# Patient Record
Sex: Female | Born: 1955 | Race: Black or African American | Hispanic: No | Marital: Married | Smoking: Never smoker
Health system: Southern US, Community
[De-identification: ages and names within clinical notes are randomized; demographics above are authoritative.]

## PROBLEM LIST (undated history)

## (undated) DIAGNOSIS — R350 Frequency of micturition: Secondary | ICD-10-CM

## (undated) DIAGNOSIS — N898 Other specified noninflammatory disorders of vagina: Secondary | ICD-10-CM

## (undated) DIAGNOSIS — D219 Benign neoplasm of connective and other soft tissue, unspecified: Secondary | ICD-10-CM

## (undated) DIAGNOSIS — R21 Rash and other nonspecific skin eruption: Secondary | ICD-10-CM

## (undated) DIAGNOSIS — I10 Essential (primary) hypertension: Secondary | ICD-10-CM

## (undated) HISTORY — DX: Other specified noninflammatory disorders of vagina: N89.8

## (undated) HISTORY — PX: HYSTEROSCOPY: SHX211

## (undated) HISTORY — DX: Benign neoplasm of connective and other soft tissue, unspecified: D21.9

---

## 1997-08-19 ENCOUNTER — Ambulatory Visit (HOSPITAL_COMMUNITY): Admission: RE | Admit: 1997-08-19 | Discharge: 1997-08-19 | Payer: Self-pay | Admitting: Obstetrics & Gynecology

## 1997-08-26 ENCOUNTER — Other Ambulatory Visit: Admission: RE | Admit: 1997-08-26 | Discharge: 1997-08-26 | Payer: Self-pay | Admitting: Obstetrics & Gynecology

## 1998-08-15 ENCOUNTER — Encounter: Payer: Self-pay | Admitting: Obstetrics & Gynecology

## 1998-08-15 ENCOUNTER — Ambulatory Visit (HOSPITAL_COMMUNITY): Admission: RE | Admit: 1998-08-15 | Discharge: 1998-08-15 | Payer: Self-pay | Admitting: Obstetrics & Gynecology

## 1998-10-16 ENCOUNTER — Other Ambulatory Visit: Admission: RE | Admit: 1998-10-16 | Discharge: 1998-10-16 | Payer: Self-pay | Admitting: Obstetrics & Gynecology

## 1998-11-24 ENCOUNTER — Encounter (HOSPITAL_BASED_OUTPATIENT_CLINIC_OR_DEPARTMENT_OTHER): Payer: Self-pay | Admitting: General Surgery

## 1998-11-26 ENCOUNTER — Ambulatory Visit (HOSPITAL_COMMUNITY): Admission: RE | Admit: 1998-11-26 | Discharge: 1998-11-27 | Payer: Self-pay | Admitting: General Surgery

## 1998-11-26 ENCOUNTER — Encounter (INDEPENDENT_AMBULATORY_CARE_PROVIDER_SITE_OTHER): Payer: Self-pay | Admitting: *Deleted

## 1998-11-26 ENCOUNTER — Encounter (HOSPITAL_BASED_OUTPATIENT_CLINIC_OR_DEPARTMENT_OTHER): Payer: Self-pay | Admitting: General Surgery

## 1999-01-26 HISTORY — PX: CHOLECYSTECTOMY: SHX55

## 1999-08-14 ENCOUNTER — Ambulatory Visit (HOSPITAL_COMMUNITY): Admission: RE | Admit: 1999-08-14 | Discharge: 1999-08-14 | Payer: Self-pay | Admitting: Obstetrics & Gynecology

## 1999-08-14 ENCOUNTER — Encounter: Payer: Self-pay | Admitting: Obstetrics & Gynecology

## 1999-10-30 ENCOUNTER — Other Ambulatory Visit: Admission: RE | Admit: 1999-10-30 | Discharge: 1999-10-30 | Payer: Self-pay | Admitting: Obstetrics & Gynecology

## 2000-08-15 ENCOUNTER — Ambulatory Visit (HOSPITAL_COMMUNITY): Admission: RE | Admit: 2000-08-15 | Discharge: 2000-08-15 | Payer: Self-pay | Admitting: Obstetrics and Gynecology

## 2000-08-15 ENCOUNTER — Encounter: Payer: Self-pay | Admitting: Obstetrics and Gynecology

## 2000-10-04 ENCOUNTER — Other Ambulatory Visit: Admission: RE | Admit: 2000-10-04 | Discharge: 2000-10-04 | Payer: Self-pay | Admitting: Obstetrics and Gynecology

## 2001-08-16 ENCOUNTER — Ambulatory Visit (HOSPITAL_COMMUNITY): Admission: RE | Admit: 2001-08-16 | Discharge: 2001-08-16 | Payer: Self-pay | Admitting: Obstetrics and Gynecology

## 2001-08-16 ENCOUNTER — Encounter: Payer: Self-pay | Admitting: Obstetrics and Gynecology

## 2001-11-20 ENCOUNTER — Other Ambulatory Visit: Admission: RE | Admit: 2001-11-20 | Discharge: 2001-11-20 | Payer: Self-pay | Admitting: Obstetrics and Gynecology

## 2002-08-21 ENCOUNTER — Ambulatory Visit (HOSPITAL_COMMUNITY): Admission: RE | Admit: 2002-08-21 | Discharge: 2002-08-21 | Payer: Self-pay | Admitting: Obstetrics and Gynecology

## 2002-08-21 ENCOUNTER — Encounter: Payer: Self-pay | Admitting: Obstetrics and Gynecology

## 2002-12-11 ENCOUNTER — Other Ambulatory Visit: Admission: RE | Admit: 2002-12-11 | Discharge: 2002-12-11 | Payer: Self-pay | Admitting: Obstetrics and Gynecology

## 2003-08-23 ENCOUNTER — Ambulatory Visit (HOSPITAL_COMMUNITY): Admission: RE | Admit: 2003-08-23 | Discharge: 2003-08-23 | Payer: Self-pay | Admitting: Obstetrics and Gynecology

## 2004-01-03 ENCOUNTER — Other Ambulatory Visit: Admission: RE | Admit: 2004-01-03 | Discharge: 2004-01-03 | Payer: Self-pay | Admitting: Obstetrics and Gynecology

## 2004-01-26 HISTORY — PX: COLONOSCOPY: SHX174

## 2004-08-26 ENCOUNTER — Ambulatory Visit (HOSPITAL_COMMUNITY): Admission: RE | Admit: 2004-08-26 | Discharge: 2004-08-26 | Payer: Self-pay | Admitting: Obstetrics and Gynecology

## 2005-01-05 ENCOUNTER — Other Ambulatory Visit: Admission: RE | Admit: 2005-01-05 | Discharge: 2005-01-05 | Payer: Self-pay | Admitting: Obstetrics and Gynecology

## 2005-08-27 ENCOUNTER — Ambulatory Visit (HOSPITAL_COMMUNITY): Admission: RE | Admit: 2005-08-27 | Discharge: 2005-08-27 | Payer: Self-pay | Admitting: Obstetrics and Gynecology

## 2006-08-31 ENCOUNTER — Ambulatory Visit (HOSPITAL_COMMUNITY): Admission: RE | Admit: 2006-08-31 | Discharge: 2006-08-31 | Payer: Self-pay | Admitting: Obstetrics and Gynecology

## 2007-01-28 ENCOUNTER — Emergency Department (HOSPITAL_COMMUNITY): Admission: EM | Admit: 2007-01-28 | Discharge: 2007-01-28 | Payer: Self-pay | Admitting: Family Medicine

## 2007-09-05 ENCOUNTER — Ambulatory Visit (HOSPITAL_COMMUNITY): Admission: RE | Admit: 2007-09-05 | Discharge: 2007-09-05 | Payer: Self-pay | Admitting: Obstetrics and Gynecology

## 2008-09-10 ENCOUNTER — Ambulatory Visit (HOSPITAL_COMMUNITY): Admission: RE | Admit: 2008-09-10 | Discharge: 2008-09-10 | Payer: Self-pay | Admitting: Family Medicine

## 2008-09-13 ENCOUNTER — Encounter: Admission: RE | Admit: 2008-09-13 | Discharge: 2008-09-13 | Payer: Self-pay | Admitting: Family Medicine

## 2009-01-21 ENCOUNTER — Ambulatory Visit: Payer: Self-pay | Admitting: Vascular Surgery

## 2009-01-21 ENCOUNTER — Ambulatory Visit: Admission: RE | Admit: 2009-01-21 | Discharge: 2009-01-21 | Payer: Self-pay | Admitting: Obstetrics and Gynecology

## 2009-01-21 ENCOUNTER — Encounter (INDEPENDENT_AMBULATORY_CARE_PROVIDER_SITE_OTHER): Payer: Self-pay | Admitting: Obstetrics and Gynecology

## 2009-01-25 HISTORY — PX: TUMOR REMOVAL: SHX12

## 2009-03-19 ENCOUNTER — Ambulatory Visit (HOSPITAL_COMMUNITY): Admission: RE | Admit: 2009-03-19 | Discharge: 2009-03-19 | Payer: Self-pay | Admitting: Obstetrics and Gynecology

## 2009-09-15 ENCOUNTER — Ambulatory Visit (HOSPITAL_COMMUNITY): Admission: RE | Admit: 2009-09-15 | Discharge: 2009-09-15 | Payer: Self-pay | Admitting: Family Medicine

## 2010-02-16 ENCOUNTER — Encounter: Payer: Self-pay | Admitting: Family Medicine

## 2010-04-15 LAB — BASIC METABOLIC PANEL
CO2: 28 mEq/L (ref 19–32)
Calcium: 9.5 mg/dL (ref 8.4–10.5)
Chloride: 102 mEq/L (ref 96–112)

## 2010-04-15 LAB — CBC: RDW: 13.5 % (ref 11.5–15.5)

## 2010-08-27 ENCOUNTER — Other Ambulatory Visit (HOSPITAL_COMMUNITY): Payer: Self-pay | Admitting: Obstetrics and Gynecology

## 2010-08-27 DIAGNOSIS — Z1231 Encounter for screening mammogram for malignant neoplasm of breast: Secondary | ICD-10-CM

## 2010-09-17 ENCOUNTER — Ambulatory Visit (HOSPITAL_COMMUNITY): Payer: Self-pay

## 2010-09-22 ENCOUNTER — Ambulatory Visit (HOSPITAL_COMMUNITY)
Admission: RE | Admit: 2010-09-22 | Discharge: 2010-09-22 | Disposition: A | Discharge status: Home / Self Care | Payer: Managed Care, Other (non HMO) | Source: Ambulatory Visit | Attending: Obstetrics and Gynecology | Admitting: Obstetrics and Gynecology

## 2010-09-22 DIAGNOSIS — Z1231 Encounter for screening mammogram for malignant neoplasm of breast: Secondary | ICD-10-CM | POA: Insufficient documentation

## 2010-10-15 LAB — CULTURE, ROUTINE-ABSCESS

## 2010-12-25 ENCOUNTER — Encounter (INDEPENDENT_AMBULATORY_CARE_PROVIDER_SITE_OTHER): Payer: Self-pay | Admitting: Surgery

## 2010-12-28 ENCOUNTER — Ambulatory Visit (INDEPENDENT_AMBULATORY_CARE_PROVIDER_SITE_OTHER): Payer: Self-pay | Admitting: General Surgery

## 2010-12-28 ENCOUNTER — Encounter (INDEPENDENT_AMBULATORY_CARE_PROVIDER_SITE_OTHER): Payer: Self-pay | Admitting: Surgery

## 2010-12-28 ENCOUNTER — Ambulatory Visit (INDEPENDENT_AMBULATORY_CARE_PROVIDER_SITE_OTHER): Payer: Managed Care, Other (non HMO) | Admitting: Surgery

## 2010-12-28 VITALS — BP 120/78 | HR 68 | Temp 98.2°F | Resp 18 | Ht 62.0 in | Wt 200.8 lb

## 2010-12-28 DIAGNOSIS — D1779 Benign lipomatous neoplasm of other sites: Secondary | ICD-10-CM

## 2010-12-28 DIAGNOSIS — D1720 Benign lipomatous neoplasm of skin and subcutaneous tissue of unspecified limb: Secondary | ICD-10-CM | POA: Insufficient documentation

## 2010-12-28 DIAGNOSIS — D171 Benign lipomatous neoplasm of skin and subcutaneous tissue of trunk: Secondary | ICD-10-CM | POA: Insufficient documentation

## 2010-12-28 NOTE — Progress Notes (Signed)
Patient ID: Chelsea Landry, female   DOB: 03-31-1955, 55 y.o.   MRN: [id number]  Chief Complaint  Patient presents with  . Lipoma    back and both wrist    HPI Chelsea Landry is a 55 y.o. female.  Referred by Dr. Catha Gosselin for evaluation of lipomas on both forearms and back HPI 55 yo female in good health who presents with a slowly enlarging masses on both forearms on the dorsal surfaces just above the wrist. This has been present for at least 3 years an enlarging very slowly. No pain is associated with these at this time. She also has noticed a small mass on her mid back for the last year. This is also enlarged slightly. She is referred for surgical evaluation. PMH: Hypertension Past Surgical History  Procedure Date  . Cholecystectomy 2001  . 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 No pertinent family history.  Social History History  Substance Use Topics  . Smoking status: Never Smoker   . Smokeless tobacco: Not on file  . Alcohol Use: No    No Known Allergies  Current Outpatient Prescriptions  Medication Sig Dispense Refill  . losartan-hydrochlorothiazide (HYZAAR) 50-12.5 MG per tablet         Review of Systems Review of Systems ROS otherwise negative Blood pressure 120/78, pulse 68, temperature 98.2 F (36.8 C), resp. rate 18, height 5\' 2"  (1.575 m), weight 200 lb 12.8 oz (91.082 kg).  Physical Exam Physical Exam WDWN in NAD Both forearms - 4x3 cm subcutaneous masses on dorsal surfaces, just proximal to wrists.  Symmetric.  Well-demarcated. No tenderness. 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  The patient prefers to leave these alone for now, which is very reasonable.  She will call us as needed if these enlarge or become uncomfortable.         Kynslei Art K. 12/28/2010, 2:12 PM

## 2010-12-28 NOTE — Patient Instructions (Signed)
If these enlarge or become uncomfortable, please call us for another appointment and we will discuss removal.

## 2010-12-30 ENCOUNTER — Other Ambulatory Visit: Payer: Self-pay

## 2010-12-30 ENCOUNTER — Encounter (HOSPITAL_BASED_OUTPATIENT_CLINIC_OR_DEPARTMENT_OTHER): Payer: Self-pay | Admitting: *Deleted

## 2010-12-30 ENCOUNTER — Emergency Department (HOSPITAL_BASED_OUTPATIENT_CLINIC_OR_DEPARTMENT_OTHER)
Admission: EM | Admit: 2010-12-30 | Discharge: 2010-12-30 | Disposition: A | Discharge status: Home / Self Care | Payer: Managed Care, Other (non HMO) | Attending: Emergency Medicine | Admitting: Emergency Medicine

## 2010-12-30 DIAGNOSIS — I10 Essential (primary) hypertension: Secondary | ICD-10-CM | POA: Insufficient documentation

## 2010-12-30 DIAGNOSIS — R42 Dizziness and giddiness: Secondary | ICD-10-CM

## 2010-12-30 DIAGNOSIS — E876 Hypokalemia: Secondary | ICD-10-CM

## 2010-12-30 DIAGNOSIS — R55 Syncope and collapse: Secondary | ICD-10-CM

## 2010-12-30 HISTORY — DX: Essential (primary) hypertension: I10

## 2010-12-30 LAB — CBC
Hemoglobin: 12.7 g/dL (ref 12.0–15.0)
MCV: 87.9 fL (ref 78.0–100.0)
Platelets: 249 10*3/uL (ref 150–400)
RBC: 4.28 MIL/uL (ref 3.87–5.11)
WBC: 15.7 10*3/uL — ABNORMAL HIGH (ref 4.0–10.5)

## 2010-12-30 LAB — BASIC METABOLIC PANEL
Calcium: 9 mg/dL (ref 8.4–10.5)
Creatinine, Ser: 0.9 mg/dL (ref 0.50–1.10)
GFR calc non Af Amer: 70 mL/min — ABNORMAL LOW (ref >90)
Glucose, Bld: 141 mg/dL — ABNORMAL HIGH (ref 70–99)
Potassium: 3.1 mEq/L — ABNORMAL LOW (ref 3.5–5.1)

## 2010-12-30 MED ORDER — POTASSIUM CHLORIDE CRYS ER 20 MEQ PO TBCR
40.0000 meq | EXTENDED_RELEASE_TABLET | Freq: Once | ORAL | Status: AC
  Administered 2010-12-30: 40 meq via ORAL
  Filled 2010-12-30: qty 2

## 2010-12-30 MED ORDER — POTASSIUM CHLORIDE ER 10 MEQ PO TBCR: 10.0000 meq | EXTENDED_RELEASE_TABLET | Freq: Two times a day (BID) | ORAL | Status: DC

## 2010-12-30 NOTE — ED Notes (Signed)
Pt to room 5 by ems via stretcher. Pt reports giving blood today at noon, drank a small cup of juice afterwards, but started to feel dizzy when she got back to work. ems reports pt was diaphoretic and pale on their arrival. Iv access obtained by ems, 500cc bolus fluids given by ems, pt states she is no longer dizzy, but feels weak and "my ears are ringing...".

## 2010-12-30 NOTE — ED Notes (Signed)
Patient states she gave blood this morning at 11:30 A.M. Patient is sluggish and weak during time I took vitals.

## 2010-12-30 NOTE — ED Provider Notes (Signed)
History     CSN: [id number] Arrival date & time: 12/30/2010  1:54 PM   First MD Initiated Contact with Patient 12/30/10 1455      Chief Complaint  Patient presents with  . Dizziness   patient brought in by EMS. Gave blood today at noon and had some dizziness afterwards. IV access obtained by EMS. 500 cc bolus given. Apparently, was feeling better after arrival.  Patient reported a heavy feeling in her legs. She had no chest pain or palpitations. No abdominal pain or back pain. She denied any vomiting or any focal weakness. She states she has given blood before. Family's describes a near syncopal episode. She's had no new medications. No recent illnesses. She also had some ringing in her ear, but feels much better at this time. Vital signs were stable upon arrival  (Consider location/radiation/quality/duration/timing/severity/associated sxs/prior treatment) HPI  Past Medical History  Diagnosis Date  . Hypertension     Past Surgical History  Procedure Date  . Cholecystectomy 2001  . 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 No pertinent family history.  History  Substance Use Topics  . Smoking status: Never Smoker   . Smokeless tobacco: Not on file  . Alcohol Use: No    OB History    Grav Para Term Preterm Abortions TAB SAB Ect Mult Living                  Review of Systems  All other systems reviewed and are negative.    Allergies  Review of patient's allergies indicates no known allergies.  Home Medications   Current Outpatient Rx  Name Route Sig Dispense Refill  . CALCIUM CITRATE-VITAMIN D 315-200 MG-UNIT PO TABS Oral Take 1 tablet by mouth daily.      Marland Kitchen LOSARTAN POTASSIUM-HCTZ 50-12.5 MG PO TABS Oral Take 1 tablet by mouth daily.      Marland Kitchen ONE-DAILY MULTI VITAMINS PO TABS Oral Take 1 tablet by mouth daily.      Marland Kitchen NAPROXEN SODIUM 220 MG PO TABS Oral Take 440 mg by mouth 2 (two) times daily as needed. For pain     . LOSARTAN POTASSIUM-HCTZ  50-12.5 MG PO TABS        BP 111/65  Pulse 71  Temp(Src) 97.7 F (36.5 C) (Oral)  Resp 20  Ht 5\' 2"  (1.575 m)  Wt 200 lb (90.719 kg)  BMI 36.58 kg/m2  SpO2 100%  Physical Exam  Nursing note and vitals reviewed. Constitutional: She is oriented to person, place, and time. She appears well-developed and well-nourished.  HENT:  Head: Normocephalic and atraumatic.  Eyes: Conjunctivae and EOM are normal. Pupils are equal, round, and reactive to light.  Neck: Neck supple.  Cardiovascular: Normal rate and regular rhythm.  Exam reveals no gallop and no friction rub.   No murmur heard. Pulmonary/Chest: Breath sounds normal. She has no wheezes. She has no rales. She exhibits no tenderness.  Abdominal: Soft. Bowel sounds are normal. She exhibits no distension. There is no tenderness. There is no rebound and no guarding.  Musculoskeletal: Normal range of motion.  Neurological: She is alert and oriented to person, place, and time. She has normal reflexes. No cranial nerve deficit. Coordination normal.       Awake alert, and oriented. Grip strength equal and symmetric. No pronator drift. No facial droop.  Skin: Skin is warm and dry. No rash noted.  Psychiatric: She has a normal mood and affect.  ED Course  Procedures (including critical care time)  Labs Reviewed  CBC - Abnormal; Notable for the following:    WBC 15.7 (*)    All other components within normal limits  BASIC METABOLIC PANEL - Abnormal; Notable for the following:    Potassium 3.1 (*)    Glucose, Bld 141 (*)    GFR calc non Af Amer 70 (*)    GFR calc Af Amer 81 (*)    All other components within normal limits   No results found.   No diagnosis found.    MDM  Pt is seen and examined;  Initial history and physical completed.  Will follow.    4:10 PM Dizziness and near syncopal episode after giving blood. Today. No focal deficits, neuro exam is normal. Likely vasovagal reaction. Will check EKG, CBC, basic  metabolic panel, and observe the patient on the cardiac monitor.    4:10 PM Results for orders placed during the hospital encounter of 12/30/10  CBC      Component Value Range   WBC 15.7 (*) 4.0 - 10.5 (K/uL)   RBC 4.28  3.87 - 5.11 (MIL/uL)   Hemoglobin 12.7  12.0 - 15.0 (g/dL)   HCT 78.2  95.6 - 21.3 (%)   MCV 87.9  78.0 - 100.0 (fL)   MCH 29.7  26.0 - 34.0 (pg)   MCHC 33.8  30.0 - 36.0 (g/dL)   RDW 08.6  57.8 - 46.9 (%)   Platelets 249  150 - 400 (K/uL)  BASIC METABOLIC PANEL      Component Value Range   Sodium 139  135 - 145 (mEq/L)   Potassium 3.1 (*) 3.5 - 5.1 (mEq/L)   Chloride 101  96 - 112 (mEq/L)   CO2 26  19 - 32 (mEq/L)   Glucose, Bld 141 (*) 70 - 99 (mg/dL)   BUN 15  6 - 23 (mg/dL)   Creatinine, Ser 6.29  0.50 - 1.10 (mg/dL)   Calcium 9.0  8.4 - 52.8 (mg/dL)   GFR calc non Af Amer 70 (*) >90 (mL/min)   GFR calc Af Amer 81 (*) >90 (mL/min)   No results found.   Date: 12/30/2010  Rate: 80  Rhythm: normal sinus rhythm  QRS Axis: normal  Intervals: normal  ST/T Wave abnormalities: nonspecific T wave changes  Conduction Disutrbances:none  Narrative Interpretation:   Old EKG Reviewed: unchanged Comp. To Nov 24, 1998 No significant changes  Results for orders placed during the hospital encounter of 12/30/10  CBC      Component Value Range   WBC 15.7 (*) 4.0 - 10.5 (K/uL)   RBC 4.28  3.87 - 5.11 (MIL/uL)   Hemoglobin 12.7  12.0 - 15.0 (g/dL)   HCT 41.3  24.4 - 01.0 (%)   MCV 87.9  78.0 - 100.0 (fL)   MCH 29.7  26.0 - 34.0 (pg)   MCHC 33.8  30.0 - 36.0 (g/dL)   RDW 27.2  53.6 - 64.4 (%)   Platelets 249  150 - 400 (K/uL)  BASIC METABOLIC PANEL      Component Value Range   Sodium 139  135 - 145 (mEq/L)   Potassium 3.1 (*) 3.5 - 5.1 (mEq/L)   Chloride 101  96 - 112 (mEq/L)   CO2 26  19 - 32 (mEq/L)   Glucose, Bld 141 (*) 70 - 99 (mg/dL)   BUN 15  6 - 23 (mg/dL)   Creatinine, Ser 0.34  0.50 - 1.10 (mg/dL)  Calcium 9.0  8.4 - 10.5 (mg/dL)   GFR calc  non Af Amer 70 (*) >90 (mL/min)   GFR calc Af Amer 81 (*) >90 (mL/min)   No results found.    Merle Whitehorn A. Patrica Duel, MD 12/30/10 [phone]

## 2010-12-30 NOTE — ED Notes (Signed)
I took second set of vitals, then took ecg, gave new and old copy to Dr. Patrica Duel. I then placed patient on monitor. Family, friends, coworkers are all crowded into room making tests and interventions difficult.

## 2011-03-10 ENCOUNTER — Ambulatory Visit (INDEPENDENT_AMBULATORY_CARE_PROVIDER_SITE_OTHER): Payer: Managed Care, Other (non HMO) | Admitting: General Surgery

## 2011-03-10 ENCOUNTER — Encounter (INDEPENDENT_AMBULATORY_CARE_PROVIDER_SITE_OTHER): Payer: Self-pay | Admitting: General Surgery

## 2011-03-10 VITALS — BP 134/92 | HR 72 | Temp 97.9°F | Resp 18 | Ht 62.0 in | Wt 200.4 lb

## 2011-03-10 DIAGNOSIS — E669 Obesity, unspecified: Secondary | ICD-10-CM

## 2011-03-10 NOTE — Progress Notes (Addendum)
Patient ID: Chelsea Landry, female   DOB: 30-Mar-1955, 56 y.o.   MRN: [id number]  Chief Complaint  Patient presents with  . New Evaluation    Lap band initial    HPI Chelsea Landry is a 56 y.o. female.  This patient presents for an initial weight loss surgery evaluation. She has a  BMI of 36 with hypertension and prediabetes. She states that she has struggled with her weight since the birth of her child and she states that she never lost her baby weight. She has tried several diets and was enrolled in a weight loss program at [facility] program but she states that this "didn't feel right" she is not really exercising. She denies any reflux. HPI  Past Medical History  Diagnosis Date  . Hypertension   . Wears glasses   . Menopause     Past Surgical History  Procedure Date  . 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 Cholecystectomy 2001    Family History  Problem Relation Age of Onset  . Leukemia Mother   . Aneurysm Father   . Cancer Brother     brain    Social History History  Substance Use Topics  . Smoking status: Never Smoker   . Smokeless tobacco: Never Used  . Alcohol Use: No    No Known Allergies  Current Outpatient Prescriptions  Medication Sig Dispense Refill  . calcium citrate-vitamin D (CITRACAL+D) 315-200 MG-UNIT per tablet Take 1 tablet by mouth daily.        Marland Kitchen losartan-hydrochlorothiazide (HYZAAR) 50-12.5 MG per tablet Take 1 tablet by mouth daily.        . Multiple Vitamin (MULTIVITAMIN) tablet Take 1 tablet by mouth daily.          Review of Systems Review of Systems All other review of systems negative or noncontributory except as stated in the HPI  Blood pressure 134/92, pulse 72, temperature 97.9 F (36.6 C), temperature source Temporal, resp. rate 18, height 5\' 2"  (1.575 m), weight 200 lb 6.4 oz (90.901 kg).  Physical Exam Physical Exam Physical Exam  Nursing note and vitals reviewed. Constitutional: She is oriented to person, place, and  time. She appears well-developed and well-nourished. No distress.  HENT:  Head: Normocephalic and atraumatic.  Mouth/Throat: No oropharyngeal exudate.  Eyes: Conjunctivae and EOM are normal. Pupils are equal, round, and reactive to light. Right eye exhibits no discharge. Left eye exhibits no discharge. No scleral icterus.  Neck: Normal range of motion. Neck supple. No tracheal deviation present.  Cardiovascular: Normal rate, regular rhythm, normal heart sounds and intact distal pulses.   Pulmonary/Chest: Effort normal and breath sounds normal. No stridor. No respiratory distress. She has no wheezes.  Abdominal: Soft. Bowel sounds are normal. She exhibits no distension and no mass. There is no tenderness. There is no rebound and no guarding.  Musculoskeletal: Normal range of motion. She exhibits no edema and no tenderness.  Neurological: She is alert and oriented to person, place, and time.  Skin: Skin is warm and dry. No rash noted. She is not diaphoretic. No erythema. No pallor.  Psychiatric: She has a normal mood and affect. Her behavior is normal. Judgment and thought content normal.    Data Reviewed   Assessment    Morbid obesity with a BMI of 36 and hypertension and prediabetes We had a long discussion regarding all of the weight loss surgery option  including continued medical weight loss and surgical options of a  lap band, the sleeve gastrectomy, and Roux-en-Y gastric bypass. We discussed the pros and cons of each procedure as well as there perioperative management. We discussed the risks. She is unsure of which procedures that she is interested in having and I recommended that she go home and do some additional research on each of the procedures and she can only back when she decides what she would like to proceed with. I think that she be a fine candidate for any of the procedures.  At least 45 minutes was spent counseling this patient.    Plan    She is going to researched the  surgical procedures and she will call back when she decides which way she would like to go.       Lodema Pilot DAVID 03/10/2011, 4:45 PM   She called back and is now interested in the sleeve gastrectomy.  Will proceed with this workup.

## 2011-03-26 ENCOUNTER — Other Ambulatory Visit (INDEPENDENT_AMBULATORY_CARE_PROVIDER_SITE_OTHER): Payer: Self-pay | Admitting: General Surgery

## 2011-03-31 ENCOUNTER — Ambulatory Visit (HOSPITAL_COMMUNITY): Payer: Managed Care, Other (non HMO)

## 2011-03-31 ENCOUNTER — Ambulatory Visit (HOSPITAL_COMMUNITY)
Admission: RE | Admit: 2011-03-31 | Discharge: 2011-03-31 | Disposition: A | Discharge status: Home / Self Care | Payer: Managed Care, Other (non HMO) | Source: Ambulatory Visit | Attending: General Surgery | Admitting: General Surgery

## 2011-03-31 DIAGNOSIS — K449 Diaphragmatic hernia without obstruction or gangrene: Secondary | ICD-10-CM | POA: Insufficient documentation

## 2011-03-31 DIAGNOSIS — I10 Essential (primary) hypertension: Secondary | ICD-10-CM | POA: Insufficient documentation

## 2011-04-07 ENCOUNTER — Encounter: Payer: Managed Care, Other (non HMO) | Attending: General Surgery | Admitting: *Deleted

## 2011-04-07 ENCOUNTER — Encounter: Payer: Self-pay | Admitting: *Deleted

## 2011-04-07 DIAGNOSIS — Z01818 Encounter for other preprocedural examination: Secondary | ICD-10-CM | POA: Insufficient documentation

## 2011-04-07 DIAGNOSIS — Z713 Dietary counseling and surveillance: Secondary | ICD-10-CM | POA: Insufficient documentation

## 2011-04-07 NOTE — Progress Notes (Signed)
  Pre-Op Assessment Visit:  Pre-Operative LAGB or Gastric Sleeve Surgery  Medical Nutrition Therapy:  Appt start time: 0830 end time:  0930.  Patient was seen on 04/07/2011 for Pre-Operative LAGB or Gastric Sleeve Nutrition Assessment. Assessment and letter of approval faxed to [facility] Bariatric Surgery Program coordinator on 04/07/2011.  Approval letter sent to [facility] and will be available in the chart under the media tab.  TANITA  BODY COMP RESULTS  04/07/11     %Fat 49.6%     FM (lbs) 98.5     FFM (lbs) 100.5     TBW (lbs) 73.5      Handouts given during visit include:  Pre-Op Goals handout  Bariatric Surgery Protein Shakes handout  Bariatric Support Group Calendar  Samples Dispersed:   Celebrate MVI [facility]): 1 ea Lot #: [id number]; Exp: 03/2012   Celebrate Calcium Plus (Cherry Tart): 1 ea Lot #: [id number]; Exp: 12/2011  Patient to call for Pre-Op and Post-Op Nutrition Education at [facility] when surgery is scheduled.

## 2011-04-07 NOTE — Patient Instructions (Addendum)
   Follow Pre-Op Nutrition Goals to prepare for Lapband/Gastric Sleeve Surgery.   Call the [facility] at [phone] once you have been given your surgery date to enrolled in the Pre-Op Nutrition Class. You will need to attend this nutrition class 3-4 weeks prior to your surgery.

## 2011-04-15 ENCOUNTER — Ambulatory Visit (HOSPITAL_COMMUNITY): Payer: Managed Care, Other (non HMO)

## 2011-04-21 ENCOUNTER — Ambulatory Visit: Payer: Managed Care, Other (non HMO) | Admitting: *Deleted

## 2011-05-05 ENCOUNTER — Other Ambulatory Visit (INDEPENDENT_AMBULATORY_CARE_PROVIDER_SITE_OTHER): Payer: Self-pay

## 2011-05-06 ENCOUNTER — Telehealth (INDEPENDENT_AMBULATORY_CARE_PROVIDER_SITE_OTHER): Payer: Self-pay

## 2011-05-06 ENCOUNTER — Other Ambulatory Visit (INDEPENDENT_AMBULATORY_CARE_PROVIDER_SITE_OTHER): Payer: Self-pay

## 2011-05-06 DIAGNOSIS — I10 Essential (primary) hypertension: Secondary | ICD-10-CM

## 2011-05-06 NOTE — Telephone Encounter (Signed)
Mailed lab orders to patient for Bariatric Initial ([address] [place], [place] [address])

## 2011-05-14 ENCOUNTER — Other Ambulatory Visit (INDEPENDENT_AMBULATORY_CARE_PROVIDER_SITE_OTHER): Payer: Self-pay | Admitting: General Surgery

## 2011-05-15 LAB — TSH: TSH: 1.814 u[IU]/mL (ref 0.350–4.500)

## 2011-05-15 LAB — CBC WITH DIFFERENTIAL/PLATELET
Basophils Absolute: 0 10*3/uL (ref 0.0–0.1)
Basophils Relative: 0 % (ref 0–1)
Eosinophils Absolute: 0.2 10*3/uL (ref 0.0–0.7)
Eosinophils Relative: 2 % (ref 0–5)
Lymphocytes Relative: 26 % (ref 12–46)
MCH: 28.2 pg (ref 26.0–34.0)
MCV: 87.7 fL (ref 78.0–100.0)
Platelets: 296 10*3/uL (ref 150–400)
RDW: 14 % (ref 11.5–15.5)
WBC: 7.4 10*3/uL (ref 4.0–10.5)

## 2011-05-15 LAB — COMPREHENSIVE METABOLIC PANEL
ALT: 31 U/L (ref 0–35)
AST: 30 U/L (ref 0–37)
Creat: 0.79 mg/dL (ref 0.50–1.10)
Total Bilirubin: 0.3 mg/dL (ref 0.3–1.2)

## 2011-05-15 LAB — LIPID PANEL
Total CHOL/HDL Ratio: 3.9 Ratio
VLDL: 30 mg/dL (ref 0–40)

## 2011-05-15 LAB — T4: T4, Total: 4.2 ug/dL — ABNORMAL LOW (ref 5.0–12.5)

## 2011-09-13 ENCOUNTER — Other Ambulatory Visit: Payer: Self-pay | Admitting: Obstetrics and Gynecology

## 2011-09-13 DIAGNOSIS — Z1231 Encounter for screening mammogram for malignant neoplasm of breast: Secondary | ICD-10-CM

## 2011-09-28 ENCOUNTER — Ambulatory Visit (HOSPITAL_COMMUNITY)
Admission: RE | Admit: 2011-09-28 | Discharge: 2011-09-28 | Disposition: A | Discharge status: Home / Self Care | Payer: Managed Care, Other (non HMO) | Source: Ambulatory Visit | Attending: Obstetrics and Gynecology | Admitting: Obstetrics and Gynecology

## 2011-09-28 DIAGNOSIS — Z1231 Encounter for screening mammogram for malignant neoplasm of breast: Secondary | ICD-10-CM

## 2011-10-07 ENCOUNTER — Encounter: Payer: Self-pay | Admitting: Obstetrics and Gynecology

## 2011-10-13 ENCOUNTER — Telehealth: Payer: Self-pay | Admitting: Obstetrics and Gynecology

## 2011-10-13 NOTE — Telephone Encounter (Signed)
Triage/referral req

## 2011-10-15 ENCOUNTER — Telehealth: Payer: Self-pay | Admitting: Obstetrics and Gynecology

## 2011-10-15 NOTE — Telephone Encounter (Signed)
TC to pt. LM to return call.  

## 2011-10-15 NOTE — Telephone Encounter (Signed)
Message copied by Mason Jim on Fri Oct 15, 2011  3:11 PM ------      Message from: Jaymes Graff      Created: Fri Oct 15, 2011 12:12 PM      Regarding: RE: Dense Breast Letter       She does not need an MRI.  Yes she can be referred to the informational session      ----- Message -----         From: Constance Haw, RN         Sent: 10/13/2011   4:44 PM           To: Michael Litter, MD      Subject: Dense Breast Letter                                      Pt received a dense breast letter and is questioning whether needs MRI.  Dr AVS has informational session scheduled in Oct.  Would you like her to be referred to that? If not, what information would you like me to tell pt.   Thanks, Harriett Sine

## 2011-10-18 NOTE — Telephone Encounter (Signed)
Returned pt's call.  Per DR ND, informed does not need MRI. Offered to schedule with DR AVS for informational class to discuss dense breasts.  Pt will call to schedule if availalble.

## 2011-10-25 ENCOUNTER — Telehealth: Payer: Self-pay | Admitting: Obstetrics and Gynecology

## 2011-10-25 NOTE — Telephone Encounter (Signed)
TC from pt. States unable to attend information session 10/28/11 about dense breasts but would like to attend next session. Name and number given to [facility] to call when available.  Pt agreeable.

## 2011-12-20 ENCOUNTER — Encounter: Payer: Self-pay | Admitting: Obstetrics and Gynecology

## 2011-12-20 ENCOUNTER — Ambulatory Visit (INDEPENDENT_AMBULATORY_CARE_PROVIDER_SITE_OTHER): Payer: Managed Care, Other (non HMO) | Admitting: Obstetrics and Gynecology

## 2011-12-20 VITALS — BP 124/80 | Ht 62.0 in | Wt 197.0 lb

## 2011-12-20 DIAGNOSIS — Z0000 Encounter for general adult medical examination without abnormal findings: Secondary | ICD-10-CM

## 2011-12-20 DIAGNOSIS — M79609 Pain in unspecified limb: Secondary | ICD-10-CM

## 2011-12-20 DIAGNOSIS — Z01419 Encounter for gynecological examination (general) (routine) without abnormal findings: Secondary | ICD-10-CM

## 2011-12-20 DIAGNOSIS — M79606 Pain in leg, unspecified: Secondary | ICD-10-CM

## 2011-12-20 NOTE — Progress Notes (Signed)
Last Pap: 2011 pap due 2014 WNL: Yes Regular Periods:no Contraception: b/a  Monthly Breast exam:yes Tetanus<37yrs:yes Nl.Bladder Function:yes Daily BMs:yes Healthy Diet:yes Calcium:yes Mammogram:yes Date of Mammogram: 2013 wnl per pt Exercise:yes Have often Exercise: occ Seatbelt: yes Abuse at home: no Stressful work:no Sigmoid-colonoscopy: 17yrs ago wnl per  pt Bone Density: Yes wnl per pt PCP: Dr.Little Change in PMH: no change  Change in [facility]: no change BP 124/80  Ht 5\' 2"  (1.575 m)  Wt 197 lb (89.359 kg)  BMI 36.03 kg/m2 Pt c/o right leg pain for about 4-6 months.  She describes it as a dull ache.  No swelling or calf pain.  Aleve does help.  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 Physical Examination: Neck - supple, no significant adenopathy Chest - clear to auscultation, no wheezes, rales or rhonchi, symmetric air entry Heart - normal rate, regular rhythm, normal S1, S2, no murmurs, rubs, clicks or gallops Abdomen - soft, nontender, nondistended, no masses or organomegaly Breasts - breasts appear normal, no suspicious masses, no skin or nipple changes or axillary nodes Pelvic - normal external genitalia, vulva, vagina, cervix, uterus and adnexa, atrophic Musculoskeletal - no joint tenderness, deformity or swelling Extremities - peripheral pulses normal, no pedal edema, no clubbing or cyanosis Skin - normal coloration and turgor, no rashes, no suspicious skin lesions noted Normal AEX Pt due for mammogram no colonoscopy due no Pap done no due 2014 RT one year Diet and exercise discussed Pt to DR [facility] for evaluation of leg pain Pt seen by general surgery for lipomas on back and arms.  She desires to monitor them for now Discussed dense breast letter and BSE

## 2011-12-20 NOTE — Patient Instructions (Signed)
Breast Self-Awareness  Practicing breast self-awareness may pick up problems early, prevent significant medical complications, and possibly save your life. By practicing breast self-awareness, you can become familiar with how your breasts look and feel and if your breasts are changing. This allows you to notice changes early. It can also offer you some reassurance that your breast health is good. One way to learn what is normal for your breasts and whether your breasts are changing is to do a breast self-exam.  If you find a lump or something that was not present in the past, it is best to contact your caregiver right away. Other findings that should be evaluated by your caregiver include nipple discharge, especially if it is bloody; skin changes or reddening; areas where the skin seems to be pulled in (retracted); or new lumps and bumps. Breast pain is seldom associated with cancer (malignancy), but should also be evaluated by a caregiver.  BREAST SELF-EXAM  The best time to examine your breasts is 5 7 days after your menstrual period is over. During menstruation, the breasts are lumpier, and it may be more difficult to pick up changes. If you do not menstruate, have reached menopause, or had your uterus removed (hysterectomy), you should examine your breasts at regular intervals, such as monthly. If you are breastfeeding, examine your breasts after a feeding or after using a breast pump. Breast implants do not decrease the risk for lumps or tumors, so continue to perform breast self-exams as recommended. Talk to your caregiver about how to determine the difference between the implant and breast tissue. Also, talk about the amount of pressure you should use during the exam. Over time, you will become more familiar with the variations of your breasts and more comfortable with the exam. A breast self-exam requires you to remove all your clothes above the waist.    Look at your breasts and nipples. Stand in front of  a mirror in a room with good lighting. With your hands on your hips, push your hands firmly downward. Look for a difference in shape, contour, and size from one breast to the other (asymmetry). Asymmetry includes puckers, dips, or bumps. Also, look for skin changes, such as reddened or scaly areas on the breasts. Look for nipple changes, such as discharge, dimpling, repositioning, or redness.   Carefully feel your breasts. This is best done either in the shower or tub while using soapy water or when flat on your back. Place the arm (on the side of the breast you are examining) above your head. Use the pads (not the fingertips) of your three middle fingers on your opposite hand to feel your breasts. Start in the underarm area and use  inch (2 cm) overlapping circles to feel your breast. Use 3 different levels of pressure (light, medium, and firm pressure) at each circle before moving to the next circle. The light pressure is needed to feel the tissue closest to the skin. The medium pressure will help to feel breast tissue a little deeper, while the firm pressure is needed to feel the tissue close to the ribs. Continue the overlapping circles, moving downward over the breast until you feel your ribs below your breast. Then, move one finger-width towards the center of the body. Continue to use the  inch (2 cm) overlapping circles to feel your breast as you move slowly up toward the collar bone (clavicle) near the base of the neck. Continue the up and down exam using all 3 pressures   until you reach the middle of the chest. Do this with each breast, carefully feeling for lumps or changes.   Keep a written record with breast changes or normal findings for each breast. By writing this information down, you do not need to depend only on memory for size, tenderness, or location. Write down where you are in your menstrual cycle, if you are still menstruating.   Breast tissue can have some lumps or thick tissue. However,  see your caregiver if you find anything that concerns you.   SEEK MEDICAL CARE IF:   You see a change in shape, contour, or size of your breasts or nipples.    You see skin changes, such as reddened or scaly areas on the breasts or nipples.    You have an unusual discharge from your nipples.    You feel a new lump or unusually thick areas.   Document Released: 01/11/2005 Document Revised: 07/13/2011 Document Reviewed: 04/28/2011  [employer] Patient Information 2013 [employer].

## 2012-01-28 ENCOUNTER — Emergency Department (HOSPITAL_COMMUNITY): Payer: Managed Care, Other (non HMO)

## 2012-01-28 ENCOUNTER — Emergency Department (HOSPITAL_COMMUNITY)
Admission: EM | Admit: 2012-01-28 | Discharge: 2012-01-28 | Disposition: A | Discharge status: Home / Self Care | Payer: Managed Care, Other (non HMO) | Attending: Emergency Medicine | Admitting: Emergency Medicine

## 2012-01-28 DIAGNOSIS — Z8742 Personal history of other diseases of the female genital tract: Secondary | ICD-10-CM | POA: Insufficient documentation

## 2012-01-28 DIAGNOSIS — R059 Cough, unspecified: Secondary | ICD-10-CM | POA: Insufficient documentation

## 2012-01-28 DIAGNOSIS — N951 Menopausal and female climacteric states: Secondary | ICD-10-CM | POA: Insufficient documentation

## 2012-01-28 DIAGNOSIS — Z79899 Other long term (current) drug therapy: Secondary | ICD-10-CM | POA: Insufficient documentation

## 2012-01-28 DIAGNOSIS — J111 Influenza due to unidentified influenza virus with other respiratory manifestations: Secondary | ICD-10-CM

## 2012-01-28 DIAGNOSIS — R05 Cough: Secondary | ICD-10-CM | POA: Insufficient documentation

## 2012-01-28 DIAGNOSIS — J3489 Other specified disorders of nose and nasal sinuses: Secondary | ICD-10-CM | POA: Insufficient documentation

## 2012-01-28 DIAGNOSIS — J189 Pneumonia, unspecified organism: Secondary | ICD-10-CM | POA: Insufficient documentation

## 2012-01-28 DIAGNOSIS — I10 Essential (primary) hypertension: Secondary | ICD-10-CM | POA: Insufficient documentation

## 2012-01-28 DIAGNOSIS — R52 Pain, unspecified: Secondary | ICD-10-CM | POA: Insufficient documentation

## 2012-01-28 DIAGNOSIS — Z791 Long term (current) use of non-steroidal anti-inflammatories (NSAID): Secondary | ICD-10-CM | POA: Insufficient documentation

## 2012-01-28 LAB — BASIC METABOLIC PANEL
Chloride: 100 mEq/L (ref 96–112)
GFR calc Af Amer: 68 mL/min — ABNORMAL LOW (ref >90)
GFR calc non Af Amer: 58 mL/min — ABNORMAL LOW (ref >90)
Glucose, Bld: 124 mg/dL — ABNORMAL HIGH (ref 70–99)
Potassium: 3.5 mEq/L (ref 3.5–5.1)
Sodium: 139 mEq/L (ref 135–145)

## 2012-01-28 LAB — CBC
Hemoglobin: 13.9 g/dL (ref 12.0–15.0)
MCHC: 33.5 g/dL (ref 30.0–36.0)
WBC: 10.3 10*3/uL (ref 4.0–10.5)

## 2012-01-28 MED ORDER — SODIUM CHLORIDE 0.9 % IV BOLUS (SEPSIS)
1000.0000 mL | Freq: Once | INTRAVENOUS | Status: AC
  Administered 2012-01-28: 1000 mL via INTRAVENOUS

## 2012-01-28 MED ORDER — OSELTAMIVIR PHOSPHATE 75 MG PO CAPS: 75.0000 mg | ORAL_CAPSULE | Freq: Two times a day (BID) | ORAL | Status: DC

## 2012-01-28 MED ORDER — KETOROLAC TROMETHAMINE 30 MG/ML IJ SOLN
30.0000 mg | Freq: Once | INTRAMUSCULAR | Status: AC
  Administered 2012-01-28: 30 mg via INTRAVENOUS
  Filled 2012-01-28: qty 1

## 2012-01-28 MED ORDER — AZITHROMYCIN 250 MG PO TABS: 250.0000 mg | ORAL_TABLET | Freq: Every day | ORAL | Status: DC

## 2012-01-28 MED ORDER — DEXTROSE 5 % IV SOLN
1.0000 g | Freq: Once | INTRAVENOUS | Status: AC
  Administered 2012-01-28: 1 g via INTRAVENOUS
  Filled 2012-01-28: qty 10

## 2012-01-28 MED ORDER — AZITHROMYCIN 250 MG PO TABS
500.0000 mg | ORAL_TABLET | Freq: Once | ORAL | Status: AC
  Administered 2012-01-28: 500 mg via ORAL
  Filled 2012-01-28: qty 2

## 2012-01-28 MED ORDER — ALBUTEROL SULFATE HFA 108 (90 BASE) MCG/ACT IN AERS
2.0000 | INHALATION_SPRAY | RESPIRATORY_TRACT | Status: DC | PRN
  Administered 2012-01-28: 2 via RESPIRATORY_TRACT
  Filled 2012-01-28: qty 6.7

## 2012-01-28 NOTE — ED Notes (Signed)
Per report from [facility] pt has been having runny nose and chills.  She reportedly sought care at the urgent care and was noted to be weak and hypotensive.  Her initial Bp by staff at the urgent care was 70/50 down from (134/56).  The pt on ems arrival was noted to have a systolic BP in the 130's and then had another hypotensive drop into the 90's with no change in HR during the episodes.

## 2012-01-28 NOTE — ED Notes (Signed)
Patient transported to X-ray 

## 2012-01-28 NOTE — ED Provider Notes (Signed)
History     CSN: [id number]  Arrival date & time 01/28/12  1048   First MD Initiated Contact with Patient 01/28/12 1113      Chief Complaint  Patient presents with  . Hypotension    (Consider location/radiation/quality/duration/timing/severity/associated sxs/prior treatment) HPI Pt presents with c/o cough, congestion, body aches and subjective fever for several days.  She was going to [facility] today when she felt very weak and UC checked her blood pressure which was low.  No syncope.  She states she has been drinking less liquids than her usual.  No vomiting or diarrhea.  No chest pain.  Cough is nonproductive.  No leg swelling.  No specific sick contacts.  There are no other associated systemic symptoms, there are no other alleviating or modifying factors.   Past Medical History  Diagnosis Date  . Hypertension   . Wears glasses   . Menopause   . Fibroids   . Vaginal lesion     Past Surgical History  Procedure Date  . 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 Cholecystectomy 2001  . Hysteroscopy     Family History  Problem Relation Age of Onset  . Leukemia Mother   . Aneurysm Father   . Cancer Brother     brain    History  Substance Use Topics  . Smoking status: Never Smoker   . Smokeless tobacco: Never Used  . Alcohol Use: No    OB History    Grav Para Term Preterm Abortions TAB SAB Ect Mult Living   2 2        2       Review of Systems ROS reviewed and all otherwise negative except for mentioned in HPI  Allergies  Review of patient's allergies indicates no known allergies.  Home Medications   Current Outpatient Rx  Name  Route  Sig  Dispense  Refill  . CALCIUM CITRATE-VITAMIN D 315-200 MG-UNIT PO TABS   Oral   Take 1 tablet by mouth daily.           Marland Kitchen LOSARTAN POTASSIUM-HCTZ 50-12.5 MG PO TABS   Oral   Take 1 tablet by mouth daily.           . MELOXICAM 15 MG PO TABS   Oral   Take 15 mg by mouth daily.         Marland Kitchen  ONE-DAILY MULTI VITAMINS PO TABS   Oral   Take 1 tablet by mouth daily.           . AZITHROMYCIN 250 MG PO TABS   Oral   Take 1 tablet (250 mg total) by mouth daily.   4 tablet   0   . OSELTAMIVIR PHOSPHATE 75 MG PO CAPS   Oral   Take 1 capsule (75 mg total) by mouth every 12 (twelve) hours.   10 capsule   0     BP 125/77  Pulse 87  Temp 100 F (37.8 C) (Oral)  Resp 14  Ht 5\' 2"  (1.575 m)  Wt 200 lb (90.719 kg)  BMI 36.58 kg/m2  SpO2 98% Vitals reviewed Physical Exam Physical Examination: General appearance - alert, well appearing, and in no distress Mental status - alert, oriented to person, place, and time Eyes - no scleral icterus, no conjunctival injection Mouth - mucous membranes moist, pharynx normal without lesions Chest - clear to auscultation, no wheezes, rales or rhonchi, symmetric air entry Heart - normal rate, regular rhythm,  normal S1, S2, no murmurs, rubs, clicks or gallops Abdomen - soft, nontender, nondistended, no masses or organomegaly Extremities - peripheral pulses normal, no pedal edema, no clubbing or cyanosis Skin - normal coloration and turgor, no rashes, brisk cap refill  ED Course  Procedures (including critical care time)   Date: 01/28/2012  Rate: 101  Rhythm: sinus tachycardia  QRS Axis: normal  Intervals: normal  ST/T Wave abnormalities: nonspecific T wave changes  Conduction Disutrbances:none  Narrative Interpretation:   Old EKG Reviewed: no significant changes- compared to prior ekg of 01/28/12   Labs Reviewed  BASIC METABOLIC PANEL - Abnormal; Notable for the following:    Glucose, Bld 124 (*)     GFR calc non Af Amer 58 (*)     GFR calc Af Amer 68 (*)     All other components within normal limits  CBC  TROPONIN I   Dg Chest 2 View  01/28/2012  *RADIOLOGY REPORT*  Clinical Data: Fever.  Cough.  Chills.  Body aches.  CHEST - 2 VIEW  Comparison: None.  Findings: Suboptimal inspiration due to body habitus which accounts for  crowded bronchovascular markings at the bases and accentuates the cardiac silhouette.  Streaky opacities in the lower lobes bilaterally and in the lingula.  Lungs otherwise clear. Cardiomediastinal silhouette unremarkable.  Minimal degenerative changes involving the mid thoracic spine.  IMPRESSION: Streaky atelectasis and/or bronchopneumonia in the lower lobes and lingula.   Original Report Authenticated By: Hulan Saas, M.D.      1. Community acquired pneumonia   2. Influenza-like illness       MDM  Pt presenting with c/o cough, congestion, myalgias, subjective fever with transiently low BP at urgent care- here in the ED her blood pressure has been normal.  She has streaky areas of atelectasis on her CXR c/w pneumonia- she has been started on rocephin and zithromax.  Also given IV hydration.  Will give rx for tamiflu as well.  Discharged with strict return precautions.  Pt agreeable with plan.        Ethelda Chick, MD 01/28/12 [phone]

## 2012-01-28 NOTE — ED Notes (Signed)
MD at bedside. 

## 2012-03-11 ENCOUNTER — Other Ambulatory Visit: Payer: Self-pay

## 2012-09-13 ENCOUNTER — Other Ambulatory Visit (HOSPITAL_COMMUNITY): Payer: Self-pay | Admitting: Family Medicine

## 2012-09-13 DIAGNOSIS — Z1231 Encounter for screening mammogram for malignant neoplasm of breast: Secondary | ICD-10-CM

## 2012-09-28 ENCOUNTER — Ambulatory Visit (HOSPITAL_COMMUNITY)
Admission: RE | Admit: 2012-09-28 | Discharge: 2012-09-28 | Disposition: A | Discharge status: Home / Self Care | Payer: 59 | Source: Ambulatory Visit | Attending: Family Medicine | Admitting: Family Medicine

## 2012-09-28 ENCOUNTER — Other Ambulatory Visit (HOSPITAL_COMMUNITY): Payer: Self-pay | Admitting: Family Medicine

## 2012-09-28 DIAGNOSIS — Z1231 Encounter for screening mammogram for malignant neoplasm of breast: Secondary | ICD-10-CM

## 2012-11-03 ENCOUNTER — Other Ambulatory Visit: Payer: Self-pay | Admitting: Family Medicine

## 2012-11-03 ENCOUNTER — Other Ambulatory Visit (HOSPITAL_COMMUNITY)
Admission: RE | Admit: 2012-11-03 | Discharge: 2012-11-03 | Disposition: A | Discharge status: Home / Self Care | Payer: 59 | Source: Ambulatory Visit | Attending: Family Medicine | Admitting: Family Medicine

## 2012-11-03 DIAGNOSIS — Z1151 Encounter for screening for human papillomavirus (HPV): Secondary | ICD-10-CM | POA: Insufficient documentation

## 2012-11-03 DIAGNOSIS — Z124 Encounter for screening for malignant neoplasm of cervix: Secondary | ICD-10-CM | POA: Insufficient documentation

## 2012-11-17 ENCOUNTER — Encounter: Payer: Self-pay | Admitting: Podiatrist

## 2012-11-17 ENCOUNTER — Ambulatory Visit (INDEPENDENT_AMBULATORY_CARE_PROVIDER_SITE_OTHER): Payer: 59 | Admitting: Podiatrist

## 2012-11-17 VITALS — BP 126/89 | HR 102 | Resp 14 | Ht 61.0 in | Wt 203.0 lb

## 2012-11-17 DIAGNOSIS — L600 Ingrowing nail: Secondary | ICD-10-CM

## 2012-11-17 DIAGNOSIS — B351 Tinea unguium: Secondary | ICD-10-CM

## 2012-11-17 NOTE — Patient Instructions (Signed)
We will call you with the results of the culture of your toenail sample-  See below for treatment options-- I would recommend Lamisil for your particular concern.  ANTIBACTERIAL SOAP INSTRUCTIONS  THE DAY AFTER PROCEDURE     Shower as usual. Before getting out, place a drop of antibacterial liquid soap (Dial) on a wet, clean washcloth.  Gently wipe washcloth over affected area.  Afterward, rinse the area with warm water.  Blot the area dry with a soft cloth and cover with antibiotic ointment (neosporin, polysporin, bacitracin) and band aid or gauze and tape  OR    Place 3-4 drops of antibacterial liquid soap in a quart of warm tap water.  Submerge foot into water for 20 minutes.  If bandage was applied after your procedure, leave on to allow for easy lift off, then remove and continue with soak for the remaining time.  Next, blot area dry with a soft cloth and cover with a bandage.  Apply other medications as directed by your doctor, such as cortisporin otic solution (eardrops) or neosporin antibiotic ointment    Onychomycosis/Fungal Toenails  WHAT IS IT? An infection that lies within the keratin of your nail plate that is caused by a fungus.  WHY ME? Fungal infections affect all ages, sexes, races, and creeds.  There may be many factors that predispose you to a fungal infection such as age, coexisting medical conditions such as diabetes, or an autoimmune disease; stress, medications, fatigue, genetics, etc.  Bottom line: fungus thrives in a warm, moist environment and your shoes offer such a location.  IS IT CONTAGIOUS? Theoretically, yes.  You do not want to share shoes, nail clippers or files with someone who has fungal toenails.  Walking around barefoot in the same room or sleeping in the same bed is unlikely to transfer the organism.  It is important to realize, however, that fungus can spread easily from one nail to the next on the same foot.  HOW DO WE TREAT THIS?  There are several  ways to treat this condition.  Treatment may depend on many factors such as age, medications, pregnancy, liver and kidney conditions, etc.  It is best to ask your doctor which options are available to you.  1. No treatment.   Unlike many other medical concerns, you can live with this condition.  However for many people this can be a painful condition and may lead to ingrown toenails or a bacterial infection.  It is recommended that you keep the nails cut short to help reduce the amount of fungal nail. 2. Topical treatment.  These range from herbal remedies to prescription strength nail lacquers.  About 10-20% effective, topicals require twice daily application for approximately 9 to 12 months or until an entirely new nail has grown out.  The most effective topicals are medical grade medications available through physicians offices. 3. Oral antifungal medications.  With an 80-90% cure rate, the most common oral medication requires 3 to 4 months of therapy and stays in your system for a year as the new nail grows out.  Oral antifungal medications do require blood work to make sure it is a safe drug for you.  A liver function panel will be performed prior to starting the medication and after the first month of treatment.  It is important to have the blood work performed to avoid any harmful side effects.  In general, this medication safe but blood work is required. 4. Laser Therapy.  This treatment is  performed by applying a specialized laser to the affected nail plate.  This therapy is noninvasive, fast, and non-painful.  It is not covered by insurance and is therefore, out of pocket. 5. Permanent Nail Avulsion.  Removing the entire nail so that a new nail will not grow back.

## 2012-11-17 NOTE — Progress Notes (Signed)
Subjective: Patient presents today for sore great toenails bilateral. Patient states they've been hurting for for 6 months or more. In the past patient had a toenail procedure performed by Dr.Petrinitz and she was happy with the result. Patient relates that now the nails are thick and uncomfortable especially with pressure and in shoes.  GENERAL APPEARANCE: Alert, conversant. Appropriately groomed. No acute distress.  VASCULAR: Pedal pulses palpable and strong bilateral.  Capillary refill time is immediate to all digits,  Proximal to distal cooling it warm to warm.  Digital hair growth is present bilateral  NEUROLOGIC: sensation is intact epicritically and protectively to 5.07 monofilament at 5/5 sites bilateral.  Light touch is intact bilateral, vibratory sensation intact bilateral, achilles tendon reflex is intact bilateral.  MUSCULOSKELETAL: acceptable muscle strength, tone and stability bilateral.  Intrinsic muscluature intact bilateral.  Rectus appearance of foot and digits noted bilateral.   DERMATOLOGIC: skin color, texture, and turger are within normal limits.  No preulcerative lesions are seen, no interdigital maceration noted.  No open lesions present.  Digital nails are symptomatic bilateral first. Lateral borders are painful and uncomfortable with pressure. Probable fungal infection is also present consistent with yellow brown discoloration and mycotic appearance.  Assessment: Ingrown toenails lateral nail borders bilateral first.  Mycotic toenails  Plan:Treatment options and alternatives discussed.  Recommended permanent phenol matrixectomy and patient agreed.  Bilateral hallux lateral border- was prepped with alcohol and a 1 to 1 mix of 0.5% marcaine plain and 2% lidocaine plain was administered in a digital block fashion.  The toe was then prepped with betadine solution and exsanguinated.  The offending nail border was then excised and matrix tissue exposed.  Phenol was then applied to the  matrix tissue followed by an alcohol wash.  Antibiotic ointment and a dry sterile dressing was applied.  The patient was dispensed instructions for aftercare.   And a portion of the nail for evaluation at [facility] pathology labs. We'll notify the patient the results of the culture

## 2012-11-30 ENCOUNTER — Other Ambulatory Visit: Payer: Self-pay

## 2012-12-25 ENCOUNTER — Telehealth: Payer: Self-pay | Admitting: *Deleted

## 2012-12-25 NOTE — Telephone Encounter (Addendum)
Left message to call for result of testing.  I will inform of positive fungal results and offer Lamisil therapy and instruct pt on bloodwork, when she calls. 12/26/2012 - pt returned my call.  I informed pt of DR Egerton's orders and explained the treatment and lab.  Pt states will research Lamisil and call again.

## 2013-02-07 ENCOUNTER — Ambulatory Visit (INDEPENDENT_AMBULATORY_CARE_PROVIDER_SITE_OTHER): Payer: Managed Care, Other (non HMO) | Admitting: Surgery

## 2013-02-28 ENCOUNTER — Ambulatory Visit (INDEPENDENT_AMBULATORY_CARE_PROVIDER_SITE_OTHER): Payer: 59 | Admitting: Surgery

## 2013-02-28 ENCOUNTER — Encounter (INDEPENDENT_AMBULATORY_CARE_PROVIDER_SITE_OTHER): Payer: Self-pay | Admitting: Surgery

## 2013-02-28 DIAGNOSIS — I10 Essential (primary) hypertension: Secondary | ICD-10-CM

## 2013-02-28 DIAGNOSIS — Z6838 Body mass index (BMI) 38.0-38.9, adult: Secondary | ICD-10-CM

## 2013-02-28 NOTE — Progress Notes (Signed)
Patient ID: Chelsea Landry, female   DOB: 06/11/1955, 58 y.o.   MRN: [id number]  No chief complaint on file.   HPI Chelsea Landry is a 58 y.o. female.  This patient presents for an initial weight loss surgery evaluation. She has a  BMI of 36 with hypertension and prediabetes. She states that she has struggled with her weight since the birth of her child and she states that she never lost her baby weight. She has tried several diets and was enrolled in a weight loss program at [facility] program but she states that this "didn't feel right" she is not really exercising. She denies any reflux. HPI  Past Medical History  Diagnosis Date  . Hypertension   . Wears glasses   . Menopause   . Fibroids   . Vaginal lesion     Past Surgical History  Procedure Laterality Date  . 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 Cholecystectomy  2001  . Hysteroscopy      Family History  Problem Relation Age of Onset  . Leukemia Mother   . Aneurysm Father   . Cancer Brother     brain    Social History History  Substance Use Topics  . Smoking status: Never Smoker   . Smokeless tobacco: Never Used  . Alcohol Use: No    No Known Allergies  Current Outpatient Prescriptions  Medication Sig Dispense Refill  . calcium citrate-vitamin D (CITRACAL+D) 315-200 MG-UNIT per tablet Take 1 tablet by mouth daily.        Marland Kitchen losartan-hydrochlorothiazide (HYZAAR) 50-12.5 MG per tablet Take 1 tablet by mouth daily.        . Multiple Vitamin (MULTIVITAMIN) tablet Take 1 tablet by mouth daily.         No current facility-administered medications for this visit.    Review of Systems Review of Systems All other review of systems negative or noncontributory except as stated in the HPI  Blood pressure 124/78, pulse 88, resp. rate 14, height 5\' 1"  (1.549 m), weight 204 lb 6.4 oz (92.715 kg).  Physical Exam Physical Exam Physical Exam  Nursing note and vitals reviewed. Constitutional: She is oriented to  person, place, and time. She appears well-developed and well-nourished. No distress.  HENT:  Head: Normocephalic and atraumatic.  Mouth/Throat: No oropharyngeal exudate.  Eyes: Conjunctivae and EOM are normal. Pupils are equal, round, and reactive to light. Right eye exhibits no discharge. Left eye exhibits no discharge. No scleral icterus.  Neck: Normal range of motion. Neck supple. No tracheal deviation present.  Cardiovascular: Normal rate, regular rhythm, normal heart sounds and intact distal pulses.   Pulmonary/Chest: Effort normal and breath sounds normal. No stridor. No respiratory distress. She has no wheezes.  Abdominal: Soft. Bowel sounds are normal. She exhibits no distension and no mass. There is no tenderness. There is no rebound and no guarding.  Musculoskeletal: Normal range of motion. She exhibits no edema and no tenderness.  Neurological: She is alert and oriented to person, place, and time.  Skin: Skin is warm and dry. No rash noted. She is not diaphoretic. No erythema. No pallor.  Psychiatric: She has a normal mood and affect. Her behavior is normal. Judgment and thought content normal.    Data Reviewed   Assessment    Morbid obesity with a BMI of 36 and hypertension and prediabetes We had a long discussion regarding all of the weight loss surgery option  including continued medical weight  loss and surgical options of a lap band, the sleeve gastrectomy, and Roux-en-Y gastric bypass. We discussed the pros and cons of each procedure as well as there perioperative management. We discussed the risks. She is unsure of which procedures that she is interested in having and I recommended that she go home and do some additional research on each of the procedures and she can only back when she decides what she would like to proceed with. I think that she be a fine candidate for any of the procedures.  At least 45 minutes was spent counseling this patient.    Plan    She was  approved for this back in 2013 and then changed insurance. Her workup showed a tiny sliding hiatal hernia. She's previously had her gallbladder removed. She snores but does not have obstructive sleep apnea. Today her BMI is 38.6. She weighed is 204. We discussed the sleeve gastrectomy which she is interested in;  I will turn this scheduling over to [employer].         Chelsea Landry 02/28/2013, 4:19 PM   She called back and is now interested in the sleeve gastrectomy.  Will proceed with this workup.

## 2013-02-28 NOTE — Patient Instructions (Signed)
Sleeve Gastrectomy A sleeve gastrectomy is a surgery in which a large portion of the stomach is removed. After the surgery, the stomach will be a narrow tube about the size of a banana. This surgery is performed to help a person lose weight. The person loses weight because the reduced size of the stomach restricts the amount of food that the person can eat. The stomach will hold much less food than before the surgery. Also, the part of the stomach that is removed produces a hormone that causes hunger.  This surgery is done for people who have morbid obesity, defined as a body mass index (BMI) greater than 40. BMI is an estimate of body fat and is calculated from the height and weight of a person. This surgery may also be done for people with a BMI between 35 and 40 if they have other diseases, such as type 2 diabetes mellitus, obstructive sleep apnea, or heart and lung disorders (cardiopulmonary diseases).  LET YOUR HEALTH CARE PROVIDER KNOW ABOUT:  Any allergies you have.   All medicines you are taking, including vitamins, herbs, eyedrops, creams, and over-the-counter medicines.   Use of steroids (by mouth or creams).   Previous problems you or members of your family have had with the use of anesthetics.   Any blood disorders you have.   Previous surgeries you have had.   Possibility of pregnancy, if this applies.   Other health problems you have. RISKS AND COMPLICATIONS Generally, sleeve gastrectomy is a safe procedure. However, as with any procedure, complications can occur. Possible complications include:  Infection.  Bleeding.  Blood clots.  Damage to other organs or tissue.  Leakage of fluid from the stomach into the abdominal cavity (rare). BEFORE THE PROCEDURE  You may need to have blood tests and imaging tests (such as X-rays or ultrasonography) done before the day of surgery. A test to evaluate your esophagus and how it moves (esophageal manometry) may also be  done.  You may be placed on a liquid diet 2 3 weeks before the surgery.  Ask your health care provider about changing or stopping your regular medicines.  Do not eat or drink anything for at least 8 hours before the procedure.   Make plans to have someone drive you home after your hospital stay. Also arrange for someone to help you with activities during recovery. PROCEDURE  A laparoscopic technique is usually used for this surgery:  You will be given medicine to make you sleep through the procedure (general anesthetic). This medicine will be given through an intravenous (IV) access tube that is put into one of your veins.  Once you are asleep, your abdomen will be cleaned and sterilized.  Several small incisions will be made in your abdomen.  Your abdomen will be filled with air so that it expands. This gives the surgeon more room to operate and makes your organs easier to see.  A thin, lighted tube with a tiny camera on the end (laparoscope) is put through a small incision in your abdomen. The camera on the laparoscope sends a picture to a TV screen in the operating room. This gives the surgeon a good view inside the abdomen.  Hollow tubes are put through the other small incisions in your abdomen. The tools needed for the procedure are put through these tubes.  The surgeon uses staples to divide part of the stomach and then removes it through one of the incisions.  The remaining stomach may be reinforced using   stitches or surgical glue or both to prevent leakage of the stomach contents. A small tube (drain) may be placed through one of the incisions to allow extra fluid to flow from the area.  The incisions are closed with stitches, staples, or glue. AFTER THE PROCEDURE  You will be monitored closely in a recovery area. Once the anesthetic has worn off, you will likely be moved to a regular hospital room.  You will be given medicine for pain and nausea.   You may have a drain  from one of the incisions in your abdomen. If a drain is used, it may stay in place after you go home from the hospital and be removed at a follow-up appointment.   You will be encouraged to walk around several times a day. This helps prevent blood clots.  You will be started on a liquid diet the first day after your surgery. Sometimes a test is done to check for leaking before you can eat.  You will be urged to cough and do deep breathing exercises. This helps prevent a lung infection after a surgery.  You will likely need to stay in the hospital for a few days.  Document Released: 11/08/2008 Document Revised: 09/13/2012 Document Reviewed: 05/26/2012 [employer] Patient Information 2014 [employer].  

## 2013-03-26 ENCOUNTER — Other Ambulatory Visit (INDEPENDENT_AMBULATORY_CARE_PROVIDER_SITE_OTHER): Payer: Self-pay

## 2013-03-26 DIAGNOSIS — R7303 Prediabetes: Secondary | ICD-10-CM

## 2013-03-26 DIAGNOSIS — I10 Essential (primary) hypertension: Secondary | ICD-10-CM

## 2013-04-06 ENCOUNTER — Ambulatory Visit (HOSPITAL_COMMUNITY)
Admission: RE | Admit: 2013-04-06 | Discharge: 2013-04-06 | Disposition: A | Discharge status: Home / Self Care | Payer: 59 | Source: Ambulatory Visit | Attending: Surgery | Admitting: Surgery

## 2013-04-06 ENCOUNTER — Other Ambulatory Visit: Payer: Self-pay

## 2013-04-06 DIAGNOSIS — Z6838 Body mass index (BMI) 38.0-38.9, adult: Secondary | ICD-10-CM | POA: Insufficient documentation

## 2013-04-06 DIAGNOSIS — R7303 Prediabetes: Secondary | ICD-10-CM

## 2013-04-06 DIAGNOSIS — I10 Essential (primary) hypertension: Secondary | ICD-10-CM

## 2013-04-06 DIAGNOSIS — K449 Diaphragmatic hernia without obstruction or gangrene: Secondary | ICD-10-CM | POA: Insufficient documentation

## 2013-04-06 DIAGNOSIS — N289 Disorder of kidney and ureter, unspecified: Secondary | ICD-10-CM | POA: Insufficient documentation

## 2013-04-06 DIAGNOSIS — Z01811 Encounter for preprocedural respiratory examination: Secondary | ICD-10-CM | POA: Insufficient documentation

## 2013-04-06 DIAGNOSIS — R7309 Other abnormal glucose: Secondary | ICD-10-CM | POA: Insufficient documentation

## 2013-04-06 DIAGNOSIS — K3189 Other diseases of stomach and duodenum: Secondary | ICD-10-CM | POA: Insufficient documentation

## 2013-04-06 DIAGNOSIS — R1013 Epigastric pain: Secondary | ICD-10-CM

## 2013-04-06 LAB — CBC WITH DIFFERENTIAL/PLATELET
BASOS ABS: 0 10*3/uL (ref 0.0–0.1)
BASOS PCT: 0 % (ref 0–1)
EOS ABS: 0.2 10*3/uL (ref 0.0–0.7)
EOS PCT: 3 % (ref 0–5)
HEMATOCRIT: 41.7 % (ref 36.0–46.0)
Hemoglobin: 14.1 g/dL (ref 12.0–15.0)
Lymphocytes Relative: 28 % (ref 12–46)
Lymphs Abs: 1.8 10*3/uL (ref 0.7–4.0)
MCH: 29 pg (ref 26.0–34.0)
MCHC: 33.8 g/dL (ref 30.0–36.0)
MCV: 85.6 fL (ref 78.0–100.0)
MONO ABS: 0.3 10*3/uL (ref 0.1–1.0)
MONOS PCT: 5 % (ref 3–12)
NEUTROS ABS: 4.2 10*3/uL (ref 1.7–7.7)
Neutrophils Relative %: 64 % (ref 43–77)
Platelets: 280 10*3/uL (ref 150–400)
RBC: 4.87 MIL/uL (ref 3.87–5.11)
RDW: 14.4 % (ref 11.5–15.5)
WBC: 6.6 10*3/uL (ref 4.0–10.5)

## 2013-04-07 LAB — COMPREHENSIVE METABOLIC PANEL
ALK PHOS: 57 U/L (ref 39–117)
ALT: 32 U/L (ref 0–35)
AST: 27 U/L (ref 0–37)
Albumin: 4 g/dL (ref 3.5–5.2)
BILIRUBIN TOTAL: 0.5 mg/dL (ref 0.2–1.2)
BUN: 14 mg/dL (ref 6–23)
CO2: 29 mEq/L (ref 19–32)
CREATININE: 0.84 mg/dL (ref 0.50–1.10)
Calcium: 9.2 mg/dL (ref 8.4–10.5)
Chloride: 101 mEq/L (ref 96–112)
GLUCOSE: 153 mg/dL — AB (ref 70–99)
POTASSIUM: 3.8 meq/L (ref 3.5–5.3)
Sodium: 138 mEq/L (ref 135–145)
Total Protein: 6.5 g/dL (ref 6.0–8.3)

## 2013-04-07 LAB — T4: T4 TOTAL: 5.7 ug/dL (ref 5.0–12.5)

## 2013-04-07 LAB — TSH: TSH: 2.585 u[IU]/mL (ref 0.350–4.500)

## 2013-04-09 ENCOUNTER — Telehealth (INDEPENDENT_AMBULATORY_CARE_PROVIDER_SITE_OTHER): Payer: Self-pay

## 2013-04-09 NOTE — Telephone Encounter (Signed)
Called and left message for patient to call our office.  Need to speak with patient regarding recent US results.

## 2013-04-11 ENCOUNTER — Other Ambulatory Visit (INDEPENDENT_AMBULATORY_CARE_PROVIDER_SITE_OTHER): Payer: Self-pay

## 2013-04-11 DIAGNOSIS — R399 Unspecified symptoms and signs involving the genitourinary system: Secondary | ICD-10-CM

## 2013-04-12 ENCOUNTER — Telehealth (INDEPENDENT_AMBULATORY_CARE_PROVIDER_SITE_OTHER): Payer: Self-pay | Admitting: *Deleted

## 2013-04-12 NOTE — Telephone Encounter (Signed)
LMOM for pt to return my call.  I was calling to notify her of the appt for her MRI at [facility] on 04/18/13 with an arrival time of 2:15pm.  She is to be NPO 4 hours prior to scan.

## 2013-04-13 ENCOUNTER — Telehealth (INDEPENDENT_AMBULATORY_CARE_PROVIDER_SITE_OTHER): Payer: Self-pay | Admitting: *Deleted

## 2013-04-13 NOTE — Telephone Encounter (Signed)
error 

## 2013-04-13 NOTE — Telephone Encounter (Signed)
I left a detailed message pertaining to the appt below:

## 2013-04-17 ENCOUNTER — Encounter (HOSPITAL_COMMUNITY)
Admission: RE | Disposition: A | Discharge status: Home / Self Care | Payer: Self-pay | Source: Ambulatory Visit | Attending: Surgery

## 2013-04-17 ENCOUNTER — Ambulatory Visit (HOSPITAL_COMMUNITY)
Admission: RE | Admit: 2013-04-17 | Discharge: 2013-04-17 | Disposition: A | Discharge status: Home / Self Care | Payer: 59 | Source: Ambulatory Visit | Attending: Surgery | Admitting: Surgery

## 2013-04-17 DIAGNOSIS — Z01818 Encounter for other preprocedural examination: Secondary | ICD-10-CM | POA: Insufficient documentation

## 2013-04-17 HISTORY — PX: BREATH TEK H PYLORI: SHX5422

## 2013-04-17 SURGERY — BREATH TEST, FOR HELICOBACTER PYLORI

## 2013-04-18 ENCOUNTER — Encounter (HOSPITAL_COMMUNITY): Payer: Self-pay | Admitting: Surgery

## 2013-04-18 ENCOUNTER — Ambulatory Visit
Admission: RE | Admit: 2013-04-18 | Discharge: 2013-04-18 | Disposition: A | Discharge status: Home / Self Care | Payer: 59 | Source: Ambulatory Visit | Attending: Surgery | Admitting: Surgery

## 2013-04-18 DIAGNOSIS — R399 Unspecified symptoms and signs involving the genitourinary system: Secondary | ICD-10-CM

## 2013-04-18 MED ORDER — GADOBENATE DIMEGLUMINE 529 MG/ML IV SOLN
19.0000 mL | Freq: Once | INTRAVENOUS | Status: AC | PRN
  Administered 2013-04-18: 19 mL via INTRAVENOUS

## 2013-04-23 ENCOUNTER — Telehealth (INDEPENDENT_AMBULATORY_CARE_PROVIDER_SITE_OTHER): Payer: Self-pay

## 2013-04-23 NOTE — Telephone Encounter (Signed)
Called and spoke to patient to give MRI results.  Patient made aware of the Radiologist notes and 6 month recommendations, patient has many questions.  Patient would like to know if this is something that can be surgically removed?  Patient concerned about possibility of malignancy.  Patient advised that I will send the information to Dr. Hassell Done and I will call patient back after Dr. Hassell Done has reviewed the MRI results and recommendations.  Patient verbalized understanding and agrees with plan as above.

## 2013-04-23 NOTE — Telephone Encounter (Signed)
Pt calling in b/c she would like her MRI results. Please call pt at [phone] till 1pm then the pt will be at work.

## 2013-04-30 ENCOUNTER — Telehealth (INDEPENDENT_AMBULATORY_CARE_PROVIDER_SITE_OTHER): Payer: Self-pay

## 2013-04-30 NOTE — Telephone Encounter (Signed)
Called and spoke to patient to make her aware that Dr. Hassell Done did review her MRI, patient may continue with weight loss surgery as planned and we will follow up MRI in 6 months per Radiologist recommendations.

## 2013-05-04 ENCOUNTER — Encounter: Payer: 59 | Attending: Surgery | Admitting: Dietician

## 2013-05-04 ENCOUNTER — Encounter: Payer: Self-pay | Admitting: Dietician

## 2013-05-04 DIAGNOSIS — Z01818 Encounter for other preprocedural examination: Secondary | ICD-10-CM | POA: Insufficient documentation

## 2013-05-04 DIAGNOSIS — Z6837 Body mass index (BMI) 37.0-37.9, adult: Secondary | ICD-10-CM | POA: Insufficient documentation

## 2013-05-04 DIAGNOSIS — Z713 Dietary counseling and surveillance: Secondary | ICD-10-CM | POA: Insufficient documentation

## 2013-05-04 NOTE — Progress Notes (Signed)
  Pre-Op Assessment Visit:  Pre-Operative Gastric Sleeve Surgery  Medical Nutrition Therapy:  Appt start time: 8177   End time:  1000.  Patient was seen on 05/04/2013 for Pre-Operative Gastric sleeve Nutrition Assessment. Assessment and letter of approval faxed to [facility] Bariatric Surgery Program coordinator on 05/04/2013.   Preferred Learning Style:   No preference indicated   Learning Readiness:  Ready   Handouts given during visit include:  Pre-Op Goals Bariatric Surgery Protein Shakes  Teaching Method Utilized:  Visual Auditory Hands on  Barriers to learning/adherence to lifestyle change: none  Demonstrated degree of understanding via:  Teach Back   [facility] to [facility] to enroll in Pre-Op and Post-Op Nutrition Education when surgery date is scheduled.

## 2013-05-04 NOTE — Patient Instructions (Signed)
Patient to call the [facility] to enroll in Pre-Op and Post-Op Nutrition Education when surgery date is scheduled. 

## 2013-05-31 ENCOUNTER — Encounter: Payer: 59 | Attending: Surgery | Admitting: Dietician

## 2013-05-31 DIAGNOSIS — Z01818 Encounter for other preprocedural examination: Secondary | ICD-10-CM | POA: Insufficient documentation

## 2013-05-31 DIAGNOSIS — Z713 Dietary counseling and surveillance: Secondary | ICD-10-CM | POA: Insufficient documentation

## 2013-05-31 DIAGNOSIS — Z6837 Body mass index (BMI) 37.0-37.9, adult: Secondary | ICD-10-CM | POA: Insufficient documentation

## 2013-05-31 NOTE — Patient Instructions (Addendum)
Try protein shakes. Work on not drinking while eating (waiting 30 minutes to drink). Chew each bite of foods 30 times. Drink mostly water or calorie free beverages.  Call [facility] when surgery is scheduled to enroll in Pre-Op Class.

## 2013-05-31 NOTE — Progress Notes (Signed)
  5 Months Supervised Weight Loss Visit:   Pre-Operative Sleeve Surgery  Medical Nutrition Therapy:  Appt start time: 0845 end time:  0900.  Primary concerns today: Supervised Weight Loss Visit. Chelsea Landry is here today for her last supervised weight loss appointment. Has been seeing her primary care doctor for supervised weight and weight has been fairly stable for the past 6 months. Has been working on [employer] such as chewing food not drinking while eating. Encouraged her to try protein shakes and eat 3 meals per day.   Wt Readings from Last 3 Encounters:  05/31/13 206 lb 1.6 oz (93.486 kg)  05/04/13 205 lb 12.8 oz (93.35 kg)  02/28/13 204 lb 6.4 oz (92.715 kg)   Ht Readings from Last 3 Encounters:  05/31/13 5\' 2"  (1.575 m)  05/04/13 5\' 2"  (1.575 m)  02/28/13 5\' 1"  (1.549 m)   Body mass index is 37.69 kg/(m^2). @BMIFA @ Normalized weight-for-age data available only for age 50 to 37 years. Normalized stature-for-age data available only for age 50 to 66 years.   Progress Towards Goal(s):  In progress.  Handouts given during visit include:  Protein shakes   Nutritional Diagnosis:  [place]-3.3 Overweight/obesity related to past poor dietary habits and physical inactivity as evidenced by patient with patient preparing for sleeve gastrectomy surgery following dietary guidelines for continued weight loss.    Intervention:  Nutrition education. Plan: Try protein shakes. Work on not drinking while eating (waiting 30 minutes to drink). Chew each bite of foods 30 times. Drink mostly water or calorie free beverages.  Call [facility] when surgery is scheduled to enroll in Pre-Op Class.   Monitoring/Evaluation:  Dietary intake, exercise, and body weight. Follow up for Pre-Op Class

## 2013-06-27 ENCOUNTER — Encounter: Payer: 59 | Attending: Surgery | Admitting: Dietician

## 2013-06-27 DIAGNOSIS — Z6837 Body mass index (BMI) 37.0-37.9, adult: Secondary | ICD-10-CM | POA: Insufficient documentation

## 2013-06-27 DIAGNOSIS — Z01818 Encounter for other preprocedural examination: Secondary | ICD-10-CM | POA: Insufficient documentation

## 2013-06-27 DIAGNOSIS — Z713 Dietary counseling and surveillance: Secondary | ICD-10-CM | POA: Insufficient documentation

## 2013-06-27 NOTE — Patient Instructions (Addendum)
Work on not drinking while eating at home (waiting 30 minutes to drink). Chew each bite of foods 30 times. Try [employer] worker).  Pair carbohydrates with protein for snacks.  Try portioning out snacks and putting the container/box away.   Call [facility] when surgery is scheduled to enroll in Pre-Op Class.

## 2013-06-27 NOTE — Progress Notes (Signed)
  5 Months Supervised Weight Loss Visit:   Pre-Operative Sleeve Surgery  Medical Nutrition Therapy:  Appt start time: 1000 end time:  2094.  Primary concerns today: Supervised Weight Loss Visit. Chelsea Landry is here today for her last supervised weight loss appointment. Has tried [employer] and having about 1 per day. Having trouble chewing 20-30 times per bit. Trying to eating smaller meals but feeling hungry. Eating more frequently. Having Crystal Light in water and not having sugar sweetened beverages.   Not doing physical activity. Works on a [employer] and unable to drink while working.    Wt Readings from Last 3 Encounters:  06/27/13 207 lb (93.895 kg)  05/31/13 206 lb 1.6 oz (93.486 kg)  05/04/13 205 lb 12.8 oz (93.35 kg)   Ht Readings from Last 3 Encounters:  06/27/13 5\' 2"  (1.575 m)  05/31/13 5\' 2"  (1.575 m)  05/04/13 5\' 2"  (1.575 m)   Body mass index is 37.85 kg/(m^2). @BMIFA @ Normalized weight-for-age data available only for age 29 to 87 years. Normalized stature-for-age data available only for age 29 to 21 years.   Progress Towards Goal(s):  In progress.  Handouts given during visit include:  15 g CHO Snacks   Nutritional Diagnosis:  [place]-3.3 Overweight/obesity related to past poor dietary habits and physical inactivity as evidenced by patient with patient preparing for sleeve gastrectomy surgery following dietary guidelines for continued weight loss.    Intervention:  Nutrition education. Plan: Work on not drinking while eating at home (waiting 30 minutes to drink). Chew each bite of foods 30 times. Try [employer] Social worker).  Pair carbohydrates with protein for snacks.  Try portioning out snacks and putting the container/box away.   Call [facility] when surgery is scheduled to enroll in Pre-Op Class.   Monitoring/Evaluation:  Dietary intake, exercise, and body weight. Follow up for Pre-Op Class

## 2013-07-25 ENCOUNTER — Encounter: Payer: 59 | Attending: Surgery | Admitting: Dietician

## 2013-07-25 DIAGNOSIS — Z01818 Encounter for other preprocedural examination: Secondary | ICD-10-CM | POA: Insufficient documentation

## 2013-07-25 DIAGNOSIS — Z6837 Body mass index (BMI) 37.0-37.9, adult: Secondary | ICD-10-CM | POA: Insufficient documentation

## 2013-07-25 DIAGNOSIS — Z713 Dietary counseling and surveillance: Secondary | ICD-10-CM | POA: Insufficient documentation

## 2013-07-25 NOTE — Progress Notes (Signed)
  5 Months Supervised Weight Loss Visit:   Pre-Operative Sleeve Surgery  Medical Nutrition Therapy:  Appt start time: 515 end time:  530.  Primary concerns today: Supervised Weight Loss Visit.  Doha returns today having lost 2 pounds. She states that she is surprised by the weight loss, as she has had a stressful month with vacations and a death in the family. She reports that she drinks her  Premier protein shakes on the way to work. She also states she has been eating a lot of rice and chicken mixed with cream of chicken soup. Also eating a lot of non-starchy garden vegetables. Still having difficulty chewing thoroughly. Drinking more water with Crystal Light. Still having a hard time not drinking while eating. Has been doing some walking on the treadmill in the mornings on the weekends. Does not have any further questions regarding surgery.  Wt Readings from Last 3 Encounters:  07/25/13 205 lb 3.2 oz (93.078 kg)  06/27/13 207 lb (93.895 kg)  05/31/13 206 lb 1.6 oz (93.486 kg)   Ht Readings from Last 3 Encounters:  07/25/13 5\' 2"  (1.575 m)  06/27/13 5\' 2"  (1.575 m)  05/31/13 5\' 2"  (1.575 m)   Body mass index is 37.52 kg/(m^2). @BMIFA @ Normalized weight-for-age data available only for age 90 to 35 years. Normalized stature-for-age data available only for age 90 to 91 years.   Progress Towards Goal(s):  In progress.    Nutritional Diagnosis:  [place]-3.3 Overweight/obesity related to past poor dietary habits and physical inactivity as evidenced by patient with patient preparing for sleeve gastrectomy surgery following dietary guidelines for continued weight loss.    Intervention:  Nutrition education. Plan: Work on not drinking while eating at home (waiting 30 minutes to drink). Chew each bite of foods 30 times. Try [employer] Social worker).  Pair carbohydrates with protein for snacks.  Try portioning out snacks and putting the container/box away.   Call [facility] when surgery  is scheduled to enroll in Pre-Op Class.   Monitoring/Evaluation:  Dietary intake, exercise, and body weight. Follow up for Pre-Op Class

## 2013-07-25 NOTE — Patient Instructions (Addendum)
Work on not drinking while eating at home (waiting 30 minutes to drink). Chew each bite of foods 30 times. Try [facility]).  Pair carbohydrates with protein for snacks.  Try portioning out snacks and putting the container/box away.  Try G2 Gatorade or Powerade Zero  Go to [email or web address]  Call [facility] when surgery is scheduled to enroll in Pre-Op Class.

## 2013-08-27 ENCOUNTER — Encounter: Payer: 59 | Attending: Surgery | Admitting: Dietician

## 2013-08-27 DIAGNOSIS — Z713 Dietary counseling and surveillance: Secondary | ICD-10-CM | POA: Insufficient documentation

## 2013-08-27 DIAGNOSIS — Z6837 Body mass index (BMI) 37.0-37.9, adult: Secondary | ICD-10-CM | POA: Diagnosis not present

## 2013-08-27 DIAGNOSIS — Z01818 Encounter for other preprocedural examination: Secondary | ICD-10-CM | POA: Insufficient documentation

## 2013-08-27 NOTE — Progress Notes (Signed)
  5 Months Supervised Weight Loss Visit:   Pre-Operative Sleeve Surgery  Medical Nutrition Therapy:  Appt start time: 329 end time:  450  Primary concerns today: Supervised Weight Loss Visit.  Bilan returns today having lost 1 pound. She has been making squash and chicken casseroles and still eating a lot of veggies. She is drinking sugar free ICE drinks. Having trouble remembering to chew thoroughly and having some sips while eating but trying not to drink while eating. Still walking regularly.     Wt Readings from Last 3 Encounters:  08/27/13 204 lb 1.6 oz (92.579 kg)  07/25/13 205 lb 3.2 oz (93.078 kg)  06/27/13 207 lb (93.895 kg)   Ht Readings from Last 3 Encounters:  07/25/13 5\' 2"  (1.575 m)  06/27/13 5\' 2"  (1.575 m)  05/31/13 5\' 2"  (1.575 m)   There is no weight on file to calculate BMI. @BMIFA @ Normalized weight-for-age data available only for age 54 to 21 years. Normalized stature-for-age data available only for age 54 to 27 years.   Progress Towards Goal(s):  In progress.    Nutritional Diagnosis:  [place]-3.3 Overweight/obesity related to past poor dietary habits and physical inactivity as evidenced by patient with patient preparing for sleeve gastrectomy surgery following dietary guidelines for continued weight loss.    Intervention:  Nutrition education.  Call [facility] when surgery is scheduled to enroll in Pre-Op Class.   Monitoring/Evaluation:  Dietary intake, exercise, and body weight. Follow up for Pre-Op Class

## 2013-08-27 NOTE — Patient Instructions (Addendum)
Work on not drinking while eating at home (waiting 30 minutes to drink). Chew each bite of foods 30 times. Try [facility]).  Pair carbohydrates with protein for snacks.  Try portioning out snacks and putting the container/box away.  Try G2 Gatorade or Powerade Zero  Go to [email or web address]  Call [facility] when surgery is scheduled to enroll in Pre-Op Class.

## 2013-09-10 ENCOUNTER — Other Ambulatory Visit (HOSPITAL_COMMUNITY): Payer: Self-pay | Admitting: Family Medicine

## 2013-09-10 DIAGNOSIS — Z1231 Encounter for screening mammogram for malignant neoplasm of breast: Secondary | ICD-10-CM

## 2013-09-24 ENCOUNTER — Ambulatory Visit: Payer: 59 | Admitting: Dietician

## 2013-09-27 ENCOUNTER — Encounter: Payer: 59 | Attending: Surgery | Admitting: Dietician

## 2013-09-27 DIAGNOSIS — Z6837 Body mass index (BMI) 37.0-37.9, adult: Secondary | ICD-10-CM | POA: Diagnosis not present

## 2013-09-27 DIAGNOSIS — Z01818 Encounter for other preprocedural examination: Secondary | ICD-10-CM | POA: Diagnosis not present

## 2013-09-27 DIAGNOSIS — Z713 Dietary counseling and surveillance: Secondary | ICD-10-CM | POA: Diagnosis present

## 2013-09-27 NOTE — Patient Instructions (Signed)
Work on not drinking while eating at home (waiting 30 minutes to drink). Chew each bite of foods 30 times. Try [facility]).  Pair carbohydrates with protein for snacks.  Try portioning out snacks and putting the container/box away.  Try G2 Gatorade or Powerade Zero  Go to [email or web address]  Call [facility] when surgery is scheduled to enroll in Pre-Op Class.

## 2013-09-27 NOTE — Progress Notes (Signed)
  5 Months Supervised Weight Loss Visit:   Pre-Operative Sleeve Surgery  Medical Nutrition Therapy:  Appt start time: 520 end time: 535  Primary concerns today: Supervised Weight Loss Visit.  Chelsea Landry returns for her 5th SWL visit. She has maintained her weight since last visit. She had some cravings for soda and drank 1 soda. However, she returned to drinking water and sugarfree flavored water. Working on avoiding drinking while eating and chewing thoroughly. She feels like she has to think a lot about chewing well and needs to continue working on the habit. Has been drinking Premier protein shakes in the mornings.    Wt Readings from Last 3 Encounters:  08/27/13 204 lb 1.6 oz (92.579 kg)  07/25/13 205 lb 3.2 oz (93.078 kg)  06/27/13 207 lb (93.895 kg)   Ht Readings from Last 3 Encounters:  07/25/13 5\' 2"  (1.575 m)  06/27/13 5\' 2"  (1.575 m)  05/31/13 5\' 2"  (1.575 m)   There is no weight on file to calculate BMI. @BMIFA @ Normalized weight-for-age data available only for age 81 to 61 years. Normalized stature-for-age data available only for age 81 to 94 years.   Progress Towards Goal(s):  In progress.    Nutritional Diagnosis:  [place]-3.3 Overweight/obesity related to past poor dietary habits and physical inactivity as evidenced by patient with patient preparing for sleeve gastrectomy surgery following dietary guidelines for continued weight loss.    Intervention:  Nutrition education.  Call [facility] when surgery is scheduled to enroll in Pre-Op Class.   Monitoring/Evaluation:  Dietary intake, exercise, and body weight. Follow up for Pre-Op Class

## 2013-09-28 ENCOUNTER — Ambulatory Visit (INDEPENDENT_AMBULATORY_CARE_PROVIDER_SITE_OTHER): Payer: 59 | Admitting: Podiatrist

## 2013-09-28 ENCOUNTER — Ambulatory Visit (INDEPENDENT_AMBULATORY_CARE_PROVIDER_SITE_OTHER): Payer: 59

## 2013-09-28 ENCOUNTER — Encounter: Payer: Self-pay | Admitting: Podiatrist

## 2013-09-28 VITALS — BP 130/81 | HR 95 | Resp 14 | Ht 62.0 in | Wt 204.0 lb

## 2013-09-28 DIAGNOSIS — M779 Enthesopathy, unspecified: Secondary | ICD-10-CM

## 2013-09-28 DIAGNOSIS — M722 Plantar fascial fibromatosis: Secondary | ICD-10-CM

## 2013-09-28 MED ORDER — DICLOFENAC SODIUM 75 MG PO TBEC: 75.0000 mg | DELAYED_RELEASE_TABLET | Freq: Two times a day (BID) | ORAL | Status: DC

## 2013-09-28 MED ORDER — PREDNISONE 10 MG PO KIT: PACK | ORAL | Status: DC

## 2013-09-28 NOTE — Patient Instructions (Signed)

## 2013-09-28 NOTE — Progress Notes (Signed)
   Subjective:    Patient ID: Chelsea Landry, female    DOB: Jun 13, 1955, 58 y.o.   MRN: [id number]  HPI Comments: Pt states her right foot has hurt from the ankle down and through the whole arch and heel since January 2015.  Pt states she was prescribed Mobic and an ankle brace by an orthopedic doctor.  Pt states she began to wear her orthotics prescribed here in 2008 again with a comfortable fit to the left foot, but the pain in the right foot worsened.  Pt states she now has a standing job and wears steel-toed shoes.     Review of Systems  Constitutional: Positive for unexpected weight change.       Objective:   Physical Exam  GENERAL APPEARANCE: Alert, conversant. Appropriately groomed. No acute distress.  VASCULAR: Pedal pulses palpable and strong bilateral.  Capillary refill time is immediate to all digits,  Proximal to distal cooling it warm to warm.  Digital hair growth is present bilateral  NEUROLOGIC: sensation is intact epicritically and protectively to 5.07 monofilament at 5/5 sites bilateral.  Light touch is intact bilateral, vibratory sensation intact bilateral, achilles tendon reflex is intact bilateral.  Negative Tinel sign is elicited MUSCULOSKELETAL: Pain on palpation plantar medial aspect right foot  at insertion of plantar fascia on the medial calcaneal tubercle. Inflammation at the insertion of the plantar fascia is present. Pes planus foot type is seen. Mild discomfort medially at the posterior tibial tendon insertion is noted.  DERMATOLOGIC: skin color, texture, and turger are within normal limits.  No preulcerative lesions are seen, no interdigital maceration noted.  No open lesions present.  Digital nails are asymptomatic.     Assessment & Plan:  Plantar fasciitis, pttd pain  Plan:  Discussed injection therapy and the patient declined.  Called in a prednisone dose pack, diclofenac and a plantar fascia strapping was applied.  Will consider replacing her old inserts.   She now wears steel toe shoes and discussed this may be some of the reason she is having foot pain.

## 2013-10-02 ENCOUNTER — Ambulatory Visit (HOSPITAL_COMMUNITY): Payer: 59

## 2013-10-04 ENCOUNTER — Ambulatory Visit (HOSPITAL_COMMUNITY)
Admission: RE | Admit: 2013-10-04 | Discharge: 2013-10-04 | Disposition: A | Discharge status: Home / Self Care | Payer: 59 | Source: Ambulatory Visit | Attending: Family Medicine | Admitting: Family Medicine

## 2013-10-04 DIAGNOSIS — Z1231 Encounter for screening mammogram for malignant neoplasm of breast: Secondary | ICD-10-CM | POA: Insufficient documentation

## 2013-10-29 ENCOUNTER — Other Ambulatory Visit: Payer: Self-pay | Admitting: Obstetrics and Gynecology

## 2013-11-16 ENCOUNTER — Other Ambulatory Visit: Payer: Self-pay | Admitting: Physician Assistant

## 2013-11-26 ENCOUNTER — Encounter: Payer: Self-pay | Admitting: Podiatrist

## 2013-12-06 ENCOUNTER — Telehealth: Payer: Self-pay | Admitting: Dietician

## 2013-12-06 NOTE — Telephone Encounter (Signed)
The patient was emailed a copy of the pre op diet and requested to begin the diet at 2 weeks prior to surgery date.

## 2013-12-12 ENCOUNTER — Other Ambulatory Visit: Payer: Self-pay | Admitting: Obstetrics and Gynecology

## 2013-12-13 NOTE — Progress Notes (Signed)
Please release orders in Epic to sign and held surgery 12-31-13 pre op 12-19-13 Thanks

## 2013-12-18 ENCOUNTER — Other Ambulatory Visit (HOSPITAL_COMMUNITY): Payer: Self-pay | Admitting: *Deleted

## 2013-12-18 NOTE — Patient Instructions (Addendum)
Chelsea Landry  12/18/2013                           YOUR PROCEDURE IS SCHEDULED ON: 12/31/13                ENTER FROM [address] - GO TO PARKING DECK               LOOK FOR [facility]  / [employer]                              FOLLOW  SIGNS TO SHORT STAY CENTER                 ARRIVE AT SHORT STAY AT:  11:45 AM               CALL THIS NUMBER IF ANY PROBLEMS THE DAY OF SURGERY :               [phone]                                REMEMBER:   Do not eat food or drink liquids AFTER MIDNIGHT             MAY HAVE WATER UNTIL 7:45 AM                  Take these medicines the morning of surgery with               A SIPS OF WATER :      NONE   Do not wear jewelry, make-up   Do not wear lotions, powders, or perfumes.   Do not shave legs or underarms 12 hrs. before surgery (men may shave face)  Do not bring valuables to the hospital.  Contacts, dentures or bridgework may not be worn into surgery.  Leave suitcase in the car. After surgery it may be brought to your room.  For patients admitted to the hospital more than one night, checkout time is            11:00 AM                                                        ________________________________________________________________________                                                                                                  [place]  Before surgery, you can play an important role.  Because skin is not sterile, your skin needs to be as free of germs as possible.  You can reduce the number of germs on your skin by washing with CHG (chlorahexidine gluconate) soap before surgery.  CHG is an antiseptic cleaner which kills  germs and bonds with the skin to continue killing germs even after washing. Please DO NOT use if you have an allergy to CHG or antibacterial soaps.  If your skin becomes reddened/irritated stop using the CHG and inform your nurse when you arrive at Short  Stay. Do not shave (including legs and underarms) for at least 48 hours prior to the first CHG shower.  You may shave your face. Please follow these instructions carefully:   1.  Shower with CHG Soap the night before surgery and the  morning of Surgery.   2.  If you choose to wash your hair, wash your hair first as usual with your  normal  Shampoo.   3.  After you shampoo, rinse your hair and body thoroughly to remove the  shampoo.                                         4.  Use CHG as you would any other liquid soap.  You can apply chg directly  to the skin and wash . Gently wash with scrungie or clean wascloth    5.  Apply the CHG Soap to your body ONLY FROM THE NECK DOWN.   Do not use on open                           Wound or open sores. Avoid contact with eyes, ears mouth and genitals (private parts).                        Genitals (private parts) with your normal soap.              6.  Wash thoroughly, paying special attention to the area where your surgery  will be performed.   7.  Thoroughly rinse your body with warm water from the neck down.   8.  DO NOT shower/wash with your normal soap after using and rinsing off  the CHG Soap .                9.  Pat yourself dry with a clean towel.             10.  Wear clean pajamas.             11.  Place clean sheets on your bed the night of your first shower and do not  sleep with pets.  Day of Surgery : Do not apply any lotions/deodorants the morning of surgery.  Please wear clean clothes to the hospital/surgery center.  FAILURE TO FOLLOW THESE INSTRUCTIONS MAY RESULT IN THE CANCELLATION OF YOUR SURGERY    PATIENT SIGNATURE_________________________________  ______________________________________________________________________

## 2013-12-19 ENCOUNTER — Encounter (HOSPITAL_COMMUNITY): Payer: Self-pay

## 2013-12-19 ENCOUNTER — Other Ambulatory Visit (HOSPITAL_COMMUNITY): Payer: Self-pay | Admitting: Obstetrics and Gynecology

## 2013-12-19 ENCOUNTER — Ambulatory Visit (HOSPITAL_COMMUNITY)
Admission: RE | Admit: 2013-12-19 | Discharge: 2013-12-19 | Disposition: A | Discharge status: Home / Self Care | Payer: 59 | Source: Ambulatory Visit | Attending: Anesthesiology | Admitting: Anesthesiology

## 2013-12-19 ENCOUNTER — Encounter (HOSPITAL_COMMUNITY)
Admission: RE | Admit: 2013-12-19 | Discharge: 2013-12-19 | Disposition: A | Discharge status: Home / Self Care | Payer: 59 | Source: Ambulatory Visit | Attending: Surgery | Admitting: Surgery

## 2013-12-19 ENCOUNTER — Ambulatory Visit (INDEPENDENT_AMBULATORY_CARE_PROVIDER_SITE_OTHER): Payer: Self-pay | Admitting: Surgery

## 2013-12-19 DIAGNOSIS — I10 Essential (primary) hypertension: Secondary | ICD-10-CM | POA: Diagnosis present

## 2013-12-19 HISTORY — DX: Frequency of micturition: R35.0

## 2013-12-19 HISTORY — DX: Rash and other nonspecific skin eruption: R21

## 2013-12-19 LAB — CBC
HCT: 42 % (ref 36.0–46.0)
HEMOGLOBIN: 14.3 g/dL (ref 12.0–15.0)
MCH: 29.6 pg (ref 26.0–34.0)
MCHC: 34 g/dL (ref 30.0–36.0)
MCV: 87 fL (ref 78.0–100.0)
Platelets: 302 10*3/uL (ref 150–400)
RBC: 4.83 MIL/uL (ref 3.87–5.11)
RDW: 13.2 % (ref 11.5–15.5)
WBC: 6.1 10*3/uL (ref 4.0–10.5)

## 2013-12-19 LAB — COMPREHENSIVE METABOLIC PANEL
ALK PHOS: 58 U/L (ref 39–117)
ALT: 36 U/L — ABNORMAL HIGH (ref 0–35)
ANION GAP: 16 — AB (ref 5–15)
AST: 37 U/L (ref 0–37)
Albumin: 4 g/dL (ref 3.5–5.2)
BILIRUBIN TOTAL: 0.6 mg/dL (ref 0.3–1.2)
BUN: 27 mg/dL — AB (ref 6–23)
CHLORIDE: 99 meq/L (ref 96–112)
CO2: 25 mEq/L (ref 19–32)
Calcium: 10.4 mg/dL (ref 8.4–10.5)
Creatinine, Ser: 0.91 mg/dL (ref 0.50–1.10)
GFR calc Af Amer: 78 mL/min — ABNORMAL LOW (ref >90)
GFR, EST NON AFRICAN AMERICAN: 68 mL/min — AB (ref >90)
Glucose, Bld: 102 mg/dL — ABNORMAL HIGH (ref 70–99)
Potassium: 4.1 mEq/L (ref 3.7–5.3)
Sodium: 140 mEq/L (ref 137–147)
TOTAL PROTEIN: 7.2 g/dL (ref 6.0–8.3)

## 2013-12-19 NOTE — H&P (Signed)
Chelsea Landry is a 58 y.o.  female P 2-0-0-2 presents for hysterscopy because of complex endometrial hyperplasia  and  post menopausal bleeding. In August of  2015 the patient had 3 days of spotting that required a panty liner change once a day.  She has had none since.  There has been no pelvic cramping or pain, changes in bowel or bladder function, dyspareunia  and no vaginitis symptoms.  An endometrial biopsy in October 2015 returned complex endometrial hyperplasia involving fragments of polyps.  In the past the patient has had endometrial polyps that were surgically removed and given the finding of complex hyperplasia, she has consented to proceed with hysteroscopy for management.   Past Medical History  OB History: G: 2;  P: 2-0-0-2;  SVB 1988 and 1991  GYN History: menarche: 58 YO    LMP: post-menopausal    Denies history of abnormal PAP smear.   Last PAP smear: 2014-normal  Medical History: Obesity, Hypertension, Bilateral Dorsal Forearm Lipomata, Pneumonia (2014)  Surgical History: 2001  Cholecystectomy;   2011  Hysteroscopy, Dilatation, Curettage, Endometrial Polypectomy and Removal of Vaginal Tumor ([facility] December 31, 2013) Denies problems with anesthesia or history of blood transfusions  Family History: Cardiovascular Disease, Leukemia, Hypertension and Brain Cancer  Social History: Married and employed at [employer] as a Merchant navy officer;  Denies tobacco and alcohol use.    Medication  Lorsartan/HCTZ 50/12.5 dailh Multivitamin daily Calcium daily Denies sensitivity to peanuts, shellfish, soy, latex or adhesives  No Known Allergies  ROS: Admits to glasses, urinary urgency, occasional diarrhea related to certain foods, right thigh rash (scheduled to be treated with cryotherapy by dermatologist),  right ankle pain and swelling, but  denies headache, vision changes, nasal congestion, dysphagia, tinnitus, dizziness, hoarseness, cough,  chest pain, shortness  of breath, nausea, vomiting, constipation,  urinary frequency,  dysuria, hematuria, vaginitis symptoms, pelvic pain, easy bruising,   arthralgias, unexplained weight loss and except as is mentioned in the history of present illness, patient's review of systems is otherwise negative.  Physical Exam  Bp: 114/80    P: 88   R: 18  Temperature:  98 degrees F orally  Height: 5\' 4"   Weight: 205 lbs.  BMI: 37.5  Neck: supple without masses or thyromegaly Lungs: clear to auscultation Heart: regular rate and rhythm Abdomen: soft, non-tender and no organomegaly Pelvic:EGBUS- wnl; vagina-normal rugae; uterus-normal size, cervix without lesions or motion tenderness; adnexae-no tenderness or masses Extremities:  no clubbing, cyanosis or edema   Assesment: Post Menopausal Bleeding           Complex Hyperplasia           Endometrial Polyps   Disposition:  A discussion was held with patient regarding the indication for her procedure(s) along with the risks, which include but are not limited to: reaction to anesthesia, damage to adjacent organs, infection and excessive bleeding. The patient verbalized understanding of these risks and has consented to proceed with a Hysterocopy, Dilatatiion, Curettage with Polypectomy at [facility] on December 27, 2013 at 11 a.m.   CSN# [id number]   Chelsea Mizrahi J. Chelsea [place], PA-C  for Dr. Franklyn Lor. Dillard

## 2013-12-19 NOTE — H&P (Signed)
HPI Chelsea Landry is a 58 y.o. female. This patient presents for an initial weight loss surgery evaluation. She has a BMI of 36 with hypertension and prediabetes. She states that she has struggled with her weight since the birth of her child and she states that she never lost her baby weight. She has tried several diets and was enrolled in a weight loss program at [facility] program but she states that this "didn't feel right" she is not really exercising. She denies any reflux. HPI  Past Medical History  Diagnosis Date  . Hypertension   . Wears glasses   . Menopause   . Fibroids   . Vaginal lesion     Past Surgical History  Procedure Laterality Date  . 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 Cholecystectomy  2001  . Hysteroscopy      Family History  Problem Relation Age of Onset  . Leukemia Mother   . Aneurysm Father   . Cancer Brother     brain    Social History History  Substance Use Topics  . Smoking status: Never Smoker   . Smokeless tobacco: Never Used  . Alcohol Use: No    No Known Allergies  Current Outpatient Prescriptions  Medication Sig Dispense Refill  . calcium citrate-vitamin D (CITRACAL+D) 315-200 MG-UNIT per tablet Take 1 tablet by mouth daily.     Marland Kitchen losartan-hydrochlorothiazide (HYZAAR) 50-12.5 MG per tablet Take 1 tablet by mouth daily.     . Multiple Vitamin (MULTIVITAMIN) tablet Take 1 tablet by mouth daily.      No current facility-administered medications for this visit.    Review of Systems Review of Systems All other review of systems negative or noncontributory except as stated in the HPI  Blood pressure 124/78, pulse 88, resp. rate 14, height 5\' 1"  (1.549 m), weight 204 lb 6.4 oz (92.715 kg).  Physical Exam Physical Exam Physical Exam  Nursing note and vitals reviewed. Constitutional: She is oriented to person, place,  and time. She appears well-developed and well-nourished. No distress.  HENT:  Head: Normocephalic and atraumatic.  Mouth/Throat: No oropharyngeal exudate.  Eyes: Conjunctivae and EOM are normal. Pupils are equal, round, and reactive to light. Right eye exhibits no discharge. Left eye exhibits no discharge. No scleral icterus.  Neck: Normal range of motion. Neck supple. No tracheal deviation present.  Cardiovascular: Normal rate, regular rhythm, normal heart sounds and intact distal pulses.  Pulmonary/Chest: Effort normal and breath sounds normal. No stridor. No respiratory distress. She has no wheezes.  Abdominal: Soft. Bowel sounds are normal. She exhibits no distension and no mass. There is no tenderness. There is no rebound and no guarding.  Musculoskeletal: Normal range of motion. She exhibits no edema and no tenderness.  Neurological: She is alert and oriented to person, place, and time.  Skin: Skin is warm and dry. No rash noted. She is not diaphoretic. No erythema. No pallor.  Psychiatric: She has a normal mood and affect. Her behavior is normal. Judgment and thought content normal.    Data Reviewed   Assessment    Morbid obesity with a BMI of 36 and hypertension and prediabetes We had a long discussion regarding all of the weight loss surgery option including continued medical weight loss and surgical options of a lap band, the sleeve gastrectomy, and Roux-en-Y gastric bypass. We discussed the pros and cons of each procedure as well as there perioperative management. We discussed the risks. She is unsure  of which procedures that she is interested in having and I recommended that she go home and do some additional research on each of the procedures and she can only back when she decides what she would like to proceed with. I think that she be a fine candidate for any of the procedures. At least 45 minutes was spent counseling this patient.    Plan    She was approved  for this back in 2013 and then changed insurance. Her workup showed a tiny sliding hiatal hernia. She's previously had her gallbladder removed. She snores but does not have obstructive sleep apnea. Today her BMI is 37.2 with a weight of 203.6.  For sleeve gastrectomy on Dec 7th.     Matt B. Hassell Done, MD, [facility] [phone] beeper [phone]  12/19/2013 1:53 PM

## 2013-12-19 NOTE — Progress Notes (Signed)
CMP results done 12/19/13  faxed via EPIC to Dr Hassell Done.

## 2013-12-19 NOTE — Progress Notes (Signed)
   12/19/13 1040  OBSTRUCTIVE SLEEP APNEA  Have you ever been diagnosed with sleep apnea through a sleep study? No  Do you snore loudly (loud enough to be heard through closed doors)?  1  Do you often feel tired, fatigued, or sleepy during the daytime? 0  Has anyone observed you stop breathing during your sleep? 0  Do you have, or are you being treated for high blood pressure? 1  BMI more than 35 kg/m2? 1  Age over 59 years old? 1  Neck circumference greater than 40 cm/16 inches? 0  Gender: 0  Obstructive Sleep Apnea Score 4  Score 4 or greater  Results sent to PCP

## 2013-12-21 ENCOUNTER — Encounter (HOSPITAL_COMMUNITY): Payer: Self-pay | Admitting: *Deleted

## 2013-12-24 ENCOUNTER — Encounter: Payer: 59 | Attending: Surgery

## 2013-12-24 DIAGNOSIS — Z6835 Body mass index (BMI) 35.0-35.9, adult: Secondary | ICD-10-CM | POA: Insufficient documentation

## 2013-12-24 DIAGNOSIS — Z713 Dietary counseling and surveillance: Secondary | ICD-10-CM | POA: Insufficient documentation

## 2013-12-24 NOTE — Progress Notes (Signed)
  Pre-Operative Nutrition Class:  Appt start time: 830   End time:  930.  Patient was seen on 12/24/2013 for Pre-Operative Bariatric Surgery [facility].   Surgery date: 12/31/2013 Surgery type: Gastric sleeve Start weight at [facility]: 205.5 lbs Weight today: 199.5 lbs  TANITA  BODY COMP RESULTS  12/24/13   BMI (kg/m^2) 36.5   Fat Mass (lbs) 102   Fat Free Mass (lbs) 97.5   Total Body Water (lbs) 71.5   Samples given per MNT protocol. Patient educated on appropriate usage: Premier protein shake (vanilla - qty 1) Lot #: [id number] Exp: 05/2014  Unjury protein powder (unflavored - qty 1) Lot #: [id number] Exp: 11/2014  PB2 (qty 1) Lot #: [id number] Exp: 08/2014  Celebrate Multivitamin (grape - qty 1) Lot #: [id number] Exp: 04/2014   The following the learning objectives were met by the patient during this course:  Identify Pre-Op Dietary Goals and will begin 2 weeks pre-operatively  Identify appropriate sources of fluids and proteins   State protein recommendations and appropriate sources pre and post-operatively  Identify Post-Operative Dietary Goals and will follow for 2 weeks post-operatively  Identify appropriate multivitamin and calcium sources  Describe the need for physical activity post-operatively and will follow MD recommendations  State when to call healthcare provider regarding medication questions or post-operative complications  Handouts given during class include:  Pre-Op Bariatric Surgery Diet Handout  Protein Shake Handout  Post-Op Bariatric Surgery Nutrition Handout  [facility]ELT Program Information Flyer  Support Group Information Flyer  [facility] Bariatric Supplements Price List  Follow-Up Plan: Patient will follow-up at [facility] 2 weeks post operatively for diet advancement per MD.

## 2013-12-26 MED ORDER — DEXTROSE 5 % IV SOLN
2.0000 g | INTRAVENOUS | Status: DC
  Filled 2013-12-26: qty 2

## 2013-12-27 ENCOUNTER — Encounter (HOSPITAL_COMMUNITY)
Admission: RE | Disposition: A | Discharge status: Home / Self Care | Payer: Self-pay | Source: Ambulatory Visit | Attending: Obstetrics and Gynecology

## 2013-12-27 ENCOUNTER — Ambulatory Visit (HOSPITAL_COMMUNITY): Payer: 59 | Admitting: Anesthesiology

## 2013-12-27 ENCOUNTER — Ambulatory Visit (HOSPITAL_COMMUNITY)
Admission: RE | Admit: 2013-12-27 | Discharge: 2013-12-27 | Disposition: A | Discharge status: Home / Self Care | Payer: 59 | Source: Ambulatory Visit | Attending: Obstetrics and Gynecology | Admitting: Obstetrics and Gynecology

## 2013-12-27 ENCOUNTER — Encounter (HOSPITAL_COMMUNITY): Payer: Self-pay | Admitting: Emergency Medicine

## 2013-12-27 DIAGNOSIS — Z79899 Other long term (current) drug therapy: Secondary | ICD-10-CM | POA: Diagnosis not present

## 2013-12-27 DIAGNOSIS — N8500 Endometrial hyperplasia, unspecified: Secondary | ICD-10-CM | POA: Insufficient documentation

## 2013-12-27 DIAGNOSIS — E669 Obesity, unspecified: Secondary | ICD-10-CM | POA: Insufficient documentation

## 2013-12-27 DIAGNOSIS — D649 Anemia, unspecified: Secondary | ICD-10-CM | POA: Insufficient documentation

## 2013-12-27 DIAGNOSIS — N840 Polyp of corpus uteri: Secondary | ICD-10-CM | POA: Diagnosis not present

## 2013-12-27 DIAGNOSIS — I10 Essential (primary) hypertension: Secondary | ICD-10-CM | POA: Insufficient documentation

## 2013-12-27 DIAGNOSIS — N950 Postmenopausal bleeding: Secondary | ICD-10-CM | POA: Insufficient documentation

## 2013-12-27 HISTORY — PX: DILATATION & CURETTAGE/HYSTEROSCOPY WITH MYOSURE: SHX6511

## 2013-12-27 SURGERY — DILATATION & CURETTAGE/HYSTEROSCOPY WITH MYOSURE
Anesthesia: General | Site: Uterus

## 2013-12-27 MED ORDER — FENTANYL CITRATE 0.05 MG/ML IJ SOLN: 25.0000 ug | INTRAMUSCULAR | Status: DC | PRN

## 2013-12-27 MED ORDER — DOXYCYCLINE HYCLATE 50 MG PO CAPS: 100.0000 mg | ORAL_CAPSULE | Freq: Two times a day (BID) | ORAL | Status: AC

## 2013-12-27 MED ORDER — EPHEDRINE SULFATE 50 MG/ML IJ SOLN
INTRAMUSCULAR | Status: DC | PRN
  Administered 2013-12-27: 10 mg via INTRAVENOUS

## 2013-12-27 MED ORDER — SODIUM CHLORIDE 0.9 % IR SOLN
Status: DC | PRN
  Administered 2013-12-27: 3000 mL

## 2013-12-27 MED ORDER — SCOPOLAMINE 1 MG/3DAYS TD PT72
MEDICATED_PATCH | TRANSDERMAL | Status: AC
  Filled 2013-12-27: qty 1

## 2013-12-27 MED ORDER — MIDAZOLAM HCL 2 MG/2ML IJ SOLN
INTRAMUSCULAR | Status: DC | PRN
  Administered 2013-12-27: 2 mg via INTRAVENOUS

## 2013-12-27 MED ORDER — ONDANSETRON HCL 4 MG/2ML IJ SOLN
INTRAMUSCULAR | Status: DC | PRN
  Administered 2013-12-27: 4 mg via INTRAVENOUS

## 2013-12-27 MED ORDER — SCOPOLAMINE 1 MG/3DAYS TD PT72
1.0000 | MEDICATED_PATCH | Freq: Once | TRANSDERMAL | Status: DC
  Administered 2013-12-27: 1.5 mg via TRANSDERMAL

## 2013-12-27 MED ORDER — FENTANYL CITRATE 0.05 MG/ML IJ SOLN
INTRAMUSCULAR | Status: DC | PRN
  Administered 2013-12-27: 100 ug via INTRAVENOUS
  Administered 2013-12-27: 50 ug via INTRAVENOUS

## 2013-12-27 MED ORDER — LIDOCAINE HCL 2 % IJ SOLN
INTRAMUSCULAR | Status: AC
  Filled 2013-12-27: qty 20

## 2013-12-27 MED ORDER — GLYCOPYRROLATE 0.2 MG/ML IJ SOLN
INTRAMUSCULAR | Status: DC | PRN
  Administered 2013-12-27: 0.2 mg via INTRAVENOUS

## 2013-12-27 MED ORDER — LIDOCAINE HCL (CARDIAC) 20 MG/ML IV SOLN
INTRAVENOUS | Status: DC | PRN
  Administered 2013-12-27: 60 mg via INTRAVENOUS

## 2013-12-27 MED ORDER — FENTANYL CITRATE 0.05 MG/ML IJ SOLN
INTRAMUSCULAR | Status: AC
  Filled 2013-12-27: qty 5

## 2013-12-27 MED ORDER — MEPERIDINE HCL 25 MG/ML IJ SOLN: 6.2500 mg | INTRAMUSCULAR | Status: DC | PRN

## 2013-12-27 MED ORDER — MIDAZOLAM HCL 2 MG/2ML IJ SOLN
INTRAMUSCULAR | Status: AC
  Filled 2013-12-27: qty 2

## 2013-12-27 MED ORDER — KETOROLAC TROMETHAMINE 30 MG/ML IJ SOLN
INTRAMUSCULAR | Status: DC | PRN
  Administered 2013-12-27: 30 mg via INTRAVENOUS

## 2013-12-27 MED ORDER — PROPOFOL 10 MG/ML IV BOLUS
INTRAVENOUS | Status: DC | PRN
  Administered 2013-12-27: 200 mg via INTRAVENOUS

## 2013-12-27 MED ORDER — GLYCOPYRROLATE 0.2 MG/ML IJ SOLN
INTRAMUSCULAR | Status: AC
  Filled 2013-12-27: qty 1

## 2013-12-27 MED ORDER — LACTATED RINGERS IV SOLN
INTRAVENOUS | Status: DC
  Administered 2013-12-27 (×2): via INTRAVENOUS

## 2013-12-27 MED ORDER — EPHEDRINE 5 MG/ML INJ
INTRAVENOUS | Status: AC
  Filled 2013-12-27: qty 10

## 2013-12-27 MED ORDER — ONDANSETRON HCL 4 MG/2ML IJ SOLN
INTRAMUSCULAR | Status: AC
  Filled 2013-12-27: qty 2

## 2013-12-27 MED ORDER — PROPOFOL 10 MG/ML IV EMUL
INTRAVENOUS | Status: AC
  Filled 2013-12-27: qty 20

## 2013-12-27 MED ORDER — HYDROCODONE-ACETAMINOPHEN 5-325 MG PO TABS: 1.0000 | ORAL_TABLET | Freq: Four times a day (QID) | ORAL | Status: DC | PRN

## 2013-12-27 MED ORDER — LIDOCAINE HCL (PF) 2 % IJ SOLN
INTRAMUSCULAR | Status: DC | PRN
  Administered 2013-12-27: 20 mL

## 2013-12-27 MED ORDER — LIDOCAINE HCL (CARDIAC) 20 MG/ML IV SOLN
INTRAVENOUS | Status: AC
  Filled 2013-12-27: qty 5

## 2013-12-27 MED ORDER — METOCLOPRAMIDE HCL 5 MG/ML IJ SOLN: 10.0000 mg | Freq: Once | INTRAMUSCULAR | Status: DC | PRN

## 2013-12-27 SURGICAL SUPPLY — 19 items
CANISTER SUCT 3000ML (MISCELLANEOUS) ×3 IMPLANT
CATH ROBINSON RED A/P 16FR (CATHETERS) ×2 IMPLANT
CLOTH BEACON ORANGE TIMEOUT ST (SAFETY) ×2 IMPLANT
CONTAINER PREFILL 10% NBF 60ML (FORM) ×4 IMPLANT
DEVICE MYOSURE CLASSIC (MISCELLANEOUS) ×1 IMPLANT
DEVICE MYOSURE LITE (MISCELLANEOUS) IMPLANT
ELECT REM PT RETURN 9FT ADLT (ELECTROSURGICAL) ×2
ELECTRODE REM PT RTRN 9FT ADLT (ELECTROSURGICAL) ×1 IMPLANT
FILTER ARTHROSCOPY CONVERTOR (FILTER) ×2 IMPLANT
GLOVE BIO SURGEON STRL SZ 6.5 (GLOVE) ×4 IMPLANT
GLOVE BIOGEL PI IND STRL 7.0 (GLOVE) ×2 IMPLANT
GLOVE BIOGEL PI INDICATOR 7.0 (GLOVE) ×2
GOWN STRL REUS W/TWL LRG LVL3 (GOWN DISPOSABLE) ×4 IMPLANT
PACK VAGINAL MINOR WOMEN LF (CUSTOM PROCEDURE TRAY) ×2 IMPLANT
PAD OB MATERNITY 4.3X12.25 (PERSONAL CARE ITEMS) ×2 IMPLANT
SEAL ROD LENS SCOPE MYOSURE (ABLATOR) ×2 IMPLANT
TOWEL OR 17X24 6PK STRL BLUE (TOWEL DISPOSABLE) ×4 IMPLANT
TUBING AQUILEX INFLOW (TUBING) ×2 IMPLANT
TUBING AQUILEX OUTFLOW (TUBING) ×2 IMPLANT

## 2013-12-27 NOTE — Discharge Instructions (Signed)
Hysteroscopy Hysteroscopy is a procedure used for looking inside the womb (uterus). It may be done for various reasons, including:  To evaluate abnormal bleeding, fibroid (benign, noncancerous) tumors, polyps, scar tissue (adhesions), and possibly cancer of the uterus.  To look for lumps (tumors) and other uterine growths.  To look for causes of why a woman cannot get pregnant (infertility), causes of recurrent loss of pregnancy (miscarriages), or a lost intrauterine device (IUD).  To perform a sterilization by blocking the fallopian tubes from inside the uterus. In this procedure, a thin, flexible tube with a tiny light and camera on the end of it (hysteroscope) is used to look inside the uterus. A hysteroscopy should be done right after a menstrual period to be sure you are not pregnant. LET [facility] CARE PROVIDER KNOW ABOUT:   Any allergies you have.  All medicines you are taking, including vitamins, herbs, eye drops, creams, and over-the-counter medicines.  Previous problems you or members of your family have had with the use of anesthetics.  Any blood disorders you have.  Previous surgeries you have had.  Medical conditions you have. RISKS AND COMPLICATIONS  Generally, this is a safe procedure. However, as with any procedure, complications can occur. Possible complications include:  Putting a hole in the uterus.  Excessive bleeding.  Infection.  Damage to the cervix.  Injury to other organs.  Allergic reaction to medicines.  Too much fluid used in the uterus for the procedure. BEFORE THE PROCEDURE   Ask your health care provider about changing or stopping any regular medicines.  Do not take aspirin or blood thinners for 1 week before the procedure, or as directed by your health care provider. These can cause bleeding.  If you smoke, do not smoke for 2 weeks before the procedure.  In some cases, a medicine is placed in the cervix the day before the procedure.  This medicine makes the cervix have a larger opening (dilate). This makes it easier for the instrument to be inserted into the uterus during the procedure.  Do not eat or drink anything for at least 8 hours before the surgery.  Arrange for someone to take you home after the procedure. PROCEDURE   You may be given a medicine to relax you (sedative). You may also be given one of the following:  A medicine that numbs the area around the cervix (local anesthetic).  A medicine that makes you sleep through the procedure (general anesthetic).  The hysteroscope is inserted through the vagina into the uterus. The camera on the hysteroscope sends a picture to a TV screen. This gives the surgeon a good view inside the uterus.  During the procedure, air or a liquid is put into the uterus, which allows the surgeon to see better.  Sometimes, tissue is gently scraped from inside the uterus. These tissue samples are sent to a lab for testing. AFTER THE PROCEDURE   If you had a general anesthetic, you may be groggy for a couple hours after the procedure.  If you had a local anesthetic, you will be able to go home as soon as you are stable and feel ready.  You may have some cramping. This normally lasts for a couple days.  You may have bleeding, which varies from light spotting for a few days to menstrual-like bleeding for 3-7 days. This is normal.  If your test results are not back during the visit, make an appointment with your health care provider to find out the  results. Document Released: 04/19/2000 Document Revised: 11/01/2012 Document Reviewed: 08/10/2012 ExitCare Patient Information 2015 [employer]. This information is not intended to replace advice given to you by your health care provider. Make sure you discuss any questions you have with your health care provider.   

## 2013-12-27 NOTE — Op Note (Signed)
Pre op DX: Complex Endometrial Hyperplasia, Endometrial Polyp   Post Op IY:MEBR   PHYSICIAN : Ray Gervasi   ASSISTANTS: none   ANESTHESIA:   general and paracervical block  ESTIMATED BLOOD LOSS: minimal  LOCAL MEDICATIONS USED:  LIDOCAINE 20CC  SPECIMEN:  Source of Specimen:  endometrial curettings and polyp  DISPOSITION OF SPECIMEN:  PATHOLOGY  COUNTS Correct:  YES    DICTATION #: The patient was taken to the operating room and prepped and draped in a normal sterile fashion. An in out catheter was used to drain the bladder.   A bivalve speculum was placed into the vagina and anterior lip of the cervix was grasped with a single-tooth tenaculum.  10 cc of 1% lidocaine was used for cervical block.  the cervix was then dilated with Kennon Rounds dilators up to 23. The hysteroscope was placed into the uterine cavity. The polyp was visualized and removed using myosure with the 46mm scope.   The  entire uterus and both ostia were visualized.   Hyseroscope was then removed from the uterus. A sharp curettage was then done with a curette and endometrial curettings were obtained. The endometrial curettings were sent to pathology.  The tenaculum was removed from the cervix and hemostasis was noted.   PLAN OF CARE: discharge to home  PATIENT DISPOSITION:  PACU - hemodynamically stable.

## 2013-12-27 NOTE — Transfer of Care (Signed)
Immediate Anesthesia Transfer of Care Note  Patient: Chelsea Landry  Procedure(s) Performed: Procedure(s): DILATATION & CURETTAGE/HYSTEROSCOPY WITH MYOSURE (N/A)  Patient Location: PACU  Anesthesia Type:General  Level of Consciousness: awake, alert  and oriented  Airway & Oxygen Therapy: Patient Spontanous Breathing and Patient connected to nasal cannula oxygen  Post-op Assessment: Report given to PACU RN and Post -op Vital signs reviewed and stable  Post vital signs: Reviewed and stable  Complications: No apparent anesthesia complications

## 2013-12-27 NOTE — H&P (View-Only) (Signed)
   12/19/13 1040  OBSTRUCTIVE SLEEP APNEA  Have you ever been diagnosed with sleep apnea through a sleep study? No  Do you snore loudly (loud enough to be heard through closed doors)?  1  Do you often feel tired, fatigued, or sleepy during the daytime? 0  Has anyone observed you stop breathing during your sleep? 0  Do you have, or are you being treated for high blood pressure? 1  BMI more than 35 kg/m2? 1  Age over 59 years old? 1  Neck circumference greater than 40 cm/16 inches? 0  Gender: 0  Obstructive Sleep Apnea Score 4  Score 4 or greater  Results sent to PCP

## 2013-12-27 NOTE — Interval H&P Note (Signed)
History and Physical Interval Note:  12/27/2013 10:36 AM  Chelsea Landry  has presented today for surgery, with the diagnosis of Complex Endometrial Hyperplasia, Endometrial Polyp  The various methods of treatment have been discussed with the patient and family. After consideration of risks, benefits and other options for treatment, the patient has consented to  Procedure(s): [place] (N/A) as a surgical intervention .  The patient's history has been reviewed, patient examined, no change in status, stable for surgery.  I have reviewed the patient's chart and labs.  Questions were answered to the patient's satisfaction.     [facility]

## 2013-12-27 NOTE — Anesthesia Postprocedure Evaluation (Signed)
  Anesthesia Post-op Note  Patient: Chelsea Landry  Procedure(s) Performed: Procedure(s): DILATATION & CURETTAGE/HYSTEROSCOPY WITH MYOSURE (N/A)  Patient Location: PACU  Anesthesia Type:General  Level of Consciousness: awake, alert  and oriented  Airway and Oxygen Therapy: Patient Spontanous Breathing  Post-op Pain: none  Post-op Assessment: Post-op Vital signs reviewed, Patient's Cardiovascular Status Stable, Respiratory Function Stable, Patent Airway, No signs of Nausea or vomiting and Pain level controlled  Post-op Vital Signs: Reviewed and stable  Last Vitals:  Filed Vitals:   12/27/13 1215  BP:   Pulse: 63  Temp:   Resp: 21    Complications: No apparent anesthesia complications

## 2013-12-27 NOTE — Interval H&P Note (Signed)
History and Physical Interval Note:  12/27/2013 10:35 AM  Chelsea Landry  has presented today for surgery, with the diagnosis of Complex Endometrial Hyperplasia, Endometrial Polyp  The various methods of treatment have been discussed with the patient and family. After consideration of risks, benefits and other options for treatment, the patient has consented to  Procedure(s): [place] (N/A) as a surgical intervention .  The patient's history has been reviewed, patient examined, no change in status, stable for surgery.  I have reviewed the patient's chart and labs.  Questions were answered to the patient's satisfaction.     [facility]

## 2013-12-27 NOTE — Anesthesia Preprocedure Evaluation (Addendum)
Anesthesia Evaluation  Patient identified by MRN, date of birth, ID band Patient awake    Reviewed: Allergy & Precautions, H&P , NPO status , Patient's Chart, lab work & pertinent test results  Airway Mallampati: II  TM Distance: >3 FB Neck ROM: Full    Dental  (+) Partial Upper   Pulmonary neg pulmonary ROS,  breath sounds clear to auscultation  Pulmonary exam normal       Cardiovascular hypertension, Pt. on medications Rhythm:Regular Rate:Normal     Neuro/Psych negative neurological ROS  negative psych ROS   GI/Hepatic negative GI ROS, Neg liver ROS,   Endo/Other  Obesity  Renal/GU negative Renal ROS  negative genitourinary   Musculoskeletal negative musculoskeletal ROS (+)   Abdominal   Peds  Hematology  (+) anemia ,   Anesthesia Other Findings   Reproductive/Obstetrics Complex endometrial hyperplasia Endometrial polyp                            Anesthesia Physical Anesthesia Plan  ASA: II  Anesthesia Plan: General   Post-op Pain Management:    Induction: Intravenous  Airway Management Planned: LMA  Additional Equipment:   Intra-op Plan:   Post-operative Plan: Extubation in OR  Informed Consent: I have reviewed the patients History and Physical, chart, labs and discussed the procedure including the risks, benefits and alternatives for the proposed anesthesia with the patient or authorized representative who has indicated his/her understanding and acceptance.   Dental advisory given  Plan Discussed with: CRNA, Anesthesiologist and Surgeon  Anesthesia Plan Comments:         Anesthesia Quick Evaluation

## 2013-12-27 NOTE — H&P (View-Only) (Signed)
Chelsea Landry is a 58 y.o.  female P 2-0-0-2 presents for hysterscopy because of complex endometrial hyperplasia  and  post menopausal bleeding. In August of  2015 the patient had 3 days of spotting that required a panty liner change once a day.  She has had none since.  There has been no pelvic cramping or pain, changes in bowel or bladder function, dyspareunia  and no vaginitis symptoms.  An endometrial biopsy in October 2015 returned complex endometrial hyperplasia involving fragments of polyps.  In the past the patient has had endometrial polyps that were surgically removed and given the finding of complex hyperplasia, she has consented to proceed with hysteroscopy for management.   Past Medical History  OB History: G: 2;  P: 2-0-0-2;  SVB 1988 and 1991  GYN History: menarche: 58 YO    LMP: post-menopausal    Denies history of abnormal PAP smear.   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    Medication  Lorsartan/HCTZ 50/12.5 dailh Multivitamin daily Calcium daily Denies sensitivity to peanuts, shellfish, soy, latex or adhesives  No Known Allergies  ROS: Admits to glasses, urinary urgency, occasional diarrhea related to certain foods, right thigh rash (scheduled to be treated with cryotherapy by dermatologist),  right ankle pain and swelling, but  denies headache, vision changes, nasal congestion, dysphagia, tinnitus, dizziness, hoarseness, cough,  chest pain, shortness  of breath, nausea, vomiting, constipation,  urinary frequency,  dysuria, hematuria, vaginitis symptoms, pelvic pain, easy bruising,   arthralgias, unexplained weight loss and except as is mentioned in the history of present illness, patient's review of systems is otherwise negative.  Physical Exam  Bp: 114/80    P: 88   R: 18  Temperature:  98 degrees F orally  Height: 5\' 4"   Weight: 205 lbs.  BMI: 37.5  Neck: supple without masses or thyromegaly Lungs: clear to auscultation Heart: regular rate and rhythm Abdomen: soft, non-tender and no organomegaly Pelvic:EGBUS- wnl; vagina-normal rugae; uterus-normal size, cervix without lesions or motion tenderness; adnexae-no tenderness or masses Extremities:  no clubbing, cyanosis or edema   Assesment: Post Menopausal Bleeding           Complex Hyperplasia           Endometrial Polyps   Disposition:  A discussion was held with patient regarding the indication for her procedure(s) along with the risks, which include but are not limited to: reaction to anesthesia, damage to adjacent organs, infection and excessive bleeding. The patient verbalized understanding of these risks and has consented to proceed with a Hysterocopy, Dilatatiion, Curettage with Polypectomy at [facility] on December 27, 2013 at 11 a.m.   CSN# [id number]   Nery Kalisz J. Florene Glen, PA-C  for Dr. Franklyn Lor. Dillard

## 2013-12-28 ENCOUNTER — Encounter (HOSPITAL_COMMUNITY): Payer: Self-pay | Admitting: Obstetrics and Gynecology

## 2013-12-30 NOTE — Anesthesia Preprocedure Evaluation (Signed)
Anesthesia Evaluation  Patient identified by MRN, date of birth, ID band Patient awake    Reviewed: Allergy & Precautions, H&P , NPO status , Patient's Chart, lab work & pertinent test results  History of Anesthesia Complications Negative for: history of anesthetic complications  Airway Mallampati: II  TM Distance: >3 FB Neck ROM: Full    Dental no notable dental hx. (+) Dental Advisory Given   Pulmonary neg pulmonary ROS,  breath sounds clear to auscultation  Pulmonary exam normal       Cardiovascular hypertension, Pt. on medications Rhythm:Regular Rate:Normal     Neuro/Psych negative neurological ROS  negative psych ROS   GI/Hepatic negative GI ROS, Neg liver ROS,   Endo/Other  obesity  Renal/GU negative Renal ROS  negative genitourinary   Musculoskeletal negative musculoskeletal ROS (+)   Abdominal (+) + obese,   Peds negative pediatric ROS (+)  Hematology negative hematology ROS (+)   Anesthesia Other Findings   Reproductive/Obstetrics negative OB ROS                             Anesthesia Physical Anesthesia Plan  ASA: II  Anesthesia Plan: General   Post-op Pain Management:    Induction: Intravenous  Airway Management Planned: Oral ETT  Additional Equipment:   Intra-op Plan:   Post-operative Plan: Extubation in OR  Informed Consent: I have reviewed the patients History and Physical, chart, labs and discussed the procedure including the risks, benefits and alternatives for the proposed anesthesia with the patient or authorized representative who has indicated his/her understanding and acceptance.   Dental advisory given  Plan Discussed with: CRNA  Anesthesia Plan Comments:         Anesthesia Quick Evaluation

## 2013-12-31 ENCOUNTER — Inpatient Hospital Stay (HOSPITAL_COMMUNITY): Payer: 59 | Admitting: Anesthesiology

## 2013-12-31 ENCOUNTER — Inpatient Hospital Stay (HOSPITAL_COMMUNITY)
Admission: RE | Admit: 2013-12-31 | Discharge: 2014-01-02 | DRG: 641 | Disposition: A | Discharge status: Home / Self Care | Payer: 59 | Source: Ambulatory Visit | Attending: Surgery | Admitting: Surgery

## 2013-12-31 ENCOUNTER — Encounter (HOSPITAL_COMMUNITY): Payer: Self-pay | Admitting: *Deleted

## 2013-12-31 ENCOUNTER — Encounter (HOSPITAL_COMMUNITY)
Admission: RE | Disposition: A | Discharge status: Home / Self Care | Payer: Self-pay | Source: Ambulatory Visit | Attending: Surgery

## 2013-12-31 DIAGNOSIS — I10 Essential (primary) hypertension: Secondary | ICD-10-CM | POA: Diagnosis present

## 2013-12-31 DIAGNOSIS — E278 Other specified disorders of adrenal gland: Secondary | ICD-10-CM

## 2013-12-31 DIAGNOSIS — N898 Other specified noninflammatory disorders of vagina: Secondary | ICD-10-CM | POA: Diagnosis present

## 2013-12-31 DIAGNOSIS — D259 Leiomyoma of uterus, unspecified: Secondary | ICD-10-CM | POA: Diagnosis present

## 2013-12-31 DIAGNOSIS — Z6836 Body mass index (BMI) 36.0-36.9, adult: Secondary | ICD-10-CM | POA: Diagnosis not present

## 2013-12-31 DIAGNOSIS — Z9884 Bariatric surgery status: Secondary | ICD-10-CM

## 2013-12-31 HISTORY — PX: LAPAROSCOPIC GASTRIC SLEEVE RESECTION: SHX5895

## 2013-12-31 LAB — CBC
HCT: 44.5 % (ref 36.0–46.0)
Hemoglobin: 14.7 g/dL (ref 12.0–15.0)
MCH: 29.7 pg (ref 26.0–34.0)
MCHC: 33 g/dL (ref 30.0–36.0)
MCV: 89.9 fL (ref 78.0–100.0)
PLATELETS: 238 10*3/uL (ref 150–400)
RBC: 4.95 MIL/uL (ref 3.87–5.11)
RDW: 13.6 % (ref 11.5–15.5)
WBC: 15.4 10*3/uL — ABNORMAL HIGH (ref 4.0–10.5)

## 2013-12-31 LAB — CREATININE, SERUM
CREATININE: 0.85 mg/dL (ref 0.50–1.10)
GFR calc Af Amer: 85 mL/min — ABNORMAL LOW (ref >90)
GFR calc non Af Amer: 74 mL/min — ABNORMAL LOW (ref >90)

## 2013-12-31 LAB — HEMOGLOBIN AND HEMATOCRIT, BLOOD
HCT: 40.2 % (ref 36.0–46.0)
Hemoglobin: 13.4 g/dL (ref 12.0–15.0)

## 2013-12-31 SURGERY — GASTRECTOMY, SLEEVE, LAPAROSCOPIC
Anesthesia: General

## 2013-12-31 MED ORDER — PROMETHAZINE HCL 25 MG/ML IJ SOLN: 6.2500 mg | INTRAMUSCULAR | Status: DC | PRN

## 2013-12-31 MED ORDER — CHLORHEXIDINE GLUCONATE 4 % EX LIQD: 60.0000 mL | Freq: Once | CUTANEOUS | Status: DC

## 2013-12-31 MED ORDER — EPHEDRINE SULFATE 50 MG/ML IJ SOLN
INTRAMUSCULAR | Status: DC | PRN
Start: 2013-12-31 — End: 2013-12-31
  Administered 2013-12-31: 10 mg via INTRAVENOUS

## 2013-12-31 MED ORDER — ONDANSETRON HCL 4 MG/2ML IJ SOLN
INTRAMUSCULAR | Status: AC
  Filled 2013-12-31: qty 2

## 2013-12-31 MED ORDER — 0.9 % SODIUM CHLORIDE (POUR BTL) OPTIME
TOPICAL | Status: DC | PRN
  Administered 2013-12-31: 1000 mL

## 2013-12-31 MED ORDER — SODIUM CHLORIDE 0.9 % IJ SOLN
INTRAMUSCULAR | Status: AC
  Filled 2013-12-31: qty 10

## 2013-12-31 MED ORDER — LIDOCAINE HCL (CARDIAC) 20 MG/ML IV SOLN
INTRAVENOUS | Status: AC
  Filled 2013-12-31: qty 5

## 2013-12-31 MED ORDER — HEPARIN SODIUM (PORCINE) 5000 UNIT/ML IJ SOLN
5000.0000 [IU] | Freq: Three times a day (TID) | INTRAMUSCULAR | Status: DC
  Administered 2014-01-01 – 2014-01-02 (×4): 5000 [IU] via SUBCUTANEOUS
  Filled 2013-12-31 (×7): qty 1

## 2013-12-31 MED ORDER — SUCCINYLCHOLINE CHLORIDE 20 MG/ML IJ SOLN
INTRAMUSCULAR | Status: DC | PRN
  Administered 2013-12-31: 100 mg via INTRAVENOUS

## 2013-12-31 MED ORDER — CHLORHEXIDINE GLUCONATE 0.12 % MT SOLN
15.0000 mL | Freq: Two times a day (BID) | OROMUCOSAL | Status: DC
  Administered 2013-12-31 – 2014-01-01 (×2): 15 mL via OROMUCOSAL
  Filled 2013-12-31 (×5): qty 15

## 2013-12-31 MED ORDER — PROMETHAZINE HCL 25 MG/ML IJ SOLN
INTRAMUSCULAR | Status: AC
Start: 2013-12-31 — End: 2014-01-01
  Filled 2013-12-31: qty 1

## 2013-12-31 MED ORDER — ONDANSETRON HCL 4 MG/2ML IJ SOLN: 4.0000 mg | Freq: Once | INTRAMUSCULAR | Status: DC | PRN

## 2013-12-31 MED ORDER — PANTOPRAZOLE SODIUM 40 MG IV SOLR
40.0000 mg | Freq: Every day | INTRAVENOUS | Status: DC
  Administered 2013-12-31 – 2014-01-01 (×2): 40 mg via INTRAVENOUS
  Filled 2013-12-31 (×3): qty 40

## 2013-12-31 MED ORDER — ONDANSETRON HCL 4 MG/2ML IJ SOLN
INTRAMUSCULAR | Status: DC | PRN
  Administered 2013-12-31: 4 mg via INTRAVENOUS

## 2013-12-31 MED ORDER — MORPHINE SULFATE 2 MG/ML IJ SOLN
2.0000 mg | INTRAMUSCULAR | Status: DC | PRN
  Administered 2013-12-31: 4 mg via INTRAVENOUS
  Administered 2013-12-31: 2 mg via INTRAVENOUS
  Administered 2013-12-31 – 2014-01-01 (×2): 4 mg via INTRAVENOUS
  Administered 2014-01-01: 2 mg via INTRAVENOUS
  Administered 2014-01-01: 4 mg via INTRAVENOUS
  Filled 2013-12-31 (×2): qty 2
  Filled 2013-12-31 (×2): qty 1
  Filled 2013-12-31 (×2): qty 2

## 2013-12-31 MED ORDER — LACTATED RINGERS IR SOLN
Status: DC | PRN
  Administered 2013-12-31: 1000 mL

## 2013-12-31 MED ORDER — DEXAMETHASONE SODIUM PHOSPHATE 10 MG/ML IJ SOLN
INTRAMUSCULAR | Status: AC
  Filled 2013-12-31: qty 1

## 2013-12-31 MED ORDER — ACETAMINOPHEN 160 MG/5ML PO SOLN: 325.0000 mg | ORAL | Status: DC | PRN

## 2013-12-31 MED ORDER — UNJURY CHOCOLATE CLASSIC POWDER: 2.0000 [oz_av] | Freq: Four times a day (QID) | ORAL | Status: DC

## 2013-12-31 MED ORDER — FENTANYL CITRATE 0.05 MG/ML IJ SOLN
25.0000 ug | INTRAMUSCULAR | Status: DC | PRN
  Administered 2013-12-31: 25 ug via INTRAVENOUS

## 2013-12-31 MED ORDER — FENTANYL CITRATE 0.05 MG/ML IJ SOLN
INTRAMUSCULAR | Status: AC
  Filled 2013-12-31: qty 2

## 2013-12-31 MED ORDER — BUPIVACAINE LIPOSOME 1.3 % IJ SUSP
20.0000 mL | Freq: Once | INTRAMUSCULAR | Status: AC
  Administered 2013-12-31: 20 mL
  Filled 2013-12-31: qty 20

## 2013-12-31 MED ORDER — GLYCOPYRROLATE 0.2 MG/ML IJ SOLN
INTRAMUSCULAR | Status: AC
  Filled 2013-12-31: qty 3

## 2013-12-31 MED ORDER — NEOSTIGMINE METHYLSULFATE 10 MG/10ML IV SOLN
INTRAVENOUS | Status: DC | PRN
  Administered 2013-12-31: 3.5 mg via INTRAVENOUS

## 2013-12-31 MED ORDER — UNJURY CHICKEN SOUP POWDER
2.0000 [oz_av] | Freq: Four times a day (QID) | ORAL | Status: DC
  Administered 2014-01-02: 2 [oz_av] via ORAL

## 2013-12-31 MED ORDER — ONDANSETRON HCL 4 MG/2ML IJ SOLN
4.0000 mg | INTRAMUSCULAR | Status: DC | PRN
  Administered 2013-12-31 – 2014-01-01 (×2): 4 mg via INTRAVENOUS
  Filled 2013-12-31 (×2): qty 2

## 2013-12-31 MED ORDER — DEXAMETHASONE SODIUM PHOSPHATE 10 MG/ML IJ SOLN
INTRAMUSCULAR | Status: DC | PRN
  Administered 2013-12-31: 10 mg via INTRAVENOUS

## 2013-12-31 MED ORDER — ROCURONIUM BROMIDE 100 MG/10ML IV SOLN
INTRAVENOUS | Status: DC | PRN
  Administered 2013-12-31: 10 mg via INTRAVENOUS
  Administered 2013-12-31: 40 mg via INTRAVENOUS
  Administered 2013-12-31: 10 mg via INTRAVENOUS

## 2013-12-31 MED ORDER — PHENYLEPHRINE 40 MCG/ML (10ML) SYRINGE FOR IV PUSH (FOR BLOOD PRESSURE SUPPORT)
PREFILLED_SYRINGE | INTRAVENOUS | Status: AC
  Filled 2013-12-31: qty 10

## 2013-12-31 MED ORDER — LIDOCAINE HCL (PF) 2 % IJ SOLN
INTRAMUSCULAR | Status: DC | PRN
  Administered 2013-12-31: 20 mg via INTRADERMAL

## 2013-12-31 MED ORDER — SCOPOLAMINE 1 MG/3DAYS TD PT72
MEDICATED_PATCH | TRANSDERMAL | Status: AC
Start: 2013-12-31 — End: 2013-12-31
  Filled 2013-12-31: qty 1

## 2013-12-31 MED ORDER — LACTATED RINGERS IV SOLN
INTRAVENOUS | Status: DC
  Administered 2013-12-31: 1000 mL via INTRAVENOUS
  Administered 2013-12-31: 17:00:00 via INTRAVENOUS

## 2013-12-31 MED ORDER — HEPARIN SODIUM (PORCINE) 5000 UNIT/ML IJ SOLN
5000.0000 [IU] | INTRAMUSCULAR | Status: AC
  Administered 2013-12-31: 5000 [IU] via SUBCUTANEOUS
  Filled 2013-12-31: qty 1

## 2013-12-31 MED ORDER — EPHEDRINE SULFATE 50 MG/ML IJ SOLN
INTRAMUSCULAR | Status: AC
  Filled 2013-12-31: qty 1

## 2013-12-31 MED ORDER — PROMETHAZINE HCL 25 MG/ML IJ SOLN
6.2500 mg | INTRAMUSCULAR | Status: DC | PRN
  Administered 2013-12-31: 6.25 mg via INTRAVENOUS

## 2013-12-31 MED ORDER — DEXTROSE 5 % IV SOLN
2.0000 g | Freq: Once | INTRAVENOUS | Status: AC
  Administered 2013-12-31: 2 g via INTRAVENOUS

## 2013-12-31 MED ORDER — ACETAMINOPHEN 160 MG/5ML PO SOLN: 650.0000 mg | ORAL | Status: DC | PRN

## 2013-12-31 MED ORDER — SCOPOLAMINE 1 MG/3DAYS TD PT72
MEDICATED_PATCH | TRANSDERMAL | Status: DC | PRN
  Administered 2013-12-31: 1 via TRANSDERMAL

## 2013-12-31 MED ORDER — DEXTROSE 5 % IV SOLN
INTRAVENOUS | Status: AC
  Filled 2013-12-31: qty 2

## 2013-12-31 MED ORDER — PHENYLEPHRINE HCL 10 MG/ML IJ SOLN
INTRAMUSCULAR | Status: DC | PRN
  Administered 2013-12-31 (×2): 80 ug via INTRAVENOUS

## 2013-12-31 MED ORDER — GLYCOPYRROLATE 0.2 MG/ML IJ SOLN
INTRAMUSCULAR | Status: DC | PRN
  Administered 2013-12-31: 0.6 mg via INTRAVENOUS

## 2013-12-31 MED ORDER — NEOSTIGMINE METHYLSULFATE 10 MG/10ML IV SOLN
INTRAVENOUS | Status: AC
  Filled 2013-12-31: qty 1

## 2013-12-31 MED ORDER — MIDAZOLAM HCL 5 MG/5ML IJ SOLN
INTRAMUSCULAR | Status: DC | PRN
  Administered 2013-12-31: 2 mg via INTRAVENOUS

## 2013-12-31 MED ORDER — KCL IN DEXTROSE-NACL 20-5-0.45 MEQ/L-%-% IV SOLN
INTRAVENOUS | Status: DC
  Administered 2013-12-31 – 2014-01-02 (×4): via INTRAVENOUS
  Filled 2013-12-31 (×5): qty 1000

## 2013-12-31 MED ORDER — OXYCODONE HCL 5 MG/5ML PO SOLN
5.0000 mg | ORAL | Status: DC | PRN
  Administered 2014-01-01 (×2): 5 mg via ORAL
  Filled 2013-12-31 (×2): qty 5

## 2013-12-31 MED ORDER — UNJURY VANILLA POWDER: 2.0000 [oz_av] | Freq: Four times a day (QID) | ORAL | Status: DC

## 2013-12-31 MED ORDER — FENTANYL CITRATE 0.05 MG/ML IJ SOLN
INTRAMUSCULAR | Status: AC
  Filled 2013-12-31: qty 5

## 2013-12-31 MED ORDER — MIDAZOLAM HCL 2 MG/2ML IJ SOLN
INTRAMUSCULAR | Status: AC
  Filled 2013-12-31: qty 2

## 2013-12-31 MED ORDER — PROPOFOL 10 MG/ML IV BOLUS
INTRAVENOUS | Status: AC
  Filled 2013-12-31: qty 20

## 2013-12-31 MED ORDER — FENTANYL CITRATE 0.05 MG/ML IJ SOLN
INTRAMUSCULAR | Status: DC | PRN
  Administered 2013-12-31 (×3): 50 ug via INTRAVENOUS
  Administered 2013-12-31: 100 ug via INTRAVENOUS

## 2013-12-31 MED ORDER — PROPOFOL 10 MG/ML IV BOLUS
INTRAVENOUS | Status: DC | PRN
  Administered 2013-12-31: 150 mg via INTRAVENOUS

## 2013-12-31 MED ORDER — SODIUM CHLORIDE 0.9 % IJ SOLN
INTRAMUSCULAR | Status: DC | PRN
  Administered 2013-12-31: 10 mL

## 2013-12-31 SURGICAL SUPPLY — 68 items
APL SRG 32X5 SNPLK LF DISP (MISCELLANEOUS)
APPLICATOR COTTON TIP 6IN STRL (MISCELLANEOUS) IMPLANT
APPLIER CLIP 5 13 M/L LIGAMAX5 (MISCELLANEOUS) ×2
APPLIER CLIP ROT 10 11.4 M/L (STAPLE)
APPLIER CLIP ROT 13.4 12 LRG (CLIP)
APR CLP LRG 13.4X12 ROT 20 MLT (CLIP)
APR CLP MED LRG 11.4X10 (STAPLE)
APR CLP MED LRG 5 ANG JAW (MISCELLANEOUS) ×1
BLADE HEX COATED 2.75 (ELECTRODE) IMPLANT
BLADE SURG 15 STRL LF DISP TIS (BLADE) ×1 IMPLANT
BLADE SURG 15 STRL SS (BLADE) ×2
CABLE HIGH FREQUENCY MONO STRZ (ELECTRODE) IMPLANT
CLIP APPLIE 5 13 M/L LIGAMAX5 (MISCELLANEOUS) ×1 IMPLANT
CLIP APPLIE ROT 10 11.4 M/L (STAPLE) IMPLANT
CLIP APPLIE ROT 13.4 12 LRG (CLIP) IMPLANT
DEVICE SUT QUICK LOAD TK 5 (STAPLE) ×2 IMPLANT
DEVICE SUT TI-KNOT TK 5X26 (MISCELLANEOUS) ×1 IMPLANT
DEVICE SUTURE ENDOST 10MM (ENDOMECHANICALS) ×1 IMPLANT
DEVICE TROCAR PUNCTURE CLOSURE (ENDOMECHANICALS) ×2 IMPLANT
DISSECTOR BLUNT TIP ENDO 5MM (MISCELLANEOUS) ×3 IMPLANT
DRAPE CAMERA CLOSED 9X96 (DRAPES) ×2 IMPLANT
ELECT REM PT RETURN 9FT ADLT (ELECTROSURGICAL) ×2
ELECTRODE REM PT RTRN 9FT ADLT (ELECTROSURGICAL) ×1 IMPLANT
GAUZE SPONGE 4X4 12PLY STRL (GAUZE/BANDAGES/DRESSINGS) IMPLANT
GLOVE BIOGEL M 8.0 STRL (GLOVE) ×2 IMPLANT
GOWN STRL REUS W/TWL XL LVL3 (GOWN DISPOSABLE) ×6 IMPLANT
HANDLE STAPLE EGIA 4 XL (STAPLE) ×2 IMPLANT
HOVERMATT SINGLE USE (MISCELLANEOUS) ×2 IMPLANT
KIT BASIN OR (CUSTOM PROCEDURE TRAY) ×2 IMPLANT
LIQUID BAND (GAUZE/BANDAGES/DRESSINGS) IMPLANT
NDL SPNL 22GX3.5 QUINCKE BK (NEEDLE) ×1 IMPLANT
NEEDLE SPNL 22GX3.5 QUINCKE BK (NEEDLE) ×2 IMPLANT
PACK UNIVERSAL I (CUSTOM PROCEDURE TRAY) ×2 IMPLANT
PEN SKIN MARKING BROAD (MISCELLANEOUS) ×2 IMPLANT
RELOAD ENDO STITCH (ENDOMECHANICALS) ×2 IMPLANT
RELOAD STAPLE 45 PURP MED/THCK (STAPLE) IMPLANT
RELOAD SUT TRIPLE-STITCH 2-0 (ENDOMECHANICALS) IMPLANT
RELOAD TRI 45 ART MED THCK BLK (STAPLE) ×2 IMPLANT
RELOAD TRI 45 ART MED THCK PUR (STAPLE) ×2 IMPLANT
RELOAD TRI 60 ART MED THCK BLK (STAPLE) ×2 IMPLANT
RELOAD TRI 60 ART MED THCK PUR (STAPLE) ×3 IMPLANT
SCISSORS LAP 5X45 EPIX DISP (ENDOMECHANICALS) IMPLANT
SCRUB PCMX 4 OZ (MISCELLANEOUS) ×4 IMPLANT
SEALANT SURGICAL APPL DUAL CAN (MISCELLANEOUS) IMPLANT
SET IRRIG TUBING LAPAROSCOPIC (IRRIGATION / IRRIGATOR) ×2 IMPLANT
SHEARS CURVED HARMONIC AC 45CM (MISCELLANEOUS) ×2 IMPLANT
SLEEVE ADV FIXATION 5X100MM (TROCAR) ×5 IMPLANT
SLEEVE GASTRECTOMY 36FR VISIGI (MISCELLANEOUS) ×2 IMPLANT
SOLUTION ANTI FOG 6CC (MISCELLANEOUS) ×2 IMPLANT
SPONGE LAP 18X18 X RAY DECT (DISPOSABLE) ×2 IMPLANT
STAPLER VISISTAT 35W (STAPLE) ×2 IMPLANT
SUT SURGIDAC NAB ES-9 0 48 120 (SUTURE) ×2 IMPLANT
SUT VIC AB 0 UR5 27 (SUTURE) ×1 IMPLANT
SUT VIC AB 4-0 SH 18 (SUTURE) ×2 IMPLANT
SUT VICRYL 0 TIES 12 18 (SUTURE) ×2 IMPLANT
SYR 20CC LL (SYRINGE) ×2 IMPLANT
SYR 50ML LL SCALE MARK (SYRINGE) ×2 IMPLANT
TOWEL OR 17X26 10 PK STRL BLUE (TOWEL DISPOSABLE) ×4 IMPLANT
TOWEL OR NON WOVEN STRL DISP B (DISPOSABLE) ×2 IMPLANT
TRAY FOLEY CATH 14FRSI W/METER (CATHETERS) ×2 IMPLANT
TROCAR ADV FIXATION 12X100MM (TROCAR) ×2 IMPLANT
TROCAR ADV FIXATION 5X100MM (TROCAR) ×2 IMPLANT
TROCAR BLADELESS 15MM (ENDOMECHANICALS) ×2 IMPLANT
TROCAR BLADELESS OPT 5 100 (ENDOMECHANICALS) ×2 IMPLANT
TUBE CALIBRATION LAPBAND (TUBING) IMPLANT
TUBING CONNECTING 10 (TUBING) ×2 IMPLANT
TUBING ENDO SMARTCAP (MISCELLANEOUS) ×2 IMPLANT
TUBING FILTER THERMOFLATOR (ELECTROSURGICAL) ×2 IMPLANT

## 2013-12-31 NOTE — Op Note (Signed)
Surgeon: Kaylyn Lim, MD, FACS  Asst:  Greer Pickerel, MD, FACS  Anes:  General endotracheal  Procedure: Laparoscopic sleeve gastrectomy and upper endoscopy  Diagnosis: Morbid obesity  Complications: none  EBL:   15 cc  Description of Procedure:  The patient was take to OR 1 and given general anesthesia.  The abdomen was prepped with PCMX and draped sterilely.  A timeout was performed.  Access to the abdomen was achieved with 5 mm Optiview through the left upper quadrant.  Following insufflation, the state of the abdomen was found to be free of adhesions.  The ViSiGi 36Fr tube was inserted to deflate the stomach and was pulled back into the esophagus.  A dimple was visible and so we felt that this was significant.  A posterior hiatal hernia repair was performed with a figure of 8 suture of Surgidek.  After the sleeve and with the calibration tube in place a single suture was placed anteriorally.    The pylorus was identified and we measured 5 cm back and marked the antrum.  At that point we began dissection to take down the greater curvature of the stomach using the Harmonic scalpel.  This dissection was taken all the way up to the left crus.  Posterior attachments of the stomach were also taken down.    The ViSiGi tube was then passed into the antrum and suction applied so that it was snug along the lessor curvature.  The "crow's foot" or incisura was identified.  The sleeve gastrectomy was begun using the [employer] stapler beginning with a 4.5 cm black load all with TRS.  This was followed by a 6 cm black load and the rest were purple.  When the sleeve was complete the tube was taken off suction and insufflated briefly.  The tube was withdrawn.  Upper endoscopy was then performed by Dr. Redmond Pulling and this showed a good sleeve with no bubbles or bleeding..     The specimen was extracted through the 15 trocar site.  Wounds were infiltrated with Exparel and closed with 4-0 vicryl.  The 15  mm trocar site was closed with an endoclose and 0 vicryl.    Matt B. Hassell Done, [place], [facility], [place]

## 2013-12-31 NOTE — H&P (View-Only) (Signed)
HPI Chelsea Landry is a 58 y.o. female. This patient presents for an initial weight loss surgery evaluation. She has a BMI of 36 with hypertension and prediabetes. She states that she has struggled with her weight since the birth of her child and she states that she never lost her baby weight. She has tried several diets and was enrolled in a weight loss program at [facility] program but she states that this "didn't feel right" she is not really exercising. She denies any reflux. HPI  Past Medical History  Diagnosis Date  . Hypertension   . Wears glasses   . Menopause   . Fibroids   . Vaginal lesion     Past Surgical History  Procedure Laterality Date  . 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 Cholecystectomy  2001  . Hysteroscopy      Family History  Problem Relation Age of Onset  . Leukemia Mother   . Aneurysm Father   . Cancer Brother     brain    Social History History  Substance Use Topics  . Smoking status: Never Smoker   . Smokeless tobacco: Never Used  . Alcohol Use: No    No Known Allergies  Current Outpatient Prescriptions  Medication Sig Dispense Refill  . calcium citrate-vitamin D (CITRACAL+D) 315-200 MG-UNIT per tablet Take 1 tablet by mouth daily.     Marland Kitchen losartan-hydrochlorothiazide (HYZAAR) 50-12.5 MG per tablet Take 1 tablet by mouth daily.     . Multiple Vitamin (MULTIVITAMIN) tablet Take 1 tablet by mouth daily.      No current facility-administered medications for this visit.    Review of Systems Review of Systems All other review of systems negative or noncontributory except as stated in the HPI  Blood pressure 124/78, pulse 88, resp. rate 14, height 5\' 1"  (1.549 m), weight 204 lb 6.4 oz (92.715 kg).  Physical Exam Physical Exam Physical Exam  Nursing note and vitals reviewed. Constitutional: She is oriented to person, place,  and time. She appears well-developed and well-nourished. No distress.  HENT:  Head: Normocephalic and atraumatic.  Mouth/Throat: No oropharyngeal exudate.  Eyes: Conjunctivae and EOM are normal. Pupils are equal, round, and reactive to light. Right eye exhibits no discharge. Left eye exhibits no discharge. No scleral icterus.  Neck: Normal range of motion. Neck supple. No tracheal deviation present.  Cardiovascular: Normal rate, regular rhythm, normal heart sounds and intact distal pulses.  Pulmonary/Chest: Effort normal and breath sounds normal. No stridor. No respiratory distress. She has no wheezes.  Abdominal: Soft. Bowel sounds are normal. She exhibits no distension and no mass. There is no tenderness. There is no rebound and no guarding.  Musculoskeletal: Normal range of motion. She exhibits no edema and no tenderness.  Neurological: She is alert and oriented to person, place, and time.  Skin: Skin is warm and dry. No rash noted. She is not diaphoretic. No erythema. No pallor.  Psychiatric: She has a normal mood and affect. Her behavior is normal. Judgment and thought content normal.    Data Reviewed   Assessment    Morbid obesity with a BMI of 36 and hypertension and prediabetes We had a long discussion regarding all of the weight loss surgery option including continued medical weight loss and surgical options of a lap band, the sleeve gastrectomy, and Roux-en-Y gastric bypass. We discussed the pros and cons of each procedure as well as there perioperative management. We discussed the risks. She is unsure  of which procedures that she is interested in having and I recommended that she go home and do some additional research on each of the procedures and she can only back when she decides what she would like to proceed with. I think that she be a fine candidate for any of the procedures. At least 45 minutes was spent counseling this patient.    Plan    She was approved  for this back in 2013 and then changed insurance. Her workup showed a tiny sliding hiatal hernia. She's previously had her gallbladder removed. She snores but does not have obstructive sleep apnea. Today her BMI is 37.2 with a weight of 203.6.  For sleeve gastrectomy on Dec 7th.     Matt B. Hassell Done, MD, [facility] [phone] beeper [phone]  12/19/2013 1:53 PM

## 2013-12-31 NOTE — Progress Notes (Signed)
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  Pregnancy test cancelled

## 2013-12-31 NOTE — Transfer of Care (Signed)
Immediate Anesthesia Transfer of Care Note  Patient: Chelsea Landry  Procedure(s) Performed: Procedure(s) (LRB): LAPAROSCOPIC GASTRIC SLEEVE RESECTION w/Upper GI/gen and hiatal hernia repair (N/A)  Patient Location: PACU  Anesthesia Type: General  Level of Consciousness: sedated, patient cooperative and responds to stimulation  Airway & Oxygen Therapy: Patient Spontanous Breathing and Patient connected to face mask oxgen  Post-op Assessment: Report given to PACU RN and Post -op Vital signs reviewed and stable  Post vital signs: Reviewed and stable  Complications: No apparent anesthesia complications

## 2013-12-31 NOTE — Interval H&P Note (Signed)
History and Physical Interval Note:  12/31/2013 2:58 PM  Chelsea Landry  has presented today for surgery, with the diagnosis of Morbid Obesity  The various methods of treatment have been discussed with the patient and family. After consideration of risks, benefits and other options for treatment, the patient has consented to  Procedure(s): LAPAROSCOPIC GASTRIC SLEEVE RESECTION w/Upper GI/gen (N/A) as a surgical intervention .  The patient's history has been reviewed, patient examined, no change in status, stable for surgery.  I have reviewed the patient's chart and labs.  Questions were answered to the patient's satisfaction.     [name]

## 2013-12-31 NOTE — Anesthesia Postprocedure Evaluation (Signed)
  Anesthesia Post-op Note  Patient: Chelsea Landry  Procedure(s) Performed: Procedure(s) (LRB): LAPAROSCOPIC GASTRIC SLEEVE RESECTION w/Upper GI/gen and hiatal hernia repair (N/A)  Patient Location: PACU  Anesthesia Type: General  Level of Consciousness: awake and alert   Airway and Oxygen Therapy: Patient Spontanous Breathing  Post-op Pain: mild  Post-op Assessment: Post-op Vital signs reviewed, Patient's Cardiovascular Status Stable, Respiratory Function Stable, Patent Airway and No signs of Nausea or vomiting  Last Vitals:  Filed Vitals:   12/31/13 1700  BP: 122/67  Pulse: 89  Temp: 36.4 C  Resp: 16    Post-op Vital Signs: stable   Complications: No apparent anesthesia complications

## 2014-01-01 ENCOUNTER — Inpatient Hospital Stay (HOSPITAL_COMMUNITY): Payer: 59

## 2014-01-01 ENCOUNTER — Encounter (HOSPITAL_COMMUNITY): Payer: Self-pay | Admitting: Surgery

## 2014-01-01 LAB — CBC WITH DIFFERENTIAL/PLATELET
BASOS PCT: 0 % (ref 0–1)
Basophils Absolute: 0 10*3/uL (ref 0.0–0.1)
Eosinophils Absolute: 0 10*3/uL (ref 0.0–0.7)
Eosinophils Relative: 0 % (ref 0–5)
HEMATOCRIT: 38.2 % (ref 36.0–46.0)
Hemoglobin: 12.6 g/dL (ref 12.0–15.0)
LYMPHS ABS: 0.6 10*3/uL — AB (ref 0.7–4.0)
Lymphocytes Relative: 7 % — ABNORMAL LOW (ref 12–46)
MCH: 29 pg (ref 26.0–34.0)
MCHC: 33 g/dL (ref 30.0–36.0)
MCV: 87.8 fL (ref 78.0–100.0)
MONO ABS: 0.3 10*3/uL (ref 0.1–1.0)
Monocytes Relative: 3 % (ref 3–12)
Neutro Abs: 8.1 10*3/uL — ABNORMAL HIGH (ref 1.7–7.7)
Neutrophils Relative %: 90 % — ABNORMAL HIGH (ref 43–77)
Platelets: 258 10*3/uL (ref 150–400)
RBC: 4.35 MIL/uL (ref 3.87–5.11)
RDW: 13.3 % (ref 11.5–15.5)
WBC: 9 10*3/uL (ref 4.0–10.5)

## 2014-01-01 LAB — HEMOGLOBIN AND HEMATOCRIT, BLOOD
HEMATOCRIT: 39.6 % (ref 36.0–46.0)
Hemoglobin: 12.9 g/dL (ref 12.0–15.0)

## 2014-01-01 MED ORDER — IOHEXOL 300 MG/ML  SOLN
50.0000 mL | Freq: Once | INTRAMUSCULAR | Status: AC | PRN
  Administered 2014-01-01: 10 mL via ORAL

## 2014-01-01 NOTE — Plan of Care (Signed)
Problem: Phase I Progression Outcomes Goal: CPAP/BI-PAP utilized with sleeping per order Outcome: Not Applicable Date Met:  72/53/66 Goal: Bariatric bed/trapeze per MD Outcome: Not Applicable Date Met:  44/03/47 Goal: Other Phase I Outcomes/Goals Outcome: Not Applicable Date Met:  42/59/56

## 2014-01-01 NOTE — Care Management Note (Signed)
    Page 1 of 1   01/01/2014     11:34:33 AM CARE MANAGEMENT NOTE 01/01/2014  Patient:  Chelsea Landry, Chelsea Landry   Account Number:  [id number]  Date Initiated:  01/01/2014  Documentation initiated by:  Sunday Spillers  Subjective/Objective Assessment:   58 yo female admitted s/p sleeve gastrectomy. PTA lived at home with spouse.     Action/Plan:   Home when stable   Anticipated DC Date:  01/03/2014   Anticipated DC Plan:  [place]  CM consult      Choice offered to / List presented to:             Status of service:  Completed, signed off Medicare Important Message given?   (If response is "NO", the following Medicare IM given date fields will be blank) Date Medicare IM given:   Medicare IM given by:   Date Additional Medicare IM given:   Additional Medicare IM given by:    Discharge Disposition:  HOME/SELF CARE  Per UR Regulation:  Reviewed for med. necessity/level of care/duration of stay  If discussed at [place] of Stay Meetings, dates discussed:    Comments:

## 2014-01-01 NOTE — Progress Notes (Signed)
Dr Hassell Done notified via OR nurse that patient UGI showed no leak or obstruction.

## 2014-01-01 NOTE — Plan of Care (Signed)
Problem: Phase I Progression Outcomes Goal: Operative site clean or minimal drainage Outcome: Completed/Met Date Met:  01/01/14

## 2014-01-01 NOTE — Plan of Care (Signed)
Problem: Phase I Progression Outcomes Goal: Pain controlled with appropriate interventions Outcome: Completed/Met Date Met:  01/01/14     

## 2014-01-01 NOTE — Plan of Care (Signed)
Problem: Phase II Progression Outcomes Goal: Surgical site without signs of infection Outcome: Completed/Met Date Met:  01/01/14     

## 2014-01-01 NOTE — Progress Notes (Signed)
Patient ID: Chelsea Landry, female   DOB: 1955-02-16, 58 y.o.   MRN: [id number] [facility] Progress Note:   1 Day Post-Op  Subjective: Mental status is clear.   Objective: Vital signs in last 24 hours: Temp:  [97.6 F (36.4 C)-98.6 F (37 C)] 98.2 F (36.8 C) (12/08 1324) Pulse Rate:  [54-99] 57 (12/08 1324) Resp:  [10-16] 16 (12/08 1324) BP: (97-148)/(54-81) 131/68 mmHg (12/08 1324) SpO2:  [94 %-100 %] 100 % (12/08 1324)  Intake/Output from previous day: 12/07 0701 - 12/08 0700 In: 3300 [I.V.:3300] Out: 1250 [Urine:1250] Intake/Output this shift: Total I/O In: 860 [P.O.:60; I.V.:800] Out: 300 [Urine:300]  Physical Exam: Work of breathing is normal.  Up walking   Lab Results:  Results for orders placed or performed during the hospital encounter of 12/31/13 (from the past 48 hour(s))  Hemoglobin and hematocrit, blood     Status: None   Collection Time: 12/31/13  5:43 PM  Result Value Ref Range   Hemoglobin 13.4 12.0 - 15.0 g/dL   HCT 40.2 36.0 - 46.0 %  CBC     Status: Abnormal   Collection Time: 12/31/13  7:00 PM  Result Value Ref Range   WBC 15.4 (H) 4.0 - 10.5 K/uL   RBC 4.95 3.87 - 5.11 MIL/uL   Hemoglobin 14.7 12.0 - 15.0 g/dL   HCT 44.5 36.0 - 46.0 %   MCV 89.9 78.0 - 100.0 fL   MCH 29.7 26.0 - 34.0 pg   MCHC 33.0 30.0 - 36.0 g/dL   RDW 13.6 11.5 - 15.5 %   Platelets 238 150 - 400 K/uL  Creatinine, serum     Status: Abnormal   Collection Time: 12/31/13  7:00 PM  Result Value Ref Range   Creatinine, Ser 0.85 0.50 - 1.10 mg/dL   GFR calc non Af Amer 74 (L) >90 mL/min   GFR calc Af Amer 85 (L) >90 mL/min    Comment: (NOTE) The eGFR has been calculated using the CKD EPI equation. This calculation has not been validated in all clinical situations. eGFR's persistently <90 mL/min signify possible Chronic Kidney Disease.   CBC WITH DIFFERENTIAL     Status: Abnormal   Collection Time: 01/01/14  4:50 AM  Result Value Ref Range   WBC 9.0 4.0 - 10.5  K/uL   RBC 4.35 3.87 - 5.11 MIL/uL   Hemoglobin 12.6 12.0 - 15.0 g/dL   HCT 38.2 36.0 - 46.0 %   MCV 87.8 78.0 - 100.0 fL   MCH 29.0 26.0 - 34.0 pg   MCHC 33.0 30.0 - 36.0 g/dL   RDW 13.3 11.5 - 15.5 %   Platelets 258 150 - 400 K/uL   Neutrophils Relative % 90 (H) 43 - 77 %   Neutro Abs 8.1 (H) 1.7 - 7.7 K/uL   Lymphocytes Relative 7 (L) 12 - 46 %   Lymphs Abs 0.6 (L) 0.7 - 4.0 K/uL   Monocytes Relative 3 3 - 12 %   Monocytes Absolute 0.3 0.1 - 1.0 K/uL   Eosinophils Relative 0 0 - 5 %   Eosinophils Absolute 0.0 0.0 - 0.7 K/uL   Basophils Relative 0 0 - 1 %   Basophils Absolute 0.0 0.0 - 0.1 K/uL    Radiology/Results: Dg Ugi W/water Sol Cm  01/01/2014   CLINICAL DATA:  Post bariatric surgery with gastric sleeve.  EXAM: WATER SOLUBLE UPPER GI SERIES  TECHNIQUE: Single-column upper GI series was performed using water soluble contrast.  CONTRAST:  28m OMNIPAQUE IOHEXOL 300 MG/ML  SOLN  COMPARISON:  None  FLUOROSCOPY TIME:  1 min 15 seconds  FINDINGS: Water-soluble contrast flows through the esophagus into the stomach without obstruction. Contrast flowed along the restricted stomach into the duodenum without evidence of leak or obstruction. Contrast flowed into the third portion the duodenum.  IMPRESSION: No evidence of leak or obstruction following gastric sleeve bariatric surgery.   Electronically Signed   By: SSuzy BouchardM.D.   On: 01/01/2014 13:01    Anti-infectives: Anti-infectives    Start     Dose/Rate Route Frequency Ordered Stop   12/31/13 1500  cefOXitin (MEFOXIN) 2 g in dextrose 5 % 50 mL IVPB     2 g100 mL/hr over 30 Minutes Intravenous  Once 12/31/13 1458 12/31/13 1505      Assessment/Plan: Problem List: Patient Active Problem List   Diagnosis Date Noted  . S/P laparoscopic sleeve gastrectomy Dec 2015 12/31/2013  . Morbid obesity 02/28/2013  . Lipoma of arm - both forearms - 4 cm, subcutaneous 12/28/2010  . Lipoma of back - 3 cm 12/28/2010    ugi OK. Advance  to PD 1 diet 1 Day Post-Op    LOS: 1 day   Matt B. MHassell Done MD, [facility] [phone] [phone] 01/01/2014 3:31 PM

## 2014-01-01 NOTE — Progress Notes (Signed)
Patient alert and oriented, pain is controlled. Patient is tolerating fluids, plan to advance to protein shake tomorrow.  Reviewed Gastric sleeve discharge instructions with patient and patient is able to articulate understanding.  Provided information on BELT program, Support Group and [facility] outpatient pharmacy. All questions answered, will continue to monitor.  

## 2014-01-01 NOTE — Plan of Care (Signed)
Problem: Phase I Progression Outcomes Goal: OOB as tolerated unless otherwise ordered Outcome: Completed/Met Date Met:  01/01/14     

## 2014-01-01 NOTE — Discharge Instructions (Signed)

## 2014-01-01 NOTE — Plan of Care (Signed)
Problem: Phase II Progression Outcomes Goal: Pain controlled on oral analgesia Outcome: Completed/Met Date Met:  01/01/14     

## 2014-01-01 NOTE — Plan of Care (Signed)
Problem: Phase II Progression Outcomes Goal: Upper GI SWALLOW ACCEPTABLE Outcome: Completed/Met Date Met:  01/01/14     

## 2014-01-01 NOTE — Plan of Care (Signed)
Problem: Phase I Progression Outcomes Goal: Diet - NPO Outcome: Completed/Met Date Met:  01/01/14     

## 2014-01-01 NOTE — Plan of Care (Signed)
Problem: Food- and Nutrition-Related Knowledge Deficit (NB-1.1) Goal: Nutrition education Formal process to instruct or train a patient/client in a skill or to impart knowledge to help patients/clients voluntarily manage or modify food choices and eating behavior to maintain or improve health. Outcome: Completed/Met Date Met:  01/01/14 Nutrition Education Note  Received consult for diet education per DROP protocol.   12/7 s/p Laparoscopic sleeve gastrectomy and upper endoscopy  Discussed 2 week post op diet with pt. Emphasized that liquids must be non carbonated, non caffeinated, and sugar free. Fluid goals discussed. Pt to follow up with outpatient bariatric RD for further diet progression after 2 weeks. Multivitamins and minerals also reviewed. Teach back method used, pt expressed understanding, expect good compliance.   Diet: First 2 Weeks  You will see the nutritionist about two (2) weeks after your surgery. The nutritionist will increase the types of foods you can eat if you are handling liquids well:  If you have severe vomiting or nausea and cannot handle clear liquids lasting longer than 1 day, call your surgeon  Protein Shake  Drink at least 2 ounces of shake 5-6 times per day  Each serving of protein shakes (usually 8 - 12 ounces) should have a minimum of:  15 grams of protein  And no more than 5 grams of carbohydrate  Goal for protein each day:  Men = 80 grams per day  Women = 60 grams per day  Protein powder may be added to fluids such as non-fat milk or Lactaid milk or Soy milk (limit to 35 grams added protein powder per serving)   Hydration  Slowly increase the amount of water and other clear liquids as tolerated (See Acceptable Fluids)  Slowly increase the amount of protein shake as tolerated  Sip fluids slowly and throughout the day  May use sugar substitutes in small amounts (no more than 6 - 8 packets per day; i.e. Splenda)   Fluid Goal  The first goal is to drink  at least 8 ounces of protein shake/drink per day (or as directed by the nutritionist); some examples of protein shakes are [employer], [employer], EAS Edge HP, and Unjury. See handout from pre-op Bariatric Education Class:  Slowly increase the amount of protein shake you drink as tolerated  You may find it easier to slowly sip shakes throughout the day  It is important to get your proteins in first  Your fluid goal is to drink 64 - 100 ounces of fluid daily  It may take a few weeks to build up to this  32 oz (or more) should be clear liquids  And  32 oz (or more) should be full liquids (see below for examples)  Liquids should not contain sugar, caffeine, or carbonation   Clear Liquids:  Water or Sugar-free flavored water (i.e. Fruit H2O, Propel)  Decaffeinated coffee or tea (sugar-free)  Crystal Lite, Wyler's Lite, Minute Maid Lite  Sugar-free Jell-O  Bouillon or broth  Sugar-free Popsicle: *Less than 20 calories each; Limit 1 per day   Full Liquids:  Protein Shakes/Drinks + 2 choices per day of other full liquids  Full liquids must be:  No More Than 12 grams of Carbs per serving  No More Than 3 grams of Fat per serving  Strained low-fat cream soup  Non-Fat milk  Fat-free Lactaid Milk  Sugar-free yogurt (Dannon Lite & Fit, Greek yogurt)     Chelsea Bibles, MS, RD, LDN Pager: [phone] After Hours Pager: [phone]

## 2014-01-01 NOTE — Progress Notes (Signed)
Patient alert and oriented, Post op day 1.  Provided support and encouragement.  Encouraged pulmonary toilet, ambulation and small sips of liquids.  All questions answered.  Will continue to monitor. 

## 2014-01-01 NOTE — Plan of Care (Signed)
Problem: Phase II Progression Outcomes Goal: Tolerating diet advance to Outcome: Completed/Met Date Met:  01/01/14

## 2014-01-01 NOTE — Plan of Care (Signed)
Problem: Phase I Progression Outcomes Goal: Voiding-avoid urinary catheter unless indicated Outcome: Completed/Met Date Met:  01/01/14     

## 2014-01-02 ENCOUNTER — Inpatient Hospital Stay (HOSPITAL_COMMUNITY): Payer: 59

## 2014-01-02 LAB — CBC WITH DIFFERENTIAL/PLATELET
Basophils Absolute: 0 10*3/uL (ref 0.0–0.1)
Basophils Relative: 0 % (ref 0–1)
Eosinophils Absolute: 0.1 10*3/uL (ref 0.0–0.7)
Eosinophils Relative: 1 % (ref 0–5)
HEMATOCRIT: 41.1 % (ref 36.0–46.0)
HEMOGLOBIN: 13.5 g/dL (ref 12.0–15.0)
LYMPHS ABS: 1.9 10*3/uL (ref 0.7–4.0)
Lymphocytes Relative: 17 % (ref 12–46)
MCH: 29.6 pg (ref 26.0–34.0)
MCHC: 32.8 g/dL (ref 30.0–36.0)
MCV: 90.1 fL (ref 78.0–100.0)
MONOS PCT: 6 % (ref 3–12)
Monocytes Absolute: 0.7 10*3/uL (ref 0.1–1.0)
NEUTROS ABS: 8.8 10*3/uL — AB (ref 1.7–7.7)
NEUTROS PCT: 76 % (ref 43–77)
Platelets: 237 10*3/uL (ref 150–400)
RBC: 4.56 MIL/uL (ref 3.87–5.11)
RDW: 13.7 % (ref 11.5–15.5)
WBC: 11.6 10*3/uL — ABNORMAL HIGH (ref 4.0–10.5)

## 2014-01-02 NOTE — Discharge Summary (Signed)
Physician Discharge Summary  Patient ID: Chelsea Landry MRN: [id number] DOB/AGE: December 15, 1955 58 y.o.  Admit date: 12/31/2013 Discharge date: 01/02/2014  Admission Diagnoses:  Morbid obesity  Discharge Diagnoses:  same  Active Problems:   S/P laparoscopic sleeve gastrectomy Dec 2015   Surgery:  Sleeve gastrectomy  Discharged Condition: improved  Hospital Course:   Had surgery.  PD ugi OK.  MR scheduled prior to discharge to followup from prior MRI looking at adrenal mass.   Consults: none  Significant Diagnostic Studies: MRI pending and UGI    Discharge Exam: Blood pressure 115/73, pulse 87, temperature 98.4 F (36.9 C), temperature source Oral, resp. rate 14, height 5\' 2"  (1.575 m), weight 194 lb (87.998 kg), SpO2 97 %. Incisions ok and doing well.    Disposition: 01-Home or Self Care  Discharge Instructions    Discharge instructions    Complete by:  As directed   Follow bariatric diet as prescribed     Increase activity slowly    Complete by:  As directed             Medication List    TAKE these medications        ALEVE 220 MG Caps  Generic drug:  Naproxen Sodium  Take 440 mg by mouth daily as needed (pain).     calcium citrate-vitamin D 315-200 MG-UNIT per tablet  Commonly known as:  CITRACAL+D  Take 1 tablet by mouth every evening.     doxycycline 50 MG capsule  Commonly known as:  VIBRAMYCIN  Take 2 capsules (100 mg total) by mouth 2 (two) times daily.     HYDROcodone-acetaminophen 5-325 MG per tablet  Commonly known as:  NORCO/VICODIN  Take 1 tablet by mouth every 6 (six) hours as needed.     losartan-hydrochlorothiazide 50-12.5 MG per tablet  Commonly known as:  HYZAAR  Take 1 tablet by mouth every morning.     multivitamin tablet  Take 1 tablet by mouth every morning.           Follow-up Information    Follow up with Pedro Earls, MD.   Specialty:  General Surgery   Contact information:   [address] [place]  [address] [phone]       Signed: Pedro Earls 01/02/2014, 8:29 AM

## 2014-01-02 NOTE — Plan of Care (Signed)
Problem: Phase I Progression Outcomes Goal: Initial discharge plan identified Outcome: Completed/Met Date Met:  01/02/14 Goal: Hemodynamically stable Outcome: Completed/Met Date Met:  01/02/14 Goal: Pulmonary function stable Outcome: Completed/Met Date Met:  01/02/14  Problem: Phase II Progression Outcomes Goal: Activity at appropriate level-compared to baseline (UP IN CHAIR FOR HEMODIALYSIS)  Outcome: Completed/Met Date Met:  01/02/14 Goal: Hemodynamically stable Outcome: Completed/Met Date Met:  01/02/14 Goal: Pulmonary function stable Outcome: Completed/Met Date Met:  01/02/14 Goal: CPAP/BI-PAP utilized with sleeping per order Outcome: Not Applicable Date Met:  88/35/84 Goal: Doppler acceptable Outcome: Not Applicable Date Met:  46/52/07 Goal: Discharge plan remains appropriate-arrangements made Outcome: Completed/Met Date Met:  01/02/14 Goal: Other Phase II Outcomes/Goals Outcome: Completed/Met Date Met:  01/02/14  Problem: Discharge Progression Outcomes Goal: Barriers To Progression Addressed/Resolved Outcome: Completed/Met Date Met:  01/02/14 Goal: Discharge plan in place and appropriate Outcome: Completed/Met Date Met:  01/02/14 Goal: Pain controlled with appropriate interventions Outcome: Completed/Met Date Met:  01/02/14 Goal: Hemodynamically stable Outcome: Completed/Met Date Met:  01/02/14

## 2014-01-02 NOTE — Progress Notes (Signed)
Patient alert and oriented, Post op day 2.  Provided support and encouragement.  Encouraged pulmonary toilet, ambulation and small sips of liquids.  All questions answered.  Will continue to monitor. 

## 2014-01-02 NOTE — Progress Notes (Signed)
Dr Hassell Done paged after talking to MRI.  MRI is not sure if they can scan patient today and recommend patient to come back outpatient for MRI.  Await return call. Dr Hassell Done would like patient to discharge and set up outpatient MRI.  Spoke with Marjory Lies in MRI who set appointment up for patient on 12-22 at 9 am.  Patient instructed she must be NPO for 4 hours prior to discharge.  Patient states understanding.

## 2014-01-02 NOTE — Progress Notes (Signed)
Discharge instructions given no questions per patient or husband at this time

## 2014-01-07 ENCOUNTER — Telehealth (HOSPITAL_COMMUNITY): Payer: Self-pay

## 2014-01-07 NOTE — Telephone Encounter (Signed)

## 2014-01-14 ENCOUNTER — Other Ambulatory Visit (HOSPITAL_COMMUNITY): Payer: Self-pay | Admitting: General Practice

## 2014-01-14 DIAGNOSIS — E278 Other specified disorders of adrenal gland: Secondary | ICD-10-CM

## 2014-01-15 ENCOUNTER — Encounter: Payer: 59 | Attending: Surgery

## 2014-01-15 ENCOUNTER — Ambulatory Visit (HOSPITAL_COMMUNITY)
Admit: 2014-01-15 | Discharge: 2014-01-15 | Disposition: A | Discharge status: Home / Self Care | Payer: 59 | Source: Ambulatory Visit | Attending: Surgery | Admitting: Surgery

## 2014-01-15 DIAGNOSIS — Z6835 Body mass index (BMI) 35.0-35.9, adult: Secondary | ICD-10-CM | POA: Insufficient documentation

## 2014-01-15 DIAGNOSIS — E278 Other specified disorders of adrenal gland: Secondary | ICD-10-CM

## 2014-01-15 DIAGNOSIS — N2889 Other specified disorders of kidney and ureter: Secondary | ICD-10-CM | POA: Insufficient documentation

## 2014-01-15 DIAGNOSIS — Z713 Dietary counseling and surveillance: Secondary | ICD-10-CM | POA: Insufficient documentation

## 2014-01-15 DIAGNOSIS — Z9884 Bariatric surgery status: Secondary | ICD-10-CM | POA: Insufficient documentation

## 2014-01-15 DIAGNOSIS — E279 Disorder of adrenal gland, unspecified: Secondary | ICD-10-CM | POA: Diagnosis present

## 2014-01-15 MED ORDER — GADOBENATE DIMEGLUMINE 529 MG/ML IV SOLN
20.0000 mL | Freq: Once | INTRAVENOUS | Status: AC | PRN
  Administered 2014-01-15: 19 mL via INTRAVENOUS

## 2014-01-15 NOTE — Progress Notes (Signed)
Bariatric Class:  Appt start time: 1530 end time:  1630.  2 Week Post-Operative Nutrition Class  Patient was seen on 01/15/2014 for Post-Operative Nutrition education at [facility].   Surgery date: 12/31/2013 Surgery type: Gastric sleeve Start weight at [facility]: 205.5 lbs Weight today: 183 lbs  Weight change: 16.5 lbs  TANITA  BODY COMP RESULTS  12/24/13 01/15/14   BMI (kg/m^2) 36.5 33.5   Fat Mass (lbs) 102 88.0   Fat Free Mass (lbs) 97.5 95.0   Total Body Water (lbs) 71.5 69.5    The following the learning objectives were met by the patient during this course:  Identifies Phase 3A (Soft, High Proteins) Dietary Goals and will begin from 2 weeks post-operatively to 2 months post-operatively  Identifies appropriate sources of fluids and proteins   States protein recommendations and appropriate sources post-operatively  Identifies the need for appropriate texture modifications, mastication, and bite sizes when consuming solids  Identifies appropriate multivitamin and calcium sources post-operatively  Describes the need for physical activity post-operatively and will follow MD recommendations  States when to call healthcare provider regarding medication questions or post-operative complications  Handouts given during class include:  Phase 3A: Soft, High Protein Diet Handout  Follow-Up Plan: Patient will follow-up at [facility] in 6 weeks for 2 month post-op nutrition visit for diet advancement per MD.

## 2014-02-25 ENCOUNTER — Encounter: Payer: 59 | Attending: Surgery | Admitting: Dietician

## 2014-02-25 DIAGNOSIS — Z6835 Body mass index (BMI) 35.0-35.9, adult: Secondary | ICD-10-CM | POA: Insufficient documentation

## 2014-02-25 DIAGNOSIS — Z713 Dietary counseling and surveillance: Secondary | ICD-10-CM | POA: Insufficient documentation

## 2014-02-25 NOTE — Patient Instructions (Addendum)
Goals:  Follow Phase 3B: High Protein + Non-Starchy Vegetables  Eat 3-6 small meals/snacks, every 3-5 hrs  Increase lean protein foods to meet 60g goal  Increase fluid intake to 64oz +  Avoid drinking 15 minutes before, during and 30 minutes after eating  Aim for >30 min of physical activity daily  Limit carbs to 15 grams per meal    TANITA  BODY COMP RESULTS  12/24/13 01/15/14 02/25/14   BMI (kg/m^2) 36.5 33.5 31.2   Fat Mass (lbs) 102 88.0 72.5   Fat Free Mass (lbs) 97.5 95.0 98   Total Body Water (lbs) 71.5 69.5 71.5

## 2014-02-25 NOTE — Progress Notes (Signed)
  Follow-up visit:  8 Weeks Post-Operative Gastric sleeve Surgery  Medical Nutrition Therapy:  Appt start time: 420 end time:  450  Primary concerns today: Post-operative Bariatric Surgery Nutrition Management.  Works on a [employer] and cannot keep water or snacks on hand. Not getting enough to drink. Attempted to contact nurse at work to discuss ways she can get more to [place]. Stomach growls frequently. A1c to be rechecked in April.   Surgery date: 12/31/2013 Surgery type: Gastric sleeve Start weight at [facility]: 205.5 lbs Weight today: 170.5 lbs Weight change: 12.5 lbs Total weight lost: 35 lbs   TANITA  BODY COMP RESULTS  12/24/13 01/15/14 02/25/14   BMI (kg/m^2) 36.5 33.5 31.2   Fat Mass (lbs) 102 88.0 72.5   Fat Free Mass (lbs) 97.5 95.0 98   Total Body Water (lbs) 71.5 69.5 71.5    Preferred Learning Style:  No preference indicated   Learning Readiness:  Ready  24-hr recall: B (6:30-6:50 AM): 3/4 premier protein shake (20g) Snk (10 AM): 1/2 sausage patty and part of an egg (~12g) L (PM): unable to take a break from work Snk (2:45 PM): rest of sausage patty and egg (~12g)  D (6PM): 1.5 oz of chicken (12g) Snk (PM): usually none, sometimes a popsicle  Fluid intake: "not enough;" 1.5 bottles of water per day Estimated total protein intake: 56 g per day  Medications: see list Supplementation: taking (needs to get a Calcium citrate)  Using straws: yes, no gas pain Drinking while eating: some sips Hair loss: no Carbonated beverages: no N/V/D/C: constipation, vomiting early post op Dumping syndrome: no  Recent physical activity:  Just started BELT program and treadmill at home   Progress Towards Goal(s):  In progress.  Handouts given during visit include:  Phase 3B lean protein + non starchy vegetables   Nutritional Diagnosis:  [place]-3.3 Overweight/obesity related to past poor dietary habits and physical inactivity as evidenced by patient w/ recent gastric  sleeve surgery following dietary guidelines for continued weight loss.     Intervention:  Nutrition counseling provided.  Samples provided and patient instructed on proper use: Bariatric Advantage Calcium citrate chews (orange - qty 2) Lot#: [id number] Exp: 05/2014  Bariatric Advantage Calcium citrate chews (caramel - qty 2) Lot#: [id number] Exp: 05/2014  Teaching Method Utilized:  Visual Auditory Hands on  Barriers to learning/adherence to lifestyle change: unable to drink water at work  Demonstrated degree of understanding via:  Teach Back   Monitoring/Evaluation:  Dietary intake, exercise, and body weight. Follow up in 1.5 months for 3.5 month post-op visit.

## 2014-03-22 ENCOUNTER — Ambulatory Visit (INDEPENDENT_AMBULATORY_CARE_PROVIDER_SITE_OTHER): Payer: 59 | Admitting: Podiatrist

## 2014-03-22 DIAGNOSIS — L600 Ingrowing nail: Secondary | ICD-10-CM

## 2014-03-22 MED ORDER — EFINACONAZOLE 10 % EX SOLN: 1.0000 [drp] | Freq: Every day | CUTANEOUS | Status: DC

## 2014-03-22 NOTE — Patient Instructions (Signed)
Soak Instructions    THE DAY AFTER THE PROCEDURE  Place 1/4 cup of epsom salts in a quart of warm tap water.  soak in the solution for 20 minutes.  This soak should be done twice a day.  Next, remove your foot or feet from solution, blot dry the affected area and cover.  You may use a band aid large enough to cover the area or use gauze and tape.  Apply other medications to the area as directed by the doctor such as polysporin neosporin.  IF YOUR SKIN BECOMES IRRITATED WHILE USING THESE INSTRUCTIONS, IT IS OKAY TO SWITCH TO  WHITE VINEGAR AND WATER. Or you may use antibacterial soap and water to keep the toe clean

## 2014-03-22 NOTE — Progress Notes (Signed)
Chief Complaint  Patient presents with  . Ingrown Toenail    ''LT FOOT GREAT TOENAIL IS SORE.''     HPI: Patient is 59 y.o. female who presents today for pain in the left great toe. She states it hurts due to the sheets hitting the toe and hurts in her shoes. She's tried taking pressure off of the toenail and it still is uncomfortable. In the past we performed an ingrown toenail procedure which worked for a while now the pain has returned in the central aspect of the toenail.   No Known Allergies  Physical Exam  Patient is awake, alert, and oriented x 3.  In no acute distress.  Vascular status is intact with palpable pedal pulses at 2/4 DP and PT bilateral and capillary refill time within normal limits. Neurological sensation is also intact bilaterally via Semmes Weinstein monofilament at 5/5 sites. Light touch, vibratory sensation, Achilles tendon reflex is intact. Dermatological exam reveals skin color, turger and texture as normal. No open lesions present.  Musculature intact with dorsiflexion, plantarflexion, inversion, eversion. Thickness and discoloration on the left great toenail is noted along the entirety of the toenail itself. Multiple signs of mycotic infection are present. No redness, no swelling, no sign of infection is present.  Assessment: Thickness and dystrophy of the left hallux nail, onychomycosis  Plan: Discussed removing the nail temporarily so that a new nail can grow back, discussed treating the nail with a topical antifungal and she would like to do this. I smoothed that down down to a thinner level and wrote a prescription for Jublia for her to obtain from [facility]. She'll use this and will call if she notices no improvement or like to consider having the nail removed.

## 2014-03-22 NOTE — Progress Notes (Deleted)
   Subjective:    Patient ID: Chelsea Landry, female    DOB: 30-Oct-1955, 59 y.o.   MRN: [id number]  HPI  PT STATED LT FOOT GREAT TOENAIL IS BEEN SORE FOR 5 MONTHS. THE TOENAIL IS GETTING WORSE ESPECIAL WHEN WEARING SHOES OR PRESSURE ON IT. TRIED NO TREATMENT.  Review of Systems  All other systems reviewed and are negative.      Objective:   Physical Exam        Assessment & Plan:

## 2014-03-25 ENCOUNTER — Telehealth: Payer: Self-pay | Admitting: *Deleted

## 2014-03-25 NOTE — Telephone Encounter (Signed)
Pt asked if she could use the Jublia on B/L 1st toenails although prescribed for left 1st toenail, and if she could polish the toenails.  I informed pt she could use on B/L big toenails, but only polish for an event and remove afterwards.  Pt agreed.

## 2014-04-08 ENCOUNTER — Encounter: Payer: 59 | Attending: Surgery | Admitting: Dietician

## 2014-04-08 DIAGNOSIS — Z713 Dietary counseling and surveillance: Secondary | ICD-10-CM | POA: Insufficient documentation

## 2014-04-08 DIAGNOSIS — Z6835 Body mass index (BMI) 35.0-35.9, adult: Secondary | ICD-10-CM | POA: Insufficient documentation

## 2014-04-08 NOTE — Patient Instructions (Addendum)
Goals:  Eat 3-6 small meals/snacks, every 3-5 hrs  Increase lean protein foods to meet 60g goal  Increase fluid intake to 64oz +  Avoid drinking 15 minutes before, during and 30 minutes after eating  Aim for >30 min of physical activity daily  Limit carbs to 15 grams per meal  Try diet cranberry juice  Start taking vitamin B12 1x a week  Put the scale in the closet (take 1 or 2 days off a week)   TANITA  BODY COMP RESULTS  12/24/13 01/15/14 02/25/14 04/08/14   BMI (kg/m^2) 36.5 33.5 31.2 28.7   Fat Mass (lbs) 102 88.0 72.5 63   Fat Free Mass (lbs) 97.5 95.0 98 94   Total Body Water (lbs) 71.5 69.5 71.5 69

## 2014-04-08 NOTE — Progress Notes (Signed)
  Follow-up visit:  3 months Post-Operative Gastric sleeve Surgery  Medical Nutrition Therapy:  Appt start time: 578 end time:  1000  Primary concerns today: Post-operative Bariatric Surgery Nutrition Management.  Chelsea Landry returns today having lost another 13.5 lbs. She reports she no longer prefers protein shakes. She is still having hunger pains throughout the day, even after she eats. She states that water sometimes helps relieve hunger.  Surgery date: 12/31/2013 Surgery type: Gastric sleeve Start weight at [facility]: 205.5 lbs Weight today: 157 lbs Weight change: 13.5 lbs Total weight lost: 48.5 lbs Goal weight: 140-150 lbs   TANITA  BODY COMP RESULTS  12/24/13 01/15/14 02/25/14 04/08/14   BMI (kg/m^2) 36.5 33.5 31.2 28.7   Fat Mass (lbs) 102 88.0 72.5 63   Fat Free Mass (lbs) 97.5 95.0 98 94   Total Body Water (lbs) 71.5 69.5 71.5 69    Preferred Learning Style:  No preference indicated   Learning Readiness:  Ready  24-hr recall: B (6:30-6:50 AM): 1-2 sausage links or an egg (7-14g) Snk (AM): cottage cheese with peaches or sausage or yogurt (7-14g) L (12 PM): leftover meat and vegetables (14g) Snk (2:45 PM): rest of sausage patty and egg (~12g)  D (6PM): 5 small shrimp and quinoa Snk (PM): usually none, sometimes a popsicle  Fluid intake:still not enough (having trouble drinking at work); about 32 ounces per day per patient estimate Estimated total protein intake: 56 g per day  Medications: see list Supplementation: taking (patient wishes to get B12 shot but insurance will not approve)   Using straws: yes, no gas pain Drinking while eating: some sips Hair loss: no Carbonated beverages: no N/V/D/C: constipation, takes Milk of Magnesia; vomiting occasionally after eating too fast Dumping syndrome: no  Recent physical activity: BELT program and treadmill at home   Progress Towards Goal(s):  In progress.   Nutritional Diagnosis:  [place]-3.3 Overweight/obesity related to  past poor dietary habits and physical inactivity as evidenced by patient w/ recent gastric sleeve surgery following dietary guidelines for continued weight loss.     Intervention:  Nutrition counseling provided.  Teaching Method Utilized:  Visual Auditory Hands on  Barriers to learning/adherence to lifestyle change: unable to drink water at work  Demonstrated degree of understanding via:  Teach Back   Monitoring/Evaluation:  Dietary intake, exercise, and body weight. Follow up in 1.5 months for 5 month post-op visit.

## 2014-05-20 ENCOUNTER — Encounter: Payer: 59 | Attending: Surgery | Admitting: Dietician

## 2014-05-20 DIAGNOSIS — Z6835 Body mass index (BMI) 35.0-35.9, adult: Secondary | ICD-10-CM | POA: Diagnosis not present

## 2014-05-20 DIAGNOSIS — Z713 Dietary counseling and surveillance: Secondary | ICD-10-CM | POA: Diagnosis not present

## 2014-05-20 NOTE — Progress Notes (Signed)
  Follow-up visit:  4.5 months Post-Operative Gastric sleeve Surgery  Medical Nutrition Therapy:  Appt start time: 672 end time:  094  Primary concerns today: Post-operative Bariatric Surgery Nutrition Management.  Chelsea Landry returns today having lost another 7 pounds. Her triglycerides were high recently. She has added some fruit and cottage cheese with fruit. She chooses lean meats. Final BELT appointment today; she reports she has been logging exercise.  Surgery date: 12/31/2013 Surgery type: Gastric sleeve Start weight at [facility]: 205.5 lbs Weight today: 150.5 lbs Weight change: 6.5 lbs Total weight lost: 55 lbs Goal weight: 140-150 lbs   TANITA  BODY COMP RESULTS  12/24/13 01/15/14 02/25/14 04/08/14 05/20/14   BMI (kg/m^2) 36.5 33.5 31.2 28.7 27.5   Fat Mass (lbs) 102 88.0 72.5 63 57.5   Fat Free Mass (lbs) 97.5 95.0 98 94 93   Total Body Water (lbs) 71.5 69.5 71.5 69 68    Preferred Learning Style:  No preference indicated   Learning Readiness:  Ready  24-hr recall: B (6:30-6:50 AM): yogurt or cottage cheese with fruit (12g) Snk (AM): 2 [place] sausage patties (6g) L (12 PM): leftover meat ([place], chicken, beef, or fish) and vegetables (14-21g) Snk: 3 strawberries and blueberries OR popcorn OR almonds D (6PM): see lunch (14-21g) Snk (PM): usually none, sometimes a popsicle  Fluid intake: 32 oz total of diet cranberry juice and water Estimated total protein intake: 56 g per day  Medications: see list Supplementation: taking   Using straws: yes, no gas pain Drinking while eating: some sips Hair loss: yes Carbonated beverages: no N/V/D/C: constipation resolved; vomiting when she eats and talks Dumping syndrome: no  Recent physical activity: final BELT appointment today; joined [employer]; plans to join another gym eventually  Progress Towards Goal(s):  In progress.  Nutritional Diagnosis:  [place]-3.3 Overweight/obesity related to past poor dietary habits and  physical inactivity as evidenced by patient w/ recent gastric sleeve surgery following dietary guidelines for continued weight loss.   Intervention:  Nutrition counseling provided.  Samples provided and patient instructed on proper use:  Unjury protein powder (unflavored - qty 1) Lot#: [id number] Exp: 02/2015  Unjury protein powder (strawberry - qty 1) Lot#: [id number] Exp: 02/2015  Unjury protein powder (vanilla - qty 1) Lot#: [id number] Exp: 11/2014  Teaching Method Utilized:  Visual Auditory Hands on  Barriers to learning/adherence to lifestyle change: unable to drink water at work  Demonstrated degree of understanding via:  Teach Back   Monitoring/Evaluation:  Dietary intake, exercise, and body weight. Follow up in 1.5 months for 6 month post-op visit.

## 2014-05-20 NOTE — Patient Instructions (Addendum)
Goals:  Eat 3-6 small meals/snacks, every 3-5 hrs  Increase lean protein foods to meet 60g goal  Increase fluid intake to 64oz +  Avoid drinking 15 minutes before, during and 30 minutes after eating  Aim for >30 min of physical activity daily  Limit carbs to 15 grams per meal  Put the scale in the closet (take 1 or 2 days off a week)  Consider sipping a protein shake or adding powder to food if you feel like you aren't getting enough protein   TANITA  BODY COMP RESULTS  12/24/13 01/15/14 02/25/14 04/08/14 05/20/14   BMI (kg/m^2) 36.5 33.5 31.2 28.7 27.5   Fat Mass (lbs) 102 88.0 72.5 63 57.5   Fat Free Mass (lbs) 97.5 95.0 98 94 93   Total Body Water (lbs) 71.5 69.5 71.5 69 68

## 2014-07-03 ENCOUNTER — Encounter: Payer: 59 | Attending: Surgery | Admitting: Dietician

## 2014-07-03 ENCOUNTER — Encounter: Payer: Self-pay | Admitting: Dietician

## 2014-07-03 DIAGNOSIS — Z713 Dietary counseling and surveillance: Secondary | ICD-10-CM | POA: Insufficient documentation

## 2014-07-03 DIAGNOSIS — Z6835 Body mass index (BMI) 35.0-35.9, adult: Secondary | ICD-10-CM | POA: Insufficient documentation

## 2014-07-03 NOTE — Progress Notes (Signed)
  Follow-up visit:  6 months Post-Operative Gastric sleeve Surgery  Medical Nutrition Therapy:  Appt start time: 415 end time:  445  Primary concerns today: Post-operative Bariatric Surgery Nutrition Management.  Chelsea Landry returns today having lost another 3 pounds. She feels like she has hit her weight loss goal and is feeling good. She had her blood work retaken in May and her most recent lipid panel is within normal limits. She states she pretty much eats "anything my body can tolerate" but in moderation. Still struggling with eating too fast.   Surgery date: 12/31/2013 Surgery type: Gastric sleeve Start weight at [facility]: 205.5 lbs Weight today: 147.5 lbs  Weight change: 3 lbs Total weight lost: 58 lbs Goal weight: 140-150 lbs   TANITA  BODY COMP RESULTS  12/24/13 01/15/14 02/25/14 04/08/14 05/20/14 07/03/14   BMI (kg/m^2) 36.5 33.5 31.2 28.7 27.5 27   Fat Mass (lbs) 102 88.0 72.5 63 57.5 51   Fat Free Mass (lbs) 97.5 95.0 98 94 93 96.5   Total Body Water (lbs) 71.5 69.5 71.5 69 68 70.5    Preferred Learning Style:  No preference indicated   Learning Readiness:  Ready  24-hr recall: B (6:30-6:50 AM): sausage patty on a roll (3g) Snk (AM): cheese stick or almonds, sometimes goldfish (0-6g) Snk (AM): 1/2 Premier protein bar or Unjury and vanilla yogurt or cottage cheese with peaches (15-20g) L (12 PM): 1/2 hot dog or 1/2 hamburger OR 2 oz chicken leg or salad with ham and [place] (7-14g) Snk: 3 strawberries and blueberries OR goldfish OR almonds D (6PM): see lunch (7-14g) Snk (PM): popcorn  Fluid intake: 32 oz total of diet cranberry juice and water Estimated total protein intake: 56 g per day  Medications: see list Supplementation: taking   Using straws: yes, no gas pain Drinking while eating: some sips Hair loss: yes Carbonated beverages: no N/V/D/C: constipation resolved; vomiting when she eats and talks Dumping syndrome: no  Recent physical activity: joined [employer], core and lower/upper body weight machines (30 minutes 3x a week), treadmill at home 3x a week   Progress Towards Goal(s):  In progress.  Nutritional Diagnosis:  [place]-3.3 Overweight/obesity related to past poor dietary habits and physical inactivity as evidenced by patient w/ recent gastric sleeve surgery following dietary guidelines for continued weight loss.   Intervention:  Nutrition counseling provided.  Teaching Method Utilized:  Visual Auditory Hands on  Barriers to learning/adherence to lifestyle change: unable to drink water at work  Demonstrated degree of understanding via:  Teach Back   Monitoring/Evaluation:  Dietary intake, exercise, and body weight. Follow up in 1.5 months for 7.5 month post-op visit.

## 2014-07-03 NOTE — Patient Instructions (Addendum)
Goals:  Increase fluid intake to 64oz +  Avoid drinking 15 minutes before, during and 30 minutes after eating  Put the scale in the closet (take 1 or 2 days off a week)  Keep adding protein powder to yogurt  Have a protein food with each meal and snack (especially with popcorn at night)  Continue to focus on meat and vegetables as the basis of your diet and enjoy "play" foods in moderation    TANITA  BODY COMP RESULTS  12/24/13 01/15/14 02/25/14 04/08/14 05/20/14 07/03/14   BMI (kg/m^2) 36.5 33.5 31.2 28.7 27.5 27   Fat Mass (lbs) 102 88.0 72.5 63 57.5 51   Fat Free Mass (lbs) 97.5 95.0 98 94 93 96.5   Total Body Water (lbs) 71.5 69.5 71.5 69 68 70.5

## 2014-08-14 ENCOUNTER — Ambulatory Visit: Payer: 59 | Admitting: Dietician

## 2014-08-20 ENCOUNTER — Encounter: Payer: Self-pay | Admitting: Dietician

## 2014-08-20 ENCOUNTER — Encounter: Payer: 59 | Attending: Surgery | Admitting: Dietician

## 2014-08-20 DIAGNOSIS — Z713 Dietary counseling and surveillance: Secondary | ICD-10-CM | POA: Insufficient documentation

## 2014-08-20 DIAGNOSIS — Z6835 Body mass index (BMI) 35.0-35.9, adult: Secondary | ICD-10-CM | POA: Diagnosis not present

## 2014-08-20 NOTE — Patient Instructions (Addendum)
Goals:  Increase fluid intake to 64oz +  Avoid drinking 15 minutes before, during and 30 minutes after eating  Keep adding protein powder to yogurt  Have a protein food with each meal and snack (especially with popcorn at night)  Continue to focus on meat and vegetables as the basis of your diet and enjoy "play" foods in moderation   Get back on track after vacation  [employer] routine, go to grocery store with a list, prepare meals and snacks ahead of time   TANITA  BODY COMP RESULTS  12/24/13 01/15/14 02/25/14 04/08/14 05/20/14 07/03/14 08/20/14   BMI (kg/m^2) 36.5 33.5 31.2 28.7 27.5 27 27.3   Fat Mass (lbs) 102 88.0 72.5 63 57.5 51 51   Fat Free Mass (lbs) 97.5 95.0 98 94 93 96.5 98.5   Total Body Water (lbs) 71.5 69.5 71.5 69 68 70.5 72

## 2014-08-20 NOTE — Progress Notes (Signed)
  Follow-up visit:  7 months Post-Operative Gastric sleeve Surgery  Medical Nutrition Therapy:  Appt start time: 440 end time:  500  Primary concerns today: Post-operative Bariatric Surgery Nutrition Management.  Chelsea Landry returns today having maintained her fat mass. She reports she has had a lot of celebrations and vacation in the last month and has not eaten as well. Not weighing every day. Still feels like she's not meeting protein or fluid needs. Bought some Unjury protein powder and mixes that with yogurt.   Surgery date: 12/31/2013 Surgery type: Gastric sleeve Start weight at [facility]: 205.5 lbs Weight today: 149.5 lbs Weight change: 2 lbs gain Total weight lost: 56 lbs Goal weight: 140-150 lbs   TANITA  BODY COMP RESULTS  12/24/13 01/15/14 02/25/14 04/08/14 05/20/14 07/03/14 08/20/14   BMI (kg/m^2) 36.5 33.5 31.2 28.7 27.5 27 27.3   Fat Mass (lbs) 102 88.0 72.5 63 57.5 51 51   Fat Free Mass (lbs) 97.5 95.0 98 94 93 96.5 98.5   Total Body Water (lbs) 71.5 69.5 71.5 69 68 70.5 72    Preferred Learning Style:  No preference indicated   Learning Readiness:  Ready  24-hr recall: B (6:30-6:50 AM): sausage patty on a roll (3g) Snk (AM): cheese stick or almonds, sometimes goldfish (0-6g) Snk (AM): 1/2 Premier protein bar or Unjury and vanilla yogurt or cottage cheese with peaches (15-20g) L (12 PM): 1/2 hot dog or 1/2 hamburger OR 2 oz chicken leg or salad with ham and [place] (7-14g) Snk: 3 strawberries and blueberries OR goldfish OR almonds D (6PM): see lunch (7-14g) Snk (PM): popcorn  Fluid intake: 32 oz total of diet cranberry juice and water Estimated total protein intake: 56 g per day  Medications: see list Supplementation: taking   Using straws: yes, no gas pain Drinking while eating: some sips Hair loss: yes Carbonated beverages: no N/V/D/C: constipation resolved; vomiting when she eats and talks Dumping syndrome: no  Recent physical activity: joined [employer], core and lower/upper body weight machines (30 minutes 3x a week), treadmill at home 3x a week   Progress Towards Goal(s):  In progress.  Nutritional Diagnosis:  [place]-3.3 Overweight/obesity related to past poor dietary habits and physical inactivity as evidenced by patient w/ recent gastric sleeve surgery following dietary guidelines for continued weight loss.   Intervention:  Nutrition counseling provided.  Teaching Method Utilized:  Visual Auditory Hands on  Barriers to learning/adherence to lifestyle change: unable to drink water at work  Demonstrated degree of understanding via:  Teach Back   Monitoring/Evaluation:  Dietary intake, exercise, and body weight. Follow up in 1.5 months for 9 month post-op visit.

## 2014-09-12 ENCOUNTER — Ambulatory Visit
Admission: RE | Admit: 2014-09-12 | Discharge: 2014-09-12 | Disposition: A | Discharge status: Home / Self Care | Payer: 59 | Source: Ambulatory Visit | Attending: Family Medicine | Admitting: Family Medicine

## 2014-09-12 ENCOUNTER — Other Ambulatory Visit: Payer: Self-pay | Admitting: Family Medicine

## 2014-09-12 DIAGNOSIS — M25552 Pain in left hip: Secondary | ICD-10-CM

## 2014-09-12 DIAGNOSIS — M542 Cervicalgia: Secondary | ICD-10-CM

## 2014-10-02 ENCOUNTER — Encounter: Payer: Self-pay | Admitting: Dietician

## 2014-10-02 ENCOUNTER — Encounter: Payer: 59 | Attending: Surgery | Admitting: Dietician

## 2014-10-02 DIAGNOSIS — Z6835 Body mass index (BMI) 35.0-35.9, adult: Secondary | ICD-10-CM | POA: Insufficient documentation

## 2014-10-02 DIAGNOSIS — Z713 Dietary counseling and surveillance: Secondary | ICD-10-CM | POA: Insufficient documentation

## 2014-10-02 NOTE — Progress Notes (Signed)
  Follow-up visit:  9 months Post-Operative Gastric sleeve Surgery  Medical Nutrition Therapy:  Appt start time: 405 end time:  326  Primary concerns today: Post-operative Bariatric Surgery Nutrition Management.  Kadesha returns today having gained 1/2 a pound. Would like to be back down to 145 lbs. Has continued to have a lot of celebrations over the summer. Having Graham crackers or [employer] for dessert. Has also been eating a lot of fruit. Planning to get a cortisone shot in her hip for bursitis.    Surgery date: 12/31/2013 Surgery type: Gastric sleeve Start weight at [facility]: 205.5 lbs Weight today: 150 lbs Weight change: 0.5 lbs gain Total weight lost: 56 lbs Goal weight: 140-150 lbs   TANITA  BODY COMP RESULTS  12/24/13 01/15/14 02/25/14 04/08/14 05/20/14 07/03/14 08/20/14 10/02/14   BMI (kg/m^2) 36.5 33.5 31.2 28.7 27.5 27 27.3 27.4   Fat Mass (lbs) 102 88.0 72.5 63 57.5 51 51 55   Fat Free Mass (lbs) 97.5 95.0 98 94 93 96.5 98.5 95   Total Body Water (lbs) 71.5 69.5 71.5 69 68 70.5 72 69.5    Preferred Learning Style:  No preference indicated   Learning Readiness:  Ready  24-hr recall: B (6:30-6:50 AM): 3 links of [place] sausage and cheese (16g) Snk (AM): cheese stick or almonds (6g) Snk (AM): 1/2 Premier protein bar or Unjury and vanilla yogurt or cottage cheese with peaches (15-20g) L (12 PM): 1/2 hot dog or 1/2 hamburger OR 2 oz chicken leg or salad with ham and [place] (7-14g) Snk: 3 strawberries and blueberries OR goldfish OR almonds D (6PM): hotdog, sometimes with a bun (7-14g) Snk (PM): popcorn, graham crackers or shortbread cookies  Fluid intake: diet cranberry juice and water (close to 64 oz) Estimated total protein intake: 56 g per day  Medications: see list Supplementation: taking   Using straws: yes, no gas pain Drinking while eating: some sips Hair loss: yes Carbonated beverages: no N/V/D/C: constipation resolved; vomiting when she eats and  talks Dumping syndrome: no  Recent physical activity: joined [employer], core and lower/upper body weight machines (30 minutes 3x a week), treadmill at home 3x a week   Progress Towards Goal(s):  In progress.  Nutritional Diagnosis:  [place]-3.3 Overweight/obesity related to past poor dietary habits and physical inactivity as evidenced by patient w/ recent gastric sleeve surgery following dietary guidelines for continued weight loss.   Intervention:  Nutrition counseling provided.  Teaching Method Utilized:  Visual Auditory Hands on  Barriers to learning/adherence to lifestyle change: unable to drink water at work  Demonstrated degree of understanding via:  Teach Back   Monitoring/Evaluation:  Dietary intake, exercise, and body weight. Follow up in 1.5 months for 10 month post-op visit.

## 2014-10-02 NOTE — Patient Instructions (Addendum)
Goals:  Increase fluid intake to 64oz +  Avoid drinking 15 minutes before, during and 30 minutes after eating  Have a protein food with each meal and snack (especially with popcorn at night)  Continue to focus on meat and vegetables as the basis of your diet and enjoy "play" foods in moderation (remove trigger foods from the house)  Get back on track after vacation  Redevelop routine, go to grocery store with a list, prepare meals and snacks ahead of time  Limit tropical fruits like mango or pineapple  Work on leaving the last few bites of food on your plate if you are full

## 2014-10-08 ENCOUNTER — Other Ambulatory Visit (HOSPITAL_COMMUNITY): Payer: Self-pay | Admitting: Family Medicine

## 2014-10-08 DIAGNOSIS — Z1231 Encounter for screening mammogram for malignant neoplasm of breast: Secondary | ICD-10-CM

## 2014-10-10 ENCOUNTER — Ambulatory Visit (HOSPITAL_COMMUNITY)
Admission: RE | Admit: 2014-10-10 | Discharge: 2014-10-10 | Disposition: A | Discharge status: Home / Self Care | Payer: 59 | Source: Ambulatory Visit | Attending: Family Medicine | Admitting: Family Medicine

## 2014-10-10 DIAGNOSIS — Z1231 Encounter for screening mammogram for malignant neoplasm of breast: Secondary | ICD-10-CM | POA: Insufficient documentation

## 2014-11-20 ENCOUNTER — Encounter: Payer: Self-pay | Admitting: Dietician

## 2014-11-20 ENCOUNTER — Encounter: Payer: 59 | Attending: Surgery | Admitting: Dietician

## 2014-11-20 DIAGNOSIS — Z6835 Body mass index (BMI) 35.0-35.9, adult: Secondary | ICD-10-CM | POA: Insufficient documentation

## 2014-11-20 DIAGNOSIS — Z713 Dietary counseling and surveillance: Secondary | ICD-10-CM | POA: Diagnosis not present

## 2014-11-20 NOTE — Patient Instructions (Addendum)
Goals:  Increase fluid intake to 64oz +  Avoid drinking 15 minutes before, during and 30 minutes after eating  Have a protein food with each meal and snack (especially with popcorn at night)  Continue to focus on meat and vegetables as the basis of your diet and enjoy "play" foods in moderation (remove trigger foods from the house)  Get back on track after vacation  [employer] routine, go to grocery store with a list, prepare meals and snacks ahead of time  Limit tropical fruits like mango or pineapple  Work on leaving the last few bites of food on your plate if you are full  Continue to use small plates and containers  Continue to prepare ahead of time  Meat first, then vegetables, then carbs  Go back to chewable vitamins when you finish the gummies  Get back into exercise routine; try be active on vacation   POTASSIUM: For a reference point, a banana contains 422 mg potassium.  Fruit:  ?Avocado, 1/2 avocado has 487 mg ?Cantaloupe, 1 cup has 473 mg ?Honeydew, 1 cup has 404 mg ?Cherries, 1 cup has 306 mg   Vegetables    ?Zucchini,  cup cooked has 280 mg potassium ?Tomato, 1 medium has 292 mg ?Potato, 1/2 cup has 316 mg ?Sweet potato, 1/2 cup has 224 mg  Legumes    ?Soybeans,  cup cooked has 440 mg potassium ?Lentils,  cup cooked has 370 mg potassium ?Kidney beans,  cup cooked has 360 mg potassium ?White beans, canned, drained, half cup: 595 mg  Protein   ?Yogurt, fat-free, 1 cup: 579 mg ?Halibut, 3 ounces, cooked: 490 mg ?Pork tenderloin, 3 ounces, cooked: 382 mg ?Milk, skim or soy, 8 ounces: 340-410 mg ?Salmon, [place], 3 ounces, cooked: 326 mg ?Chicken breast, 3 ounces, cooked: 218 mg ?Tuna, light, canned, drained, 3 ounces: 201 mg

## 2014-11-20 NOTE — Progress Notes (Signed)
  Follow-up visit:  10.5 months Post-Operative Gastric sleeve Surgery  Medical Nutrition Therapy:  Appt start time: 805 end time:  840  Primary concerns today: Post-operative Bariatric Surgery Nutrition Management.  Chelsea Landry returns today having maintained her weight. She is nervous she will gain weight during the holidays. Still eating protein foods several times a day. Still struggling to meet fluid needs. Having leg cramps at night and plans to discuss this with her doctor.    Surgery date: 12/31/2013 Surgery type: Gastric sleeve Start weight at [facility]: 205.5 lbs Weight today: 149.5 lbs Weight change: 0.5 lbs Total weight lost: 56 lbs Goal weight: 140-150 lbs   TANITA  BODY COMP RESULTS  12/24/13 01/15/14 02/25/14 04/08/14 05/20/14 07/03/14 08/20/14 10/02/14 11/20/14   BMI (kg/m^2) 36.5 33.5 31.2 28.7 27.5 27 27.3 27.4 27.3   Fat Mass (lbs) 102 88.0 72.5 63 57.5 51 51 55 56.5   Fat Free Mass (lbs) 97.5 95.0 98 94 93 96.5 98.5 95 93   Total Body Water (lbs) 71.5 69.5 71.5 69 68 70.5 72 69.5 68    Preferred Learning Style:  No preference indicated   Learning Readiness:  Ready  24-hr recall: B (6:30-6:50 AM): 3 links of [place] sausage and cheese (16g) Snk (AM): cheese stick or almonds (6g) Snk (AM): 1/2 Premier protein bar or Unjury and vanilla yogurt or cottage cheese with peaches (15-20g) L (12 PM): 1/2 hot dog or 1/2 hamburger OR 2 oz chicken leg or salad with ham and [place] (7-14g) Snk: 3 strawberries and blueberries OR goldfish OR almonds D (6PM): hotdog, sometimes with a bun (7-14g) Snk (PM): popcorn, graham crackers or shortbread cookies  Fluid intake: diet cranberry juice and water (close to 64 oz) Estimated total protein intake: 56 g per day  Medications: see list Supplementation: taking   Using straws: yes, no gas pain Drinking while eating: some sips Hair loss: yes Carbonated beverages: diet Mountain Dew N/V/D/C: constipation resolved; vomiting when she eats and  talks Dumping syndrome: no  Recent physical activity: none  Progress Towards Goal(s):  In progress.  Nutritional Diagnosis:  [place]-3.3 Overweight/obesity related to past poor dietary habits and physical inactivity as evidenced by patient w/ recent gastric sleeve surgery following dietary guidelines for continued weight loss.   Intervention:  Nutrition counseling provided.  Teaching Method Utilized:  Visual Auditory Hands on  Barriers to learning/adherence to lifestyle change: unable to drink water at work  Demonstrated degree of understanding via:  Teach Back   Monitoring/Evaluation:  Dietary intake, exercise, and body weight. Follow up in 1.5 months for 12 month post-op visit.

## 2014-11-29 ENCOUNTER — Other Ambulatory Visit: Payer: Self-pay | Admitting: Gastroenterology

## 2015-01-06 ENCOUNTER — Encounter: Payer: 59 | Attending: Surgery | Admitting: Dietician

## 2015-01-06 DIAGNOSIS — Z6835 Body mass index (BMI) 35.0-35.9, adult: Secondary | ICD-10-CM | POA: Insufficient documentation

## 2015-01-06 DIAGNOSIS — Z713 Dietary counseling and surveillance: Secondary | ICD-10-CM | POA: Insufficient documentation

## 2015-02-03 ENCOUNTER — Ambulatory Visit: Payer: 59 | Admitting: Dietician

## 2015-02-21 ENCOUNTER — Encounter: Payer: Self-pay | Admitting: Dietician

## 2015-02-21 ENCOUNTER — Encounter: Payer: 59 | Attending: Surgery | Admitting: Dietician

## 2015-02-21 DIAGNOSIS — Z6835 Body mass index (BMI) 35.0-35.9, adult: Secondary | ICD-10-CM | POA: Diagnosis not present

## 2015-02-21 DIAGNOSIS — Z713 Dietary counseling and surveillance: Secondary | ICD-10-CM | POA: Insufficient documentation

## 2015-02-21 NOTE — Patient Instructions (Addendum)
Goals:  Increase fluid intake to 64oz +  Avoid drinking 15 minutes before, during and 30 minutes after eating  Have a protein food with each meal and snack (especially with popcorn at night)  Continue to focus on meat and vegetables as the basis of your diet and enjoy "play" foods in moderation (remove trigger foods from the house)  Get back on track after vacation  Redevelop routine, go to grocery store with a list, prepare meals and snacks ahead of time  Limit tropical fruits like mango or pineapple  Work on leaving the last few bites of food on your plate if you are full  Continue to use small plates and containers  Continue to prepare ahead of time  Meat first, then vegetables, then carbs  Go back to chewable vitamins when you finish the gummies   POTASSIUM: For a reference point, a banana contains 422 mg potassium.  Fruit:  ?Avocado, 1/2 avocado has 487 mg ?Cantaloupe, 1 cup has 473 mg ?Honeydew, 1 cup has 404 mg ?Cherries, 1 cup has 306 mg   Vegetables    ?Zucchini,  cup cooked has 280 mg potassium ?Tomato, 1 medium has 292 mg ?Potato, 1/2 cup has 316 mg ?Sweet potato, 1/2 cup has 224 mg  Legumes    ?Soybeans,  cup cooked has 440 mg potassium ?Lentils,  cup cooked has 370 mg potassium ?Kidney beans,  cup cooked has 360 mg potassium ?White beans, canned, drained, half cup: 595 mg  Protein   ?Yogurt, fat-free, 1 cup: 579 mg ?Halibut, 3 ounces, cooked: 490 mg ?Pork tenderloin, 3 ounces, cooked: 382 mg ?Milk, skim or soy, 8 ounces: 340-410 mg ?Salmon, [place], 3 ounces, cooked: 326 mg ?Chicken breast, 3 ounces, cooked: 218 mg ?Tuna, light, canned, drained, 3 ounces: 201 mg   Surgery date: 12/31/2013 Surgery type: Gastric sleeve Start weight at [facility]: 205.5 lbs Weight today: 149.5 lbs Weight change: 0.5 lbs Total weight lost: 56 lbs Goal weight: 140-150 lbs   TANITA  BODY COMP RESULTS  12/24/13 01/15/14 02/25/14 04/08/14 05/20/14 07/03/14  08/20/14 10/02/14 11/20/14 02/21/15   BMI (kg/m^2) 36.5 33.5 31.2 28.7 27.5 27 27.3 27.4 27.3 27.7   Fat Mass (lbs) 102 88.0 72.5 63 57.5 51 51 55 56.5 56.5   Fat Free Mass (lbs) 97.5 95.0 98 94 93 96.5 98.5 95 93 95   Total Body Water (lbs) 71.5 69.5 71.5 69 68 70.5 72 69.5 68 69.5

## 2015-02-21 NOTE — Progress Notes (Signed)
  Follow-up visit:  13.5 months Post-Operative Gastric sleeve Surgery  Medical Nutrition Therapy:  Appt start time: 925 end time: 1000  Primary concerns today: Post-operative Bariatric Surgery Nutrition Management.  Chelsea Landry returns today having maintained her fat mass. She is okay to stay at her current weight. Continues to meal plan and pre portion her foods. Eats from small plates. She continues to struggle with fluid intake. She notices she bruises very easily. Recently got lab work and everything was within normal limits. Still hoping to come off blood pressure medication.   Surgery date: 12/31/2013 Surgery type: Gastric sleeve Start weight at [facility]: 205.5 lbs Weight today: 149.5 lbs Weight change: 0.5 lbs Total weight lost: 56 lbs Goal weight: 140-150 lbs   TANITA  BODY COMP RESULTS  12/24/13 01/15/14 02/25/14 04/08/14 05/20/14 07/03/14 08/20/14 10/02/14 11/20/14 02/21/15   BMI (kg/m^2) 36.5 33.5 31.2 28.7 27.5 27 27.3 27.4 27.3 27.7   Fat Mass (lbs) 102 88.0 72.5 63 57.5 51 51 55 56.5 56.5   Fat Free Mass (lbs) 97.5 95.0 98 94 93 96.5 98.5 95 93 95   Total Body Water (lbs) 71.5 69.5 71.5 69 68 70.5 72 69.5 68 69.5    Preferred Learning Style:  No preference indicated   Learning Readiness:  Ready  24-hr recall: B (6:30-6:50 AM): 3 links of [place] sausage and cheese (16g) Snk (AM): cheese stick or almonds (6g) Snk (AM): 1/2 Premier protein bar or Unjury and vanilla yogurt or cottage cheese with peaches (15-20g) L (12 PM): 1/2 hot dog or 1/2 hamburger OR 2 oz chicken leg or salad with ham and [place] (7-14g) Snk: 3 strawberries and blueberries OR goldfish OR almonds D (6PM): hotdog, sometimes with a bun (7-14g) Snk (PM): popcorn, graham crackers or shortbread cookies  Fluid intake: diet cranberry juice and water (close to 64 oz), inconsistent Estimated total protein intake: 56 g per day  Medications: see list Supplementation: taking   Using straws: yes, no gas pain Drinking  while eating: some sips Hair loss: yes Carbonated beverages: diet Mountain Dew N/V/D/C: constipation resolved; vomiting when she eats and talks Dumping syndrome: no  Recent physical activity: 30 min on treadmill about 3x a week   Progress Towards Goal(s):  In progress.  Nutritional Diagnosis:  [place]-3.3 Overweight/obesity related to past poor dietary habits and physical inactivity as evidenced by patient w/ recent gastric sleeve surgery following dietary guidelines for continued weight loss.   Intervention:  Nutrition counseling provided.  Teaching Method Utilized:  Visual Auditory Hands on  Barriers to learning/adherence to lifestyle change: unable to drink water at work  Demonstrated degree of understanding via:  Teach Back   Monitoring/Evaluation:  Dietary intake, exercise, and body weight. Follow up in 2 months for 15.5 month post-op visit.

## 2015-04-24 ENCOUNTER — Encounter: Payer: 59 | Attending: Surgery | Admitting: Dietician

## 2015-04-24 ENCOUNTER — Encounter: Payer: Self-pay | Admitting: Dietician

## 2015-04-24 DIAGNOSIS — Z713 Dietary counseling and surveillance: Secondary | ICD-10-CM | POA: Insufficient documentation

## 2015-04-24 DIAGNOSIS — Z6835 Body mass index (BMI) 35.0-35.9, adult: Secondary | ICD-10-CM | POA: Insufficient documentation

## 2015-04-24 NOTE — Progress Notes (Signed)
  Follow-up visit:  15.5 months Post-Operative Gastric sleeve Surgery  Medical Nutrition Therapy:  Appt start time: 425 end time: 450  Primary concerns today: Post-operative Bariatric Surgery Nutrition Management.  Chelsea Landry returns today having gained 2 lbs fat. She is disappointed to have gained some weight. Has been eating some [employer] cookies and jellybeans and has not been exercising consistently. Her blood pressure has also been elevated. Was taken off HCTZ after surgery and is currently just taking Losartan.    Surgery date: 12/31/2013 Surgery type: Gastric sleeve Start weight at [facility]: 205.5 lbs Weight today: 156 lbs Weight change: 6.5 lbs gain  Total weight lost: 49.5 lbs Goal weight: 150 lbs   TANITA  BODY COMP RESULTS  12/24/13 01/15/14 02/25/14 04/08/14 05/20/14 07/03/14 08/20/14 10/02/14 11/20/14 02/21/15 04/24/15   BMI (kg/m^2) 36.5 33.5 31.2 28.7 27.5 27 27.3 27.4 27.3 27.7 28.5   Fat Mass (lbs) 102 88.0 72.5 63 57.5 51 51 55 56.5 56.5 58.5   Fat Free Mass (lbs) 97.5 95.0 98 94 93 96.5 98.5 95 93 95 97.5   Total Body Water (lbs) 71.5 69.5 71.5 69 68 70.5 72 69.5 68 69.5 71.5    Preferred Learning Style:  No preference indicated   Learning Readiness:  Ready  24-hr recall: B (6:30-6:50 AM): 3 links of [place] sausage and cheese (16g) Snk (AM): cheese stick or almonds (6g) Snk (AM): 1/2 Premier protein bar or Unjury and vanilla yogurt or cottage cheese with peaches (15-20g) L (12 PM): 1/2 hot dog or 1/2 hamburger OR 2 oz chicken leg or salad with ham and [place] (7-14g) Snk: 3 strawberries and blueberries OR goldfish OR almonds D (6PM): hotdog, sometimes with a bun (7-14g) Snk (PM): popcorn, graham crackers or shortbread cookies  Fluid intake: diet cranberry juice and water (close to 64 oz), inconsistent Estimated total protein intake: 56 g per day  Medications: see list Supplementation: taking   Using straws: yes, no gas pain Drinking while eating: some sips Hair loss:  yes Carbonated beverages: diet Mountain Dew N/V/D/C: constipation resolved; vomiting when she eats too much/fast Dumping syndrome: no  Recent physical activity: inconsistent  Progress Towards Goal(s):  In progress.  Nutritional Diagnosis:  [place]-3.3 Overweight/obesity related to past poor dietary habits and physical inactivity as evidenced by patient w/ recent gastric sleeve surgery following dietary guidelines for continued weight loss.   Intervention:  Nutrition counseling provided.  Handouts provided: Pre op diet  Teaching Method Utilized:  Visual Auditory Hands on  Barriers to learning/adherence to lifestyle change: unable to drink water at work  Demonstrated degree of understanding via:  Teach Back   Monitoring/Evaluation:  Dietary intake, exercise, and body weight. Follow up in 4-6 weeks for 17 month post-op visit.

## 2015-04-24 NOTE — Patient Instructions (Addendum)
Goals:  Increase fluid intake to 64oz +  Avoid drinking 15 minutes before, during and 30 minutes after eating  Have a protein food with each meal and snack (especially with popcorn at night)  Continue to focus on meat and vegetables as the basis of your diet and enjoy "play" foods in moderation (remove trigger foods from the house)  Get back on track after vacation  Redevelop routine, go to grocery store with a list, prepare meals and snacks ahead of time  Limit tropical fruits like mango or pineapple  Work on leaving the last few bites of food on your plate if you are full  Continue to use small plates and containers  Continue to prepare ahead of time  Meat first, then vegetables, then carbs  Refer to pre op diet  Avoid buying sweets and other trigger foods  Take a vitamin or calcium when you're craving something sweet  Get out and walk more and ENJOY it!  Practice eating slowly and mindfully  Start keeping a food log  Contact PCP if blood pressure remains high   POTASSIUM: For a reference point, a banana contains 422 mg potassium.  Fruit:  ?Avocado, 1/2 avocado has 487 mg ?Cantaloupe, 1 cup has 473 mg ?Honeydew, 1 cup has 404 mg ?Cherries, 1 cup has 306 mg   Vegetables    ?Zucchini,  cup cooked has 280 mg potassium ?Tomato, 1 medium has 292 mg ?Potato, 1/2 cup has 316 mg ?Sweet potato, 1/2 cup has 224 mg  Legumes    ?Soybeans,  cup cooked has 440 mg potassium ?Lentils,  cup cooked has 370 mg potassium ?Kidney beans,  cup cooked has 360 mg potassium ?White beans, canned, drained, half cup: 595 mg  Protein   ?Yogurt, fat-free, 1 cup: 579 mg ?Halibut, 3 ounces, cooked: 490 mg ?Pork tenderloin, 3 ounces, cooked: 382 mg ?Milk, skim or soy, 8 ounces: 340-410 mg ?Salmon, [place], 3 ounces, cooked: 326 mg ?Chicken breast, 3 ounces, cooked: 218 mg ?Tuna, light, canned, drained, 3 ounces: 201 mg

## 2015-04-25 ENCOUNTER — Ambulatory Visit: Payer: 59 | Admitting: Dietician

## 2015-05-28 ENCOUNTER — Ambulatory Visit: Payer: 59 | Admitting: Dietician

## 2015-06-05 ENCOUNTER — Encounter: Payer: 59 | Attending: Surgery | Admitting: Dietician

## 2015-06-05 DIAGNOSIS — Z713 Dietary counseling and surveillance: Secondary | ICD-10-CM | POA: Insufficient documentation

## 2015-06-05 DIAGNOSIS — Z6835 Body mass index (BMI) 35.0-35.9, adult: Secondary | ICD-10-CM | POA: Diagnosis not present

## 2015-06-05 NOTE — Patient Instructions (Addendum)
Goals:  Increase fluid intake to 64oz +  Avoid drinking 15 minutes before, during and 30 minutes after eating  Have a protein food with each meal and snack (especially with popcorn at night)  Continue to focus on meat and vegetables as the basis of your diet and enjoy "play" foods in moderation (remove trigger foods from the house)  Get back on track after vacation  [employer]velop routine, go to grocery store with a list, prepare meals and snacks ahead of time  Limit tropical fruits like mango or pineapple  Work on leaving the last few bites of food on your plate if you are full  Continue to use small plates and containers  Continue to prepare ahead of time  Meat first, then vegetables, then carbs  Refer to pre op diet  Avoid buying sweets and other trigger foods  Take a vitamin or calcium when you're craving something sweet  Practice eating slowly and mindfully  Start keeping a food log  Contact PCP if blood pressure remains high  Focus on your positives: more active and more fluid!  Pre portion small baggies of treats to bring to work and include healthy options like almonds and tomatoes  Set yourself up for success!  Limit fruit to a total of 3/4 cup in smoothie, add protein powder   TANITA  BODY COMP RESULTS  12/24/13 01/15/14 02/25/14 04/08/14 05/20/14 07/03/14 08/20/14 10/02/14 11/20/14 02/21/15 04/24/15 06/05/15   BMI (kg/m^2) 36.5 33.5 31.2 28.7 27.5 27 27.3 27.4 27.3 27.7 28.5 28.8   Fat Mass (lbs) 102 88.0 72.5 63 57.5 51 51 55 56.5 56.5 58.5 58.6   Fat Free Mass (lbs) 97.5 95.0 98 94 93 96.5 98.5 95 93 95 97.5 98.6   Total Body Water (lbs) 71.5 69.5 71.5 69 68 70.5 72 69.5 68 69.5 71.5 68.6     POTASSIUM: For a reference point, a banana contains 422 mg potassium.  Fruit:  ?Avocado, 1/2 avocado has 487 mg ?Cantaloupe, 1 cup has 473 mg ?Honeydew, 1 cup has 404 mg ?Cherries, 1 cup has 306 mg   Vegetables    ?Zucchini,  cup cooked has 280 mg  potassium ?Tomato, 1 medium has 292 mg ?Potato, 1/2 cup has 316 mg ?Sweet potato, 1/2 cup has 224 mg  Legumes    ?Soybeans,  cup cooked has 440 mg potassium ?Lentils,  cup cooked has 370 mg potassium ?Kidney beans,  cup cooked has 360 mg potassium ?White beans, canned, drained, half cup: 595 mg  Protein   ?Yogurt, fat-free, 1 cup: 579 mg ?Halibut, 3 ounces, cooked: 490 mg ?Pork tenderloin, 3 ounces, cooked: 382 mg ?Milk, skim or soy, 8 ounces: 340-410 mg ?Salmon, [place], 3 ounces, cooked: 326 mg ?Chicken breast, 3 ounces, cooked: 218 mg ?Tuna, light, canned, drained, 3 ounces: 201 mg

## 2015-06-05 NOTE — Progress Notes (Signed)
  Follow-up visit:  1.5 Years Post-Operative Gastric sleeve Surgery  Medical Nutrition Therapy:  Appt start time: 420 end time: 500  Primary concerns today: Post-operative Bariatric Surgery Nutrition Management.  Chelsea Landry returns today having gained 1 lb. She states that she is still discouraged with weight. Feels like she is able to maintain a healthy diet best when she is working 1st shift. Eats more "junk" and more food overall when she works 2nd or 3rd shift. She is now able to have water at her work station so her fluid intake is improving. She has also been more active lately.  Surgery date: 12/31/2013 Surgery type: Gastric sleeve Start weight at [facility]: 205.5 lbs Weight today: 156 lbs Weight change: 6.5 lbs gain  Total weight lost: 49.5 lbs Goal weight: 150 lbs   TANITA  BODY COMP RESULTS  12/24/13 01/15/14 02/25/14 04/08/14 05/20/14 07/03/14 08/20/14 10/02/14 11/20/14 02/21/15 04/24/15 06/05/15   BMI (kg/m^2) 36.5 33.5 31.2 28.7 27.5 27 27.3 27.4 27.3 27.7 28.5 28.8   Fat Mass (lbs) 102 88.0 72.5 63 57.5 51 51 55 56.5 56.5 58.5 58.6   Fat Free Mass (lbs) 97.5 95.0 98 94 93 96.5 98.5 95 93 95 97.5 98.6   Total Body Water (lbs) 71.5 69.5 71.5 69 68 70.5 72 69.5 68 69.5 71.5 68.6    Preferred Learning Style:  No preference indicated   Learning Readiness:  Ready  24-hr recall: B (6:30-6:50 AM): 3 links of [place] sausage and cheese (16g) Snk (AM): cheese stick or almonds (6g) Snk (AM): 1/2 Premier protein bar or Unjury and vanilla yogurt or cottage cheese with peaches (15-20g) L (12 PM): 1/2 hot dog or 1/2 hamburger OR 2 oz chicken leg or salad with ham and [place] (7-14g) Snk: 3 strawberries and blueberries OR goldfish OR almonds D (6PM): hotdog, sometimes with a bun (7-14g) Snk (PM): popcorn, graham crackers or shortbread cookies  Fluid intake: diet cranberry juice and water, diet green tea (close to 64 oz), inconsistent Estimated total protein intake: 56 g per day  Medications: see  list Supplementation: taking   Using straws: yes, no gas pain Drinking while eating: some sips Hair loss: yes Carbonated beverages: diet Mountain Dew N/V/D/C: constipation resolved; vomiting when she eats too much/fast Dumping syndrome: no  Recent physical activity: walking and going to the gym (a few times a week, varies)  Progress Towards Goal(s):  In progress.  Nutritional Diagnosis:  [name]-3.3 Overweight/obesity related to past poor dietary habits and physical inactivity as evidenced by patient w/ recent gastric sleeve surgery following dietary guidelines for continued weight loss.   Intervention:  Nutrition counseling provided.  Handouts provided: Pre op diet  Teaching Method Utilized:  Visual Auditory Hands on  Barriers to learning/adherence to lifestyle change: unable to drink water at work  Demonstrated degree of understanding via:  Teach Back   Monitoring/Evaluation:  Dietary intake, exercise, and body weight. Follow up in 4-6 weeks for 17 month post-op visit.

## 2015-06-11 ENCOUNTER — Encounter: Payer: Self-pay | Admitting: Dietician

## 2015-07-24 ENCOUNTER — Encounter: Payer: 59 | Attending: Surgery | Admitting: Dietician

## 2015-07-24 DIAGNOSIS — Z713 Dietary counseling and surveillance: Secondary | ICD-10-CM | POA: Insufficient documentation

## 2015-07-24 DIAGNOSIS — Z6835 Body mass index (BMI) 35.0-35.9, adult: Secondary | ICD-10-CM | POA: Diagnosis not present

## 2015-07-24 NOTE — Patient Instructions (Addendum)
Goals:  Increase fluid intake to 64oz +  Avoid drinking 15 minutes before, during and 30 minutes after eating  Have a protein food with each meal and snack (especially with popcorn at night)  Continue to focus on meat and vegetables as the basis of your diet and enjoy "play" foods in moderation (remove trigger foods from the house)  Get back on track after vacation  [employer] routine, go to grocery store with a list, prepare meals and snacks ahead of time  Limit tropical fruits like mango or pineapple  Work on leaving the last few bites of food on your plate if you are full  Continue to use small plates and containers  Continue to prepare ahead of time  Meat first, then vegetables, then carbs  Refer to pre op diet  Avoid buying sweets and other trigger foods  Take a vitamin or calcium when you're craving something sweet  Practice eating slowly and mindfully  Focus on your positives: more active and more fluid!  Pre portion small baggies of treats to bring to work and include healthy options like almonds and tomatoes  Set yourself up for success!  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  Fruit:  ?Avocado, 1/2 avocado has 487 mg ?Cantaloupe, 1 cup has 473 mg ?Honeydew, 1 cup has 404 mg ?Cherries, 1 cup has 306 mg   Vegetables    ?Zucchini,  cup cooked has 280 mg potassium ?Tomato, 1 medium has 292 mg ?Potato, 1/2 cup has 316 mg ?Sweet potato, 1/2 cup has 224 mg  Legumes     ?Soybeans,  cup cooked has 440 mg potassium ?Lentils,  cup cooked has 370 mg potassium ?Kidney beans,  cup cooked has 360 mg potassium ?White beans, canned, drained, half cup: 595 mg  Protein   ?Yogurt, fat-free, 1 cup: 579 mg ?Halibut, 3 ounces, cooked: 490 mg ?Pork tenderloin, 3 ounces, cooked: 382 mg ?Milk, skim or soy, 8 ounces: 340-410 mg ?Salmon, Anon Raices, 3 ounces, cooked: 326 mg ?Chicken breast, 3 ounces, cooked: 218 mg ?Tuna, light, canned, drained, 3 ounces: 201 mg

## 2015-07-24 NOTE — Progress Notes (Signed)
  Follow-up visit:  1.5 Years Post-Operative Gastric sleeve Surgery  Medical Nutrition Therapy:  Appt start time: 415 end time: 500   Primary concerns today: Post-operative Bariatric Surgery Nutrition Management.  Chasitity returns today having gained 2 lbs. She states that she is still discouraged with weight. States she sometimes eats just because she sees food. She considers herself a stress eater. Continues to follow a healthy diet with small portions but may overindulge in "bad" food. Has gotten out of her exercise routine. Catoya will be starting 12-hour swing shifts at the end of July and this is causing anxiety.  Surgery date: 12/31/2013 Surgery type: Gastric sleeve Start weight at [facility]: 205.5 lbs  Weight today: 158 lbs Weight change: 2 lbs gain  Total weight lost: 47.5 lbs Goal weight: 150 lbs   TANITA  BODY COMP RESULTS  12/24/13 01/15/14 02/25/14 04/08/14 05/20/14 07/03/14 08/20/14 10/02/14 11/20/14 02/21/15 04/24/15 06/05/15 07/24/15   BMI (kg/m^2) 36.5 33.5 31.2 28.7 27.5 27 27.3 27.4 27.3 27.7 28.5 28.8 28.9   Fat Mass (lbs) 102 88.0 72.5 63 57.5 51 51 55 56.5 56.5 58.5 58.6 62   Fat Free Mass (lbs) 97.5 95.0 98 94 93 96.5 98.5 95 93 95 97.5 98.6 96   Total Body Water (lbs) 71.5 69.5 71.5 69 68 70.5 72 69.5 68 69.5 71.5 68.6 67    Preferred Learning Style:  No preference indicated   Learning Readiness:  Ready  24-hr recall: B (6:30-6:50 AM): 3 links of [place] sausage and cheese (16g) Snk (AM): cheese stick or almonds (6g) Snk (AM): 1/2 Premier protein bar or Unjury and vanilla yogurt or cottage cheese with peaches (15-20g) L (12 PM): 1/2 hot dog or 1/2 hamburger OR 2 oz chicken leg or salad with ham and [place] (7-14g) Snk: 3 strawberries and blueberries OR goldfish OR almonds D (6PM): hotdog, sometimes with a bun (7-14g) Snk (PM): popcorn, graham crackers or shortbread cookies  Fluid intake: diet cranberry juice and water, diet green tea (close to 64 oz),  inconsistent Estimated total protein intake: 56 g per day  Medications: see list Supplementation: taking   Using straws: yes, no gas pain Drinking while eating: some sips Hair loss: yes Carbonated beverages: diet Mountain Dew N/V/D/C: constipation resolved; vomiting when she eats too much/fast Dumping syndrome: no  Recent physical activity: walking and going to the gym (a few times a week, varies)  Progress Towards Goal(s):  In progress.  Nutritional Diagnosis:  [place]-3.3 Overweight/obesity related to past poor dietary habits and physical inactivity as evidenced by patient w/ recent gastric sleeve surgery following dietary guidelines for continued weight loss.   Intervention:  Nutrition counseling provided.  Handouts provided: Pre op diet  Teaching Method Utilized:  Visual Auditory Hands on  Barriers to learning/adherence to lifestyle change: none  Demonstrated degree of understanding via:  Teach Back   Monitoring/Evaluation:  Dietary intake, exercise, and body weight. Follow up in 6 weeks.

## 2015-07-25 ENCOUNTER — Encounter: Payer: Self-pay | Admitting: Dietician

## 2015-09-15 ENCOUNTER — Other Ambulatory Visit: Payer: Self-pay | Admitting: Obstetrics and Gynecology

## 2015-09-15 DIAGNOSIS — Z1231 Encounter for screening mammogram for malignant neoplasm of breast: Secondary | ICD-10-CM

## 2015-09-22 ENCOUNTER — Encounter: Payer: 59 | Attending: Surgery | Admitting: Dietician

## 2015-09-22 ENCOUNTER — Encounter: Payer: Self-pay | Admitting: Dietician

## 2015-09-22 DIAGNOSIS — Z6835 Body mass index (BMI) 35.0-35.9, adult: Secondary | ICD-10-CM | POA: Diagnosis not present

## 2015-09-22 DIAGNOSIS — Z713 Dietary counseling and surveillance: Secondary | ICD-10-CM | POA: Diagnosis not present

## 2015-09-22 NOTE — Progress Notes (Signed)
  Follow-up visit:  1.75 Years Post-Operative Gastric sleeve Surgery  Medical Nutrition Therapy:  Appt start time: [id number] end time: 1215   Primary concerns today: Post-operative Bariatric Surgery Nutrition Management.  Rosio returns today having gained 5 pounds. Feeling frustrated and depressed about her weight. States that she does not overeat. Lowest weight was 149 lbs and she reports she began to regain weight when she started eating starches again. Craves sweets. Doesn't feel like meal prepping for several days works well for her.   Surgery date: 12/31/2013 Surgery type: Gastric sleeve Start weight at [facility]: 205.5 lbs Weight today: 163.2 lbs Weight change: 5 lbs gain  Total weight lost: 42 lbs Goal weight: 150 lbs   TANITA  BODY COMP RESULTS  12/24/13 01/15/14 02/25/14 04/08/14 05/20/14 07/03/14 08/20/14 10/02/14 11/20/14 02/21/15 04/24/15 06/05/15 07/24/15 09/22/15   BMI (kg/m^2) 36.5 33.5 31.2 28.7 27.5 27 27.3 27.4 27.3 27.7 28.5 28.8 28.9 29.8   Fat Mass (lbs) 102 88.0 72.5 63 57.5 51 51 55 56.5 56.5 58.5 58.6 62 70.8   Fat Free Mass (lbs) 97.5 95.0 98 94 93 96.5 98.5 95 93 95 97.5 98.6 96 92.4   Total Body Water (lbs) 71.5 69.5 71.5 69 68 70.5 72 69.5 68 69.5 71.5 68.6 67 64.4    Preferred Learning Style:  No preference indicated   Learning Readiness:  Ready  24-hr recall: B (6:30-6:50 AM): 3 links of [place] sausage and cheese (16g) Snk (AM): cheese stick or almonds (6g) Snk (AM): 1/2 Premier protein bar or Unjury and vanilla yogurt or cottage cheese with peaches (15-20g) L (12 PM): 1/2 hot dog or 1/2 hamburger OR 2 oz chicken leg or salad with ham and [place] (7-14g) Snk: 3 strawberries and blueberries OR goldfish OR almonds D (6PM): hotdog, sometimes with a bun (7-14g) Snk (PM): popcorn, graham crackers or shortbread cookies  Fluid intake: diet cranberry juice and water, diet green tea (close to 64 oz), inconsistent Estimated total protein intake: 56 g per day  Medications: see  list Supplementation: taking   Using straws: yes, no gas pain Drinking while eating: some sips Hair loss: yes Carbonated beverages: diet Mountain Dew N/V/D/C: constipation resolved; vomiting when she eats too much/fast Dumping syndrome: no  Recent physical activity: walking and going to the gym (a few times a week, varies)  Progress Towards Goal(s):  In progress.  Nutritional Diagnosis:  [place]-3.3 Overweight/obesity related to past poor dietary habits and physical inactivity as evidenced by patient w/ recent gastric sleeve surgery following dietary guidelines for continued weight loss.   Intervention:  Nutrition counseling provided.  Handouts provided: Pre op diet  Teaching Method Utilized:  Visual Auditory Hands on  Barriers to learning/adherence to lifestyle change: none  Demonstrated degree of understanding via:  Teach Back   Monitoring/Evaluation:  Dietary intake, exercise, and body weight. Follow up in 2 months.

## 2015-09-22 NOTE — Patient Instructions (Addendum)
Goals: -Always eat breakfast!  -Make sure to eat enough in the beginning of the day -Make an appointment with Dr. Ardath Sax -Make healthy foods more accessible -Treat yourself to a Quest bar or an Oh Yeah One bar when you're grocery shopping and tempted to buy candy

## 2015-10-13 ENCOUNTER — Ambulatory Visit
Admission: RE | Admit: 2015-10-13 | Discharge: 2015-10-13 | Disposition: A | Discharge status: Home / Self Care | Payer: 59 | Source: Ambulatory Visit | Attending: Obstetrics and Gynecology | Admitting: Obstetrics and Gynecology

## 2015-10-13 DIAGNOSIS — Z1231 Encounter for screening mammogram for malignant neoplasm of breast: Secondary | ICD-10-CM

## 2015-11-17 ENCOUNTER — Ambulatory Visit: Payer: 59 | Admitting: Dietician

## 2015-11-18 ENCOUNTER — Encounter: Payer: Self-pay | Admitting: Dietician

## 2015-11-18 ENCOUNTER — Encounter: Payer: 59 | Attending: Surgery | Admitting: Dietician

## 2015-11-18 DIAGNOSIS — Z6835 Body mass index (BMI) 35.0-35.9, adult: Secondary | ICD-10-CM | POA: Diagnosis not present

## 2015-11-18 DIAGNOSIS — Z713 Dietary counseling and surveillance: Secondary | ICD-10-CM | POA: Insufficient documentation

## 2015-11-18 NOTE — Patient Instructions (Addendum)
Goals:  -Keep up your walking routine!  -Keep Baker Janus as your walking partner  -Set walking dates in your calendar  -Think about signing up for another walk or 5k    TANITA  BODY COMP RESULTS  12/24/13 01/15/14 02/25/14 04/08/14 05/20/14 07/03/14 08/20/14 10/02/14 11/20/14 02/21/15 04/24/15 06/05/15 07/24/15 09/22/15 11/18/15   BMI (kg/m^2) 36.5 33.5 31.2 28.7 27.5 27 27.3 27.4 27.3 27.7 28.5 28.8 28.9 29.8 30.1   Fat Mass (lbs) 102 88.0 72.5 63 57.5 51 51 55 56.5 56.5 58.5 58.6 62 70.8 66.2   Fat Free Mass (lbs) 97.5 95.0 98 94 93 96.5 98.5 95 93 95 97.5 98.6 96 92.4 98.4   Total Body Water (lbs) 71.5 69.5 71.5 69 68 70.5 72 69.5 68 69.5 71.5 68.6 67 64.4 68.8

## 2015-11-18 NOTE — Progress Notes (Signed)
  Follow-up visit:  1.75 Years Post-Operative Gastric sleeve Surgery  Medical Nutrition Therapy:  Appt start time: 420 end time: 500   Primary concerns today: Post-operative Bariatric Surgery Nutrition Management.  Chelsea Landry returns today having  gained one pound overall but has lost over 4 lbs fat per Tanita body composition scale. She reports that she is finally feeling good about her weight loss and is satisfied "as long as I can fit in a size 12." Has been walking more. Still struggles to meet fluid needs.  Surgery date: 12/31/2013 Surgery type: Gastric sleeve Start weight at [facility]: 205.5 lbs Weight today: 163.2 lbs Weight change: 5 lbs gain  Total weight lost: 42 lbs Goal weight: 150 lbs   TANITA  BODY COMP RESULTS  12/24/13 01/15/14 02/25/14 04/08/14 05/20/14 07/03/14 08/20/14 10/02/14 11/20/14 02/21/15 04/24/15 06/05/15 07/24/15 09/22/15 11/18/15   BMI (kg/m^2) 36.5 33.5 31.2 28.7 27.5 27 27.3 27.4 27.3 27.7 28.5 28.8 28.9 29.8 30.1   Fat Mass (lbs) 102 88.0 72.5 63 57.5 51 51 55 56.5 56.5 58.5 58.6 62 70.8 66.2   Fat Free Mass (lbs) 97.5 95.0 98 94 93 96.5 98.5 95 93 95 97.5 98.6 96 92.4 98.4   Total Body Water (lbs) 71.5 69.5 71.5 69 68 70.5 72 69.5 68 69.5 71.5 68.6 67 64.4 68.8    Preferred Learning Style:  No preference indicated   Learning Readiness:  Ready  24-hr recall: B (6:30-6:50 AM): 2 links of [place] sausage and oatmeal (16g) Snk (AM): cheese stick or almonds (6g) Snk (AM): 1/2 Premier protein bar or Unjury and vanilla yogurt or cottage cheese with peaches (15-20g) L (12 PM): 1/2 hot dog or 1/2 hamburger OR 2 oz chicken leg or salad with ham and [place] (7-14g) Snk: 3 strawberries and blueberries OR goldfish OR almonds D (6PM): chili and cornbread (10g) Snk (PM): popcorn, graham crackers or shortbread cookies  Fluid intake: diet cranberry juice and water, diet green tea (close to 64 oz), inconsistent Estimated total protein intake: 56 g per day  Medications: see  list Supplementation: taking   Using straws: yes, no gas pain Drinking while eating: some sips Hair loss: yes Carbonated beverages: diet Mountain Dew N/V/D/C: constipation resolved; vomiting when she eats too much/fast Dumping syndrome: no  Recent physical activity: walking and going to the gym (a few times a week, varies)  Progress Towards Goal(s):  In progress.  Nutritional Diagnosis:  [place]-3.3 Overweight/obesity related to past poor dietary habits and physical inactivity as evidenced by patient w/ recent gastric sleeve surgery following dietary guidelines for continued weight loss.   Intervention:  Nutrition counseling provided.  Handouts provided: Pre op diet  Teaching Method Utilized:  Visual Auditory Hands on  Barriers to learning/adherence to lifestyle change: none  Demonstrated degree of understanding via:  Teach Back   Monitoring/Evaluation:  Dietary intake, exercise, and body weight. Follow up in 2 months.

## 2015-11-19 ENCOUNTER — Ambulatory Visit (INDEPENDENT_AMBULATORY_CARE_PROVIDER_SITE_OTHER): Payer: 59 | Admitting: Podiatry

## 2015-11-19 ENCOUNTER — Encounter: Payer: Self-pay | Admitting: Podiatry

## 2015-11-19 ENCOUNTER — Ambulatory Visit (INDEPENDENT_AMBULATORY_CARE_PROVIDER_SITE_OTHER): Payer: 59

## 2015-11-19 VITALS — BP 112/72 | HR 74 | Resp 16

## 2015-11-19 DIAGNOSIS — M2041 Other hammer toe(s) (acquired), right foot: Secondary | ICD-10-CM

## 2015-11-19 DIAGNOSIS — M779 Enthesopathy, unspecified: Secondary | ICD-10-CM

## 2015-11-19 DIAGNOSIS — L600 Ingrowing nail: Secondary | ICD-10-CM

## 2015-11-19 DIAGNOSIS — M79671 Pain in right foot: Secondary | ICD-10-CM

## 2015-11-19 DIAGNOSIS — L84 Corns and callosities: Secondary | ICD-10-CM | POA: Diagnosis not present

## 2015-11-19 MED ORDER — TRIAMCINOLONE ACETONIDE 10 MG/ML IJ SUSP
10.0000 mg | Freq: Once | INTRAMUSCULAR | Status: AC
  Administered 2015-11-19: 10 mg

## 2015-11-20 NOTE — Progress Notes (Signed)
Subjective:     Patient ID: Chelsea Landry, female   DOB: 10/10/1955, 60 y.o.   MRN: [id number]  HPI patient presents stating that this fifth digit on my right foot has become quite inflamed and pain and makes it hard for me to wear shoe gear comfortably   Review of Systems     Objective:   Physical Exam Neurovascular status intact with mild to moderate rotation fifth digit right with prominence of the head of the proximal phalanx and inflammation fluid buildup with keratotic lesion formation    Assessment:     Hammertoe deformity fifth digit right with inflammation of the toe and fluid buildup noted    Plan:     H&P conditions reviewed and did a proximal nerve block of the digit. I discussed hammertoe and possible long-term arthroplasty and today I did go ahead and I injected the capsule with 2 mg Dexon some Kenalog and then did full debridement of lesion and applied padding. Reviewed x-ray with patient  X-ray indicates that there is mild rotation fifth toe with enlargement had a proximal phalanx fifth digit

## 2015-12-02 ENCOUNTER — Ambulatory Visit (INDEPENDENT_AMBULATORY_CARE_PROVIDER_SITE_OTHER): Payer: 59 | Admitting: Podiatry

## 2015-12-02 ENCOUNTER — Encounter: Payer: Self-pay | Admitting: Podiatry

## 2015-12-02 VITALS — BP 135/87 | HR 81 | Resp 14

## 2015-12-02 DIAGNOSIS — L600 Ingrowing nail: Secondary | ICD-10-CM

## 2015-12-02 DIAGNOSIS — L84 Corns and callosities: Secondary | ICD-10-CM

## 2015-12-02 NOTE — Patient Instructions (Signed)
Today your exam demonstrated a C-shaped nail that has previously had surgical removal of the margins. The fleshy part of your toe rubs against the toenail resulting in a callused nail groove. Today I trim the nail back and applied a mild acid to the edges of the nail to soften the callus. This treatment could be repeated. Also, the margins could be narrowed further or the entire toenail could be removed  Reappoint at your request

## 2015-12-02 NOTE — Progress Notes (Signed)
   Subjective:    Patient ID: Chelsea Landry, female    DOB: 1955/10/07, 60 y.o.   MRN: [id number]  HPI  This patient presents today complaining of approximately 2 week history of pain along the medial lateral borders of the left hallux toenail area. Patient mentioned this problem briefly to Dr. Paulla Dolly, however at that time was not her primary concern on the visit of 11/19/2015. Patient has a history of surgical resection on the medial lateral borders of this toenail some years ago.  Review of Systems  All other systems reviewed and are negative.      Objective:   Physical Exam  Orientated 3  Vascular: No calf edema calf tenderness or peripheral edema noted bilaterally DP and PT pulses 2/4 bilaterally Capillary reflex immediate bilaterally  Neurological: Sensation to 10 g monofilament wire intact 5/5 bilaterally Vibratory sensation reactive bilaterally Ankle reflex equal and reactive bilaterally  Dermatological: No open skin lesions bilaterally Varus rotated fifth toes with lateral corns, bilaterally The left hallux nail appears narrowed along the medial lateral borders with incurvation of the toenail and associated callused nail grooves Left hallux nail has some mild dystrophic changes within the nail plate  Musculoskeletal: There is rotated fifth toes bilaterally Dorsi flexion, plantar flexion, inversion, eversion 5/5 bilaterally       Assessment & Plan:   Assessment: Left hallux toenail is incurvated with a callused nail grooves, medial lateral borders  Plan: Discuss treatment options including debridement and application of acid versus further removal of the nail margins or total removal of the nail. Patient opts for debridement of the nail nap patient of acid  The left hallux nail was debrided mechanically left without any bleeding Salinocaine applied to the medial lateral nail grooves of the left hallux toenail  Reappoint at patient's request

## 2016-02-02 ENCOUNTER — Encounter (HOSPITAL_COMMUNITY): Payer: Self-pay | Admitting: Emergency Medicine

## 2016-02-02 ENCOUNTER — Emergency Department (HOSPITAL_COMMUNITY): Payer: 59

## 2016-02-02 DIAGNOSIS — E034 Atrophy of thyroid (acquired): Secondary | ICD-10-CM | POA: Diagnosis not present

## 2016-02-02 DIAGNOSIS — R079 Chest pain, unspecified: Principal | ICD-10-CM | POA: Insufficient documentation

## 2016-02-02 DIAGNOSIS — Z79899 Other long term (current) drug therapy: Secondary | ICD-10-CM | POA: Insufficient documentation

## 2016-02-02 DIAGNOSIS — Z9049 Acquired absence of other specified parts of digestive tract: Secondary | ICD-10-CM | POA: Insufficient documentation

## 2016-02-02 DIAGNOSIS — L602 Onychogryphosis: Secondary | ICD-10-CM | POA: Diagnosis not present

## 2016-02-02 DIAGNOSIS — I10 Essential (primary) hypertension: Secondary | ICD-10-CM | POA: Insufficient documentation

## 2016-02-02 DIAGNOSIS — Z806 Family history of leukemia: Secondary | ICD-10-CM | POA: Diagnosis not present

## 2016-02-02 DIAGNOSIS — Z808 Family history of malignant neoplasm of other organs or systems: Secondary | ICD-10-CM | POA: Insufficient documentation

## 2016-02-02 DIAGNOSIS — Z6829 Body mass index (BMI) 29.0-29.9, adult: Secondary | ICD-10-CM | POA: Insufficient documentation

## 2016-02-02 DIAGNOSIS — Z8249 Family history of ischemic heart disease and other diseases of the circulatory system: Secondary | ICD-10-CM | POA: Insufficient documentation

## 2016-02-02 DIAGNOSIS — Z9884 Bariatric surgery status: Secondary | ICD-10-CM | POA: Diagnosis not present

## 2016-02-02 DIAGNOSIS — R0600 Dyspnea, unspecified: Secondary | ICD-10-CM | POA: Diagnosis present

## 2016-02-02 DIAGNOSIS — R072 Precordial pain: Secondary | ICD-10-CM | POA: Diagnosis not present

## 2016-02-02 DIAGNOSIS — Z9889 Other specified postprocedural states: Secondary | ICD-10-CM | POA: Diagnosis not present

## 2016-02-02 DIAGNOSIS — R0602 Shortness of breath: Secondary | ICD-10-CM | POA: Diagnosis not present

## 2016-02-02 LAB — BASIC METABOLIC PANEL
ANION GAP: 6 (ref 5–15)
BUN: 13 mg/dL (ref 6–20)
CHLORIDE: 104 mmol/L (ref 101–111)
CO2: 28 mmol/L (ref 22–32)
Calcium: 9.6 mg/dL (ref 8.9–10.3)
Creatinine, Ser: 1.01 mg/dL — ABNORMAL HIGH (ref 0.44–1.00)
GFR, EST NON AFRICAN AMERICAN: 59 mL/min — AB (ref >60)
Glucose, Bld: 107 mg/dL — ABNORMAL HIGH (ref 65–99)
POTASSIUM: 3.9 mmol/L (ref 3.5–5.1)
SODIUM: 138 mmol/L (ref 135–145)

## 2016-02-02 LAB — CBC
HEMATOCRIT: 42.1 % (ref 36.0–46.0)
HEMOGLOBIN: 14.1 g/dL (ref 12.0–15.0)
MCH: 29.5 pg (ref 26.0–34.0)
MCHC: 33.5 g/dL (ref 30.0–36.0)
MCV: 88.1 fL (ref 78.0–100.0)
Platelets: 240 10*3/uL (ref 150–400)
RBC: 4.78 MIL/uL (ref 3.87–5.11)
RDW: 13 % (ref 11.5–15.5)
WBC: 8 10*3/uL (ref 4.0–10.5)

## 2016-02-02 LAB — I-STAT TROPONIN, ED: Troponin i, poc: 0 ng/mL (ref 0.00–0.08)

## 2016-02-02 NOTE — ED Triage Notes (Signed)
Patient reports intermittent central chest pain " heaviness" with SOB onset last week , denies nausea or diaphoresis .

## 2016-02-03 ENCOUNTER — Encounter: Payer: Self-pay | Admitting: Family Medicine

## 2016-02-03 ENCOUNTER — Observation Stay (HOSPITAL_COMMUNITY): Payer: 59

## 2016-02-03 ENCOUNTER — Observation Stay (HOSPITAL_COMMUNITY)
Admission: EM | Admit: 2016-02-03 | Discharge: 2016-02-03 | Disposition: A | Discharge status: Home / Self Care | Payer: 59 | Attending: Family Medicine | Admitting: Family Medicine

## 2016-02-03 DIAGNOSIS — I361 Nonrheumatic tricuspid (valve) insufficiency: Secondary | ICD-10-CM | POA: Diagnosis not present

## 2016-02-03 DIAGNOSIS — I10 Essential (primary) hypertension: Secondary | ICD-10-CM | POA: Diagnosis not present

## 2016-02-03 DIAGNOSIS — E034 Atrophy of thyroid (acquired): Secondary | ICD-10-CM | POA: Diagnosis not present

## 2016-02-03 DIAGNOSIS — R079 Chest pain, unspecified: Secondary | ICD-10-CM | POA: Diagnosis not present

## 2016-02-03 DIAGNOSIS — R072 Precordial pain: Secondary | ICD-10-CM | POA: Diagnosis not present

## 2016-02-03 LAB — TROPONIN I: Troponin I: <0.03 ng/mL (ref <0.03)

## 2016-02-03 LAB — ECHOCARDIOGRAM COMPLETE

## 2016-02-03 MED ORDER — ONDANSETRON HCL 4 MG/2ML IJ SOLN: 4.0000 mg | Freq: Four times a day (QID) | INTRAMUSCULAR | Status: DC | PRN

## 2016-02-03 MED ORDER — METOPROLOL SUCCINATE ER 25 MG PO TB24: 25.0000 mg | ORAL_TABLET | Freq: Every day | ORAL | 3 refills | Status: DC

## 2016-02-03 MED ORDER — AMLODIPINE BESYLATE 2.5 MG PO TABS
2.5000 mg | ORAL_TABLET | Freq: Every day | ORAL | 3 refills | Status: DC
Start: 2016-02-03 — End: 2019-10-11

## 2016-02-03 MED ORDER — ACETAMINOPHEN 325 MG PO TABS: 650.0000 mg | ORAL_TABLET | ORAL | Status: DC | PRN

## 2016-02-03 MED ORDER — HEPARIN SODIUM (PORCINE) 5000 UNIT/ML IJ SOLN
5000.0000 [IU] | Freq: Three times a day (TID) | INTRAMUSCULAR | Status: DC
  Administered 2016-02-03: 5000 [IU] via SUBCUTANEOUS
  Filled 2016-02-03: qty 1

## 2016-02-03 MED ORDER — METOPROLOL SUCCINATE ER 25 MG PO TB24
25.0000 mg | ORAL_TABLET | Freq: Every day | ORAL | Status: DC
  Filled 2016-02-03: qty 1

## 2016-02-03 MED ORDER — NITROGLYCERIN 0.4 MG SL SUBL: 0.4000 mg | SUBLINGUAL_TABLET | SUBLINGUAL | 1 refills | Status: DC | PRN

## 2016-02-03 MED ORDER — NITROGLYCERIN 0.4 MG SL SUBL: 0.4000 mg | SUBLINGUAL_TABLET | SUBLINGUAL | Status: DC | PRN

## 2016-02-03 MED ORDER — AMLODIPINE BESYLATE 5 MG PO TABS: 2.5000 mg | ORAL_TABLET | Freq: Every day | ORAL | Status: DC

## 2016-02-03 NOTE — Progress Notes (Signed)
  Echocardiogram 2D Echocardiogram has been performed.  Donata Clay 02/03/2016, 10:23 AM

## 2016-02-03 NOTE — Consult Note (Signed)
Referring Physician: Dr. Verneita Griffes  Chelsea Landry is an 61 y.o. female.                       Chief Complaint: Chest pain  HPI: 60 year old female with long standing history of hypertension has one week history of intermittent chest pain, retrosternal, non-radiating but with shortness of breath. No abdominal pain or nausea or vomiting. No fever or cough. Currently chest pain free. Troponin-I negative. EKG shows chronic inferior wall ischemic change. Echocardiogram showed normal LV systolic function with mild diastolic dysfunction.  Past Medical History:  Diagnosis Date  . Fibroids   . Frequency of urination   . Hypertension   . Rash    RT LEG  . SVD (spontaneous vaginal delivery)    x 2  . Vaginal lesion       Past Surgical History:  Procedure Laterality Date  . BREATH TEK H PYLORI N/A 04/17/2013   Procedure: BREATH TEK H PYLORI;  Surgeon: Pedro Earls, MD;  Location: [facility];  Service: General;  Laterality: N/A;  . CHOLECYSTECTOMY  2001  . COLONOSCOPY  2006  . DILATATION & CURETTAGE/HYSTEROSCOPY WITH MYOSURE N/A 12/27/2013   Procedure: DILATATION & CURETTAGE/HYSTEROSCOPY WITH MYOSURE;  Surgeon: Crawford Givens, MD;  Location: [facility];  Service: Gynecology;  Laterality: N/A;  . HYSTEROSCOPY    . LAPAROSCOPIC GASTRIC SLEEVE RESECTION N/A 12/31/2013   Procedure: LAPAROSCOPIC GASTRIC SLEEVE RESECTION w/Upper GI/gen and hiatal hernia repair;  Surgeon: Pedro Earls, MD;  Location: [facility];  Service: General;  Laterality: N/A;  . 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 Leukemia Mother   . Aneurysm Father   . Cancer Brother     brain  . Hypertension Other    Social History:  reports that she has never smoked. She has never used smokeless tobacco. She reports that she does not drink alcohol or use drugs.  Allergies: No Known Allergies   (Not in a hospital admission)  Results for orders placed or performed during the  hospital encounter of 02/03/16 (from the past 48 hour(s))  Basic metabolic panel     Status: Abnormal   Collection Time: 02/02/16 10:35 PM  Result Value Ref Range   Sodium 138 135 - 145 mmol/L   Potassium 3.9 3.5 - 5.1 mmol/L    Comment: SLIGHT HEMOLYSIS   Chloride 104 101 - 111 mmol/L   CO2 28 22 - 32 mmol/L   Glucose, Bld 107 (H) 65 - 99 mg/dL   BUN 13 6 - 20 mg/dL   Creatinine, Ser 1.01 (H) 0.44 - 1.00 mg/dL   Calcium 9.6 8.9 - 10.3 mg/dL   GFR calc non Af Amer 59 (L) >60 mL/min   GFR calc Af Amer >60 >60 mL/min    Comment: (NOTE) The eGFR has been calculated using the CKD EPI equation. This calculation has not been validated in all clinical situations. eGFR's persistently <60 mL/min signify possible Chronic Kidney Disease.    Anion gap 6 5 - 15  CBC     Status: None   Collection Time: 02/02/16 10:35 PM  Result Value Ref Range   WBC 8.0 4.0 - 10.5 K/uL   RBC 4.78 3.87 - 5.11 MIL/uL   Hemoglobin 14.1 12.0 - 15.0 g/dL   HCT 42.1 36.0 - 46.0 %   MCV 88.1 78.0 - 100.0 fL   MCH 29.5  26.0 - 34.0 pg   MCHC 33.5 30.0 - 36.0 g/dL   RDW 13.0 11.5 - 15.5 %   Platelets 240 150 - 400 K/uL  I-stat troponin, ED     Status: None   Collection Time: 02/02/16 10:50 PM  Result Value Ref Range   Troponin i, poc 0.00 0.00 - 0.08 ng/mL   Comment 3            Comment: Due to the release kinetics of cTnI, a negative result within the first hours of the onset of symptoms does not rule out myocardial infarction with certainty. If myocardial infarction is still suspected, repeat the test at appropriate intervals.   Troponin I-serum (0, 3, 6 hours)     Status: None   Collection Time: 02/03/16  5:43 AM  Result Value Ref Range   Troponin I <0.03 <0.03 ng/mL  Troponin I-serum (0, 3, 6 hours)     Status: None   Collection Time: 02/03/16  8:32 AM  Result Value Ref Range   Troponin I <0.03 <0.03 ng/mL  Troponin I-serum (0, 3, 6 hours)     Status: None   Collection Time: 02/03/16 11:06 AM   Result Value Ref Range   Troponin I <0.03 <0.03 ng/mL   Dg Chest 2 View  Result Date: 02/02/2016 CLINICAL DATA:  Acute onset of intermittent generalized chest pain and shortness of breath. Initial encounter. EXAM: CHEST  2 VIEW COMPARISON:  Chest radiograph performed 12/19/2013 FINDINGS: The lungs are well-aerated and clear. There is no evidence of focal opacification, pleural effusion or pneumothorax. The heart is normal in size; the mediastinal contour is within normal limits. No acute osseous abnormalities are seen. Clips are noted within the right upper quadrant, reflecting prior cholecystectomy. IMPRESSION: No acute cardiopulmonary process seen. Electronically Signed   By: Garald Balding M.D.   On: 02/02/2016 23:12    Review Of Systems Constitutional: No fever, chills , weight loss or gain. Eyes: No vision change, No discharge or pain.. Ears: No hearing loss, No tinnitus. Respiratory: No asthma, COPD, pneumonias. Positive shortness of breath. No hemoptysis. Cardiovascular: Positive chest pain, no palpitation or leg edema. Gastrointestinal: No nausea, vomiting or diarrhea or constipation. No GI bleed. No hepatitis. Positive hiatal hernia. Genitourinary: No dysuria, hematuria or kidney stone. No incontinance. Neurological: No headache, stroke or seizures.  Psychiatry: No psych facility admission for anxiety, depression or suicide. No detox. Skin: Right leg rash. Musculoskeletal: No joint pain or fibromyalgia. No neck pain or back pain. Lymphadenopathy: No lymphadenopathy. Hematology: No anemia or easy bruising.   Blood pressure 135/92, pulse 67, temperature 97.9 F (36.6 C), temperature source Oral, resp. rate 14, height '5\' 2"'  (1.575 m), weight 72.6 kg (160 lb), SpO2 100 %. Body mass index is 29.26 kg/m. General appearance: alert, cooperative, appears stated age and no distress. Head: Normocephalic, atraumatic. Eyes: Brown eyes, pink conjunctivae/corneas clear. PERRL, EOM's  intact. Neck: No adenopathy, no carotid bruit, no JVD, supple, symmetrical, trachea midline and thyroid not enlarged, symmetric, no tenderness/mass/nodules Resp: Clear to auscultation bilaterally Cardio: Regular rate and rhythm, S1, S2 normal, II/VI systolic murmur, no click, rub or gallop GI: soft, non-tender; bowel sounds normal; no masses,  no organomegaly Extremities: extremities normal, atraumatic, no cyanosis or edema Skin: Warm and dry. No rashes or lesions Neurologic: Alert and oriented X 3, normal strength and tone. Normal coordination.  Assessment/Plan Chest pain Hypertension S/P gastric sleeve resection Mild tricuspid valve regurgitation Mild diastolic dysfunction  Add amlodipine 2.5 mg. daily for  hypertension and angina.  Add Toprol XL 25 mg. one daily for diastolic dysfunction. Add SL NTG as needed for chest pain. OP TMST per patient request.   Birdie Riddle, MD  02/03/2016, 12:42 PM

## 2016-02-03 NOTE — ED Notes (Signed)
Doctor at bedside.

## 2016-02-03 NOTE — ED Provider Notes (Signed)
[facility] DEPT Provider Note   CSN: [id number] Arrival date & time: 02/02/16  2218     History   Chief Complaint Chief Complaint  Patient presents with  . Chest Pain    HPI Chelsea Landry is a 61 y.o. female.  The history is provided by the patient.  Chest Pain   This is a new problem. The current episode started 12 to 24 hours ago. The problem occurs constantly. The problem has been resolved. The pain is present in the substernal region. The pain is moderate. The quality of the pain is described as heavy. The pain does not radiate. Duration of episode(s) is 9 hours. Associated symptoms include shortness of breath. Pertinent negatives include no abdominal pain, no diaphoresis and no vomiting. She has tried nothing for the symptoms.  Her past medical history is significant for hyperlipidemia and hypertension.  pt reports she has had intermittent episodes of CP for past several weeks While at work yesterday she had onset of chest heaviness that lasted up to 9 hours.  This is the longest episode as of yet as previous ones have resolved in much shorter time. Denies known h/o CAD/CVA She is now pain free   Past Medical History:  Diagnosis Date  . Fibroids   . Frequency of urination   . Hypertension   . Rash    RT LEG  . SVD (spontaneous vaginal delivery)    x 2  . Vaginal lesion     Patient Active Problem List   Diagnosis Date Noted  . Chest pain, rule out acute myocardial infarction 02/03/2016  . S/P laparoscopic sleeve gastrectomy Dec 2015 12/31/2013  . Morbid obesity ([place]) 02/28/2013  . Lipoma of arm - both forearms - 4 cm, subcutaneous 12/28/2010  . Lipoma of back - 3 cm 12/28/2010    Past Surgical History:  Procedure Laterality Date  . BREATH TEK H PYLORI N/A 04/17/2013   Procedure: BREATH TEK H PYLORI;  Surgeon: Pedro Earls, MD;  Location: [facility];  Service: General;  Laterality: N/A;  . CHOLECYSTECTOMY  2001  . COLONOSCOPY  2006  . DILATATION &  CURETTAGE/HYSTEROSCOPY WITH MYOSURE N/A 12/27/2013   Procedure: DILATATION & CURETTAGE/HYSTEROSCOPY WITH MYOSURE;  Surgeon: Crawford Givens, MD;  Location: [facility];  Service: Gynecology;  Laterality: N/A;  . HYSTEROSCOPY    . LAPAROSCOPIC GASTRIC SLEEVE RESECTION N/A 12/31/2013   Procedure: LAPAROSCOPIC GASTRIC SLEEVE RESECTION w/Upper GI/gen and hiatal hernia repair;  Surgeon: Pedro Earls, MD;  Location: [facility];  Service: General;  Laterality: N/A;  . 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 Authorizing Provider  Calcium-Magnesium-Vitamin D (CALCIUM 500 PO) Take 1 tablet by mouth 3 (three) times daily.   Yes Historical Provider, MD  losartan (COZAAR) 50 MG tablet Take 50 mg by mouth daily. 03/01/14  Yes Historical Provider, MD  omega-3 acid ethyl esters (LOVAZA) 1 g capsule Take 1 g by mouth daily.   Yes Historical Provider, MD  Pediatric Multivit-Minerals-C (FLINTSTONES COMPLETE PO) Take 1 tablet by mouth daily.   Yes Historical Provider, MD  phentermine 37.5 MG  capsule Take 37.5 mg by mouth every morning.   Yes Historical Provider, MD    Family History Family History  Problem Relation Age of Onset  . Leukemia Mother   . Aneurysm Father   . Cancer Brother     brain  . Hypertension Other     Social History Social History  Substance Use Topics  . Smoking status: Never Smoker  . Smokeless tobacco: Never Used  . Alcohol use No     Allergies   Patient has no known allergies.   Review of Systems Review of Systems  Constitutional: Negative for diaphoresis.  Respiratory: Positive for shortness of breath.   Cardiovascular: Positive for chest pain. Negative for leg swelling.  Gastrointestinal: Negative for abdominal pain and vomiting.  All other systems  reviewed and are negative.    Physical Exam Updated Vital Signs BP 135/75 (BP Location: Right Arm)   Pulse 71   Temp 97.5 F (36.4 C) (Oral)   Resp 14   Ht 5\' 2"  (1.575 m)   Wt 72.6 kg   LMP  (Within Years)   SpO2 99%   BMI 29.26 kg/m   Physical Exam CONSTITUTIONAL: Well developed/well nourished HEAD: Normocephalic/atraumatic EYES: EOMI/PERRL ENMT: Mucous membranes moist NECK: supple no meningeal signs SPINE/BACK:entire spine nontender CV: S1/S2 noted, no murmurs/rubs/gallops noted LUNGS: Lungs are clear to auscultation bilaterally, no apparent distress ABDOMEN: soft, nontender GU:no cva tenderness NEURO: Pt is awake/alert/appropriate, moves all extremitiesx4.  No facial droop.   EXTREMITIES: pulses normal/equal, full ROM SKIN: warm, color normal PSYCH: no abnormalities of mood noted, alert and oriented to situation   ED Treatments / Results  Labs (all labs ordered are listed, but only abnormal results are displayed) Labs Reviewed  BASIC METABOLIC PANEL - Abnormal; Notable for the following:       Result Value   Glucose, Bld 107 (*)    Creatinine, Ser 1.01 (*)    GFR calc non Af Amer 59 (*)    All other components within normal limits  CBC  I-STAT TROPOININ, ED    EKG  EKG Interpretation  Date/Time:  Monday February 02 2016 22:22:18 EST Ventricular Rate:  83 PR Interval:  144 QRS Duration: 72 QT Interval:  366 QTC Calculation: 430 R Axis:   86 Text Interpretation:  Normal sinus rhythm Right atrial enlargement T wave abnormality, consider inferior ischemia Abnormal ECG No significant change since last tracing Confirmed by [place] ([id number]) on 02/03/2016 4:13:07 AM       Radiology Dg Chest 2 View  Result Date: 02/02/2016 CLINICAL DATA:  Acute onset of intermittent generalized chest pain and shortness of breath. Initial encounter. EXAM: CHEST  2 VIEW COMPARISON:  Chest radiograph performed 12/19/2013 FINDINGS: The lungs are well-aerated and  clear. There is no evidence of focal opacification, pleural effusion or pneumothorax. The heart is normal in size; the mediastinal contour is within normal limits. No acute osseous abnormalities are seen. Clips are noted within the right upper quadrant, reflecting prior cholecystectomy. IMPRESSION: No acute cardiopulmonary process seen. Electronically Signed   By: Garald Balding M.D.   On: 02/02/2016 23:12    Procedures Procedures (including critical care time)  Medications Ordered in ED Medications - No data to display   Initial Impression / Assessment and Plan / ED Course  I have reviewed the triage vital signs and the nursing notes.  Pertinent labs & imaging results that were available during my care of the patient were reviewed by me and considered  in my medical decision making (see chart for details).  Clinical Course     4:56 AM Pt here for increasing episodes of chest heaviness and SOB She is now CP free Due to age, history, risk factors, HEART score = 4 I recommended admit Pt agreeable Advised to take ASA, but she declines due to prior gastric surgery D/w dr gardner for admit Pt stable, I doubt PE/Dissection at this time   Final Clinical Impressions(s) / ED Diagnoses   Final diagnoses:  Chest pain, rule out acute myocardial infarction    New Prescriptions New Prescriptions   No medications on file     Ripley Fraise, MD 02/03/16 [phone]

## 2016-02-03 NOTE — Discharge Summary (Signed)
Physician Discharge Summary  Chelsea Landry [id number] DOB: 1955/05/10 DOA: 02/03/2016  PCP: Gennette Pac, MD  Admit date: 02/03/2016 Discharge date: 02/03/2016  Time spent: 35 minutes  Recommendations for Outpatient Follow-up:  1. For OP TMST with Dr. Doylene Canard 2. New meds metoprolol 25 xl and amlodpine 2.5 daily  Discharge Diagnoses:  Active Problems:   Chest pain, rule out acute myocardial infarction   Discharge Condition: improved  Diet recommendation: hh  Filed Weights   02/02/16 2227  Weight: 72.6 kg (160 lb)    History of present illness:  61 year old female Prior history of morbid obesity status post laparoscopic sleeve gastric to me 12/2013 Bilateral lipoma status post surgery HTN Onychogryphosis He is on phentermine   Admitted with a one-week history of intermittent chest pain with heaviness associated with shortness of breath in the setting of recent changes shifts at work and increased pressure at work to do second shift and mental stress EKG shows S1 every 3 T3 and echocardiogram was ordered patient has been nothing by mouth in case stress as needed Phone and was ruled out acute coronary syndrome echocardiogram done showed chronic diastolic compensated hf  Dr. Doylene Canard saw in consult and agreed to get a stress as OP one her and added meds    Discharge Exam: Vitals:   02/03/16 1145 02/03/16 1230  BP: 110/89 135/92  Pulse: 68 67  Resp: 15 14  Temp:      General: alert pleasant Cardiovascular: s1 s 2no m/r/g Respiratory: clear no added sound Alert pleasant in no distress  Discharge Instructions   Discharge Instructions    Diet - low sodium heart healthy    Complete by:  As directed    Diet - low sodium heart healthy    Complete by:  As directed    Discharge instructions    Complete by:  As directed    Follow Dr. Doylene Canard as OP Take new meds as directed   Increase activity slowly    Complete by:  As directed    Increase activity slowly     Complete by:  As directed      Current Discharge Medication List    START taking these medications   Details  amLODipine (NORVASC) 2.5 MG tablet Take 1 tablet (2.5 mg total) by mouth daily. Qty: 30 tablet, Refills: 3    metoprolol succinate (TOPROL-XL) 25 MG 24 hr tablet Take 1 tablet (25 mg total) by mouth at bedtime. Qty: 30 tablet, Refills: 3    nitroGLYCERIN (NITROSTAT) 0.4 MG SL tablet Place 1 tablet (0.4 mg total) under the tongue every 5 (five) minutes as needed for chest pain. Qty: 25 tablet, Refills: 1      CONTINUE these medications which have NOT CHANGED   Details  losartan (COZAAR) 50 MG tablet Take 50 mg by mouth daily. Refills: 12    omega-3 acid ethyl esters (LOVAZA) 1 g capsule Take 1 g by mouth daily.    Pediatric Multivit-Minerals-C (FLINTSTONES COMPLETE PO) Take 1 tablet by mouth daily.      STOP taking these medications     Calcium-Magnesium-Vitamin D (CALCIUM 500 PO)      phentermine 37.5 MG capsule        No Known Allergies Follow-up Information    KADAKIA,AJAY S, MD. Schedule an appointment as soon as possible for a visit in 1 week(s).   Specialty:  Cardiology Why:  Hold Metoprolol on day of TMST. Skip breakfast if test in AM and skip lunch if test  in afternoon. Contact information: [address] [place] [address] [phone]        Gennette Pac, MD. Schedule an appointment as soon as possible for a visit in 1 month(s).   Specialty:  Family Medicine Contact information: [place] [address] [phone]            The results of significant diagnostics from this hospitalization (including imaging, microbiology, ancillary and laboratory) are listed below for reference.    Significant Diagnostic Studies: Dg Chest 2 View  Result Date: 02/02/2016 CLINICAL DATA:  Acute onset of intermittent generalized chest pain and shortness of breath. Initial encounter. EXAM: CHEST  2 VIEW COMPARISON:   Chest radiograph performed 12/19/2013 FINDINGS: The lungs are well-aerated and clear. There is no evidence of focal opacification, pleural effusion or pneumothorax. The heart is normal in size; the mediastinal contour is within normal limits. No acute osseous abnormalities are seen. Clips are noted within the right upper quadrant, reflecting prior cholecystectomy. IMPRESSION: No acute cardiopulmonary process seen. Electronically Signed   By: Garald Balding M.D.   On: 02/02/2016 23:12    Microbiology: No results found for this or any previous visit (from the past 240 hour(s)).   Labs: Basic Metabolic Panel:  Recent Labs Lab 02/02/16 2235  NA 138  K 3.9  CL 104  CO2 28  GLUCOSE 107*  BUN 13  CREATININE 1.01*  CALCIUM 9.6   Liver Function Tests: No results for input(s): AST, ALT, ALKPHOS, BILITOT, PROT, ALBUMIN in the last 168 hours. No results for input(s): LIPASE, AMYLASE in the last 168 hours. No results for input(s): AMMONIA in the last 168 hours. CBC:  Recent Labs Lab 02/02/16 2235  WBC 8.0  HGB 14.1  HCT 42.1  MCV 88.1  PLT 240   Cardiac Enzymes:  Recent Labs Lab 02/03/16 0543 02/03/16 0832 02/03/16 1106  TROPONINI <0.03 <0.03 <0.03   BNP: BNP (last 3 results) No results for input(s): BNP in the last 8760 hours.  ProBNP (last 3 results) No results for input(s): PROBNP in the last 8760 hours.  CBG: No results for input(s): GLUCAP in the last 168 hours.     SignedNita Sells MD   [facility] 02/03/2016, 1:33 PM

## 2016-02-03 NOTE — ED Notes (Signed)
Pt ambulates to the bathroom with normal gait and steady on feet

## 2016-02-03 NOTE — ED Notes (Signed)
Nurse drawing labs. 

## 2016-02-03 NOTE — ED Notes (Signed)
Cardiology at bedside.

## 2016-02-03 NOTE — ED Notes (Signed)
EKG completed given Dr Verlon Au.

## 2016-02-03 NOTE — H&P (Signed)
History and Physical    Chelsea Landry [id number] DOB: Jul 17, 1955 DOA: 02/03/2016   PCP: Gennette Pac, MD Chief Complaint:  Chief Complaint  Patient presents with  . Chest Pain    HPI: Chelsea Landry is a 61 y.o. female with medical history significant of HTN.  Patient presents to the ED with c/o 1 week history of intermittent chest pain.  Pain located in L chest, quality "heaviness", associated with SOB.  Yesterday she had 9 hours of CP.  Tried nothing for symptoms.  No abd pain, N/V.    ED Course: Currently Pain free.  Review of Systems: As per HPI otherwise 10 point review of systems negative.    Past Medical History:  Diagnosis Date  . Fibroids   . Frequency of urination   . Hypertension   . Rash    RT LEG  . SVD (spontaneous vaginal delivery)    x 2  . Vaginal lesion     Past Surgical History:  Procedure Laterality Date  . BREATH TEK H PYLORI N/A 04/17/2013   Procedure: BREATH TEK H PYLORI;  Surgeon: Pedro Earls, MD;  Location: [facility];  Service: General;  Laterality: N/A;  . CHOLECYSTECTOMY  2001  . COLONOSCOPY  2006  . DILATATION & CURETTAGE/HYSTEROSCOPY WITH MYOSURE N/A 12/27/2013   Procedure: DILATATION & CURETTAGE/HYSTEROSCOPY WITH MYOSURE;  Surgeon: Crawford Givens, MD;  Location: [facility];  Service: Gynecology;  Laterality: N/A;  . HYSTEROSCOPY    . LAPAROSCOPIC GASTRIC SLEEVE RESECTION N/A 12/31/2013   Procedure: LAPAROSCOPIC GASTRIC SLEEVE RESECTION w/Upper GI/gen and hiatal hernia repair;  Surgeon: Pedro Earls, MD;  Location: [facility];  Service: General;  Laterality: N/A;  . 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 She has never used smokeless tobacco. She reports that she does not drink alcohol or use drugs.  No Known Allergies  Family History  Problem Relation Age of Onset  . Leukemia Mother   . Aneurysm Father   . Cancer Brother     brain  . Hypertension Other       Prior to Admission  medications   Medication Sig Start Date End Date Taking? Authorizing Provider  Calcium-Magnesium-Vitamin D (CALCIUM 500 PO) Take 1 tablet by mouth 3 (three) times daily.   Yes Historical Provider, MD  losartan (COZAAR) 50 MG tablet Take 50 mg by mouth daily. 03/01/14  Yes Historical Provider, MD  omega-3 acid ethyl esters (LOVAZA) 1 g capsule Take 1 g by mouth daily.   Yes Historical Provider, MD  Pediatric Multivit-Minerals-C (FLINTSTONES COMPLETE PO) Take 1 tablet by mouth daily.   Yes Historical Provider, MD  phentermine 37.5 MG capsule Take 37.5 mg by mouth every morning.   Yes Historical Provider, MD    Physical Exam: Vitals:   02/03/16 0130 02/03/16 0430 02/03/16 0445 02/03/16 0500  BP: 135/75 141/86 152/92 153/84  Pulse: 71 68 (!) 50 64  Resp: 14 19 12 16   Temp: 97.5 F (36.4 C)     TempSrc: Oral     SpO2: 99% 100% 99% 100%  Weight:      Height:          Constitutional: NAD, calm, comfortable Eyes: PERRL, lids and conjunctivae normal ENMT: Mucous membranes are moist. Posterior pharynx clear of any exudate or lesions.Normal dentition.  Neck: normal, supple, no masses, no thyromegaly Respiratory: clear to auscultation bilaterally, no wheezing, no crackles. Normal respiratory effort. No accessory muscle  use.  Cardiovascular: Regular rate and rhythm, no murmurs / rubs / gallops. No extremity edema. 2+ pedal pulses. No carotid bruits.  Abdomen: no tenderness, no masses palpated. No hepatosplenomegaly. Bowel sounds positive.  Musculoskeletal: no clubbing / cyanosis. No joint deformity upper and lower extremities. Good ROM, no contractures. Normal muscle tone.  Skin: no rashes, lesions, ulcers. No induration Neurologic: CN 2-12 grossly intact. Sensation intact, DTR normal. Strength 5/5 in all 4.  Psychiatric: Normal judgment and insight. Alert and oriented x 3. Normal mood.    Labs on Admission: I have personally reviewed following labs and imaging studies  CBC:  Recent  Labs Lab 02/02/16 2235  WBC 8.0  HGB 14.1  HCT 42.1  MCV 88.1  PLT [id number]   Basic Metabolic Panel:  Recent Labs Lab 02/02/16 2235  NA 138  K 3.9  CL 104  CO2 28  GLUCOSE 107*  BUN 13  CREATININE 1.01*  CALCIUM 9.6   GFR: Estimated Creatinine Clearance: 54.6 mL/min (by C-G formula based on SCr of 1.01 mg/dL (H)). Liver Function Tests: No results for input(s): AST, ALT, ALKPHOS, BILITOT, PROT, ALBUMIN in the last 168 hours. No results for input(s): LIPASE, AMYLASE in the last 168 hours. No results for input(s): AMMONIA in the last 168 hours. Coagulation Profile: No results for input(s): INR, PROTIME in the last 168 hours. Cardiac Enzymes: No results for input(s): CKTOTAL, CKMB, CKMBINDEX, TROPONINI in the last 168 hours. BNP (last 3 results) No results for input(s): PROBNP in the last 8760 hours. HbA1C: No results for input(s): HGBA1C in the last 72 hours. CBG: No results for input(s): GLUCAP in the last 168 hours. Lipid Profile: No results for input(s): CHOL, HDL, LDLCALC, TRIG, CHOLHDL, LDLDIRECT in the last 72 hours. Thyroid Function Tests: No results for input(s): TSH, T4TOTAL, FREET4, T3FREE, THYROIDAB in the last 72 hours. Anemia Panel: No results for input(s): VITAMINB12, FOLATE, FERRITIN, TIBC, IRON, RETICCTPCT in the last 72 hours. Urine analysis: No results found for: COLORURINE, APPEARANCEUR, LABSPEC, PHURINE, GLUCOSEU, HGBUR, BILIRUBINUR, KETONESUR, PROTEINUR, UROBILINOGEN, NITRITE, LEUKOCYTESUR Sepsis Labs: @LABRCNTIP (procalcitonin:4,lacticidven:4) )No results found for this or any previous visit (from the past 240 hour(s)).   Radiological Exams on Admission: Dg Chest 2 View  Result Date: 02/02/2016 CLINICAL DATA:  Acute onset of intermittent generalized chest pain and shortness of breath. Initial encounter. EXAM: CHEST  2 VIEW COMPARISON:  Chest radiograph performed 12/19/2013 FINDINGS: The lungs are well-aerated and clear. There is no evidence of focal  opacification, pleural effusion or pneumothorax. The heart is normal in size; the mediastinal contour is within normal limits. No acute osseous abnormalities are seen. Clips are noted within the right upper quadrant, reflecting prior cholecystectomy. IMPRESSION: No acute cardiopulmonary process seen. Electronically Signed   By: Garald Balding M.D.   On: 02/02/2016 23:12    EKG: Independently reviewed.  Assessment/Plan Active Problems:   Chest pain, rule out acute myocardial infarction    1. CP R/O - 1. EKG shows S1Q3T3, but this appears old and was present in 2015. 2. Findings suspicious for RVH 3. Getting 2d echo 4. CP obs pathway 5. Serial trops 6. Tele monitor 7. NPO in case stress test desired   DVT prophylaxis: Lovenox Code Status: Full Family Communication: Husband at bedside Consults called: None Admission status: Place in [place], [facility] Pager [phone] from 7PM-7AM  If 7AM-7PM, please contact the day physician for the patient [email or web address] Password [email or web address]  02/03/2016, 5:10 AM

## 2016-02-09 ENCOUNTER — Ambulatory Visit: Payer: 59 | Admitting: Dietician

## 2016-02-16 DIAGNOSIS — I10 Essential (primary) hypertension: Secondary | ICD-10-CM | POA: Diagnosis not present

## 2016-02-16 DIAGNOSIS — R072 Precordial pain: Secondary | ICD-10-CM | POA: Diagnosis not present

## 2016-02-16 DIAGNOSIS — E034 Atrophy of thyroid (acquired): Secondary | ICD-10-CM | POA: Diagnosis not present

## 2016-02-18 DIAGNOSIS — R072 Precordial pain: Secondary | ICD-10-CM | POA: Diagnosis not present

## 2016-02-22 ENCOUNTER — Ambulatory Visit (HOSPITAL_COMMUNITY)
Admission: EM | Admit: 2016-02-22 | Discharge: 2016-02-22 | Disposition: A | Discharge status: Home / Self Care | Payer: 59 | Attending: Family Medicine | Admitting: Family Medicine

## 2016-02-22 ENCOUNTER — Ambulatory Visit (INDEPENDENT_AMBULATORY_CARE_PROVIDER_SITE_OTHER): Payer: 59

## 2016-02-22 ENCOUNTER — Encounter (HOSPITAL_COMMUNITY): Payer: Self-pay

## 2016-02-22 DIAGNOSIS — R350 Frequency of micturition: Secondary | ICD-10-CM | POA: Diagnosis not present

## 2016-02-22 DIAGNOSIS — R091 Pleurisy: Secondary | ICD-10-CM

## 2016-02-22 DIAGNOSIS — R079 Chest pain, unspecified: Secondary | ICD-10-CM | POA: Diagnosis not present

## 2016-02-22 DIAGNOSIS — Z79899 Other long term (current) drug therapy: Secondary | ICD-10-CM | POA: Insufficient documentation

## 2016-02-22 LAB — POCT URINALYSIS DIP (DEVICE)
Bilirubin Urine: NEGATIVE
GLUCOSE, UA: NEGATIVE mg/dL
KETONES UR: NEGATIVE mg/dL
Nitrite: NEGATIVE
PROTEIN: NEGATIVE mg/dL
Specific Gravity, Urine: 1.02 (ref 1.005–1.030)
UROBILINOGEN UA: 0.2 mg/dL (ref 0.0–1.0)
pH: 5.5 (ref 5.0–8.0)

## 2016-02-22 MED ORDER — NAPROXEN 500 MG PO TABS: 500.0000 mg | ORAL_TABLET | Freq: Two times a day (BID) | ORAL | 0 refills | Status: DC

## 2016-02-22 NOTE — ED Provider Notes (Signed)
CSN: [id number]     Arrival date & time 02/22/16  1204 History   First MD Initiated Contact with Patient 02/22/16 1238     Chief Complaint  Patient presents with  . Breast Pain   (Consider location/radiation/quality/duration/timing/severity/associated sxs/prior Treatment) Patient c/o right chest pain with inspiration.  She states she is also having some urinary frequency.  She states she has the same sx's she had when she was dx'd with right pneumonia in the past.  She is seeing a cardiologist for chest pain and has recently had a stress test which was normal.  She states this pain is different and on the right side under her breast and is with inspiration.   The history is provided by the patient.  Chest Pain  Pain location:  R chest Pain quality: dull   Pain radiates to:  Does not radiate Pain severity:  Mild Onset quality:  Sudden Duration:  2 days Timing:  Constant Progression:  Unchanged Chronicity:  New Context: breathing   Relieved by:  None tried Worsened by:  Nothing Ineffective treatments:  None tried   Past Medical History:  Diagnosis Date  . Fibroids   . Frequency of urination   . Hypertension   . Rash    RT LEG  . SVD (spontaneous vaginal delivery)    x 2  . Vaginal lesion    Past Surgical History:  Procedure Laterality Date  . BREATH TEK H PYLORI N/A 04/17/2013   Procedure: BREATH TEK H PYLORI;  Surgeon: Pedro Earls, MD;  Location: [facility];  Service: General;  Laterality: N/A;  . CHOLECYSTECTOMY  2001  . COLONOSCOPY  2006  . DILATATION & CURETTAGE/HYSTEROSCOPY WITH MYOSURE N/A 12/27/2013   Procedure: DILATATION & CURETTAGE/HYSTEROSCOPY WITH MYOSURE;  Surgeon: Crawford Givens, MD;  Location: [facility];  Service: Gynecology;  Laterality: N/A;  . HYSTEROSCOPY    . LAPAROSCOPIC GASTRIC SLEEVE RESECTION N/A 12/31/2013   Procedure: LAPAROSCOPIC GASTRIC SLEEVE RESECTION w/Upper GI/gen and hiatal hernia repair;  Surgeon: Pedro Earls, MD;  Location: [facility];  Service: General;  Laterality: N/A;  . 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 Leukemia Mother   . Aneurysm Father   . Cancer Brother     brain  . Hypertension Other    Social History  Substance Use Topics  . Smoking status: Never Smoker  . Smokeless tobacco: Never Used  . Alcohol use No   OB History    Gravida Para Term Preterm AB Living   2 2       2    SAB TAB Ectopic Multiple Live Births                 Review of Systems  Constitutional: Negative.   HENT: Negative.   Eyes: Negative.   Respiratory: Negative.   Cardiovascular: Positive for chest pain.  Gastrointestinal: Negative.   Endocrine: Negative.   Genitourinary: Negative.   Musculoskeletal: Negative.   Skin: Negative.   Allergic/Immunologic: Negative.   Neurological: Negative.   Hematological: Negative.   Psychiatric/Behavioral: Negative.     Allergies  Patient has no known allergies.  Home Medications   Prior to Admission medications   Medication Sig Start Date End Date Taking? Authorizing Provider  amLODipine (NORVASC) 2.5 MG tablet Take 1 tablet (2.5 mg total) by mouth daily. 02/03/16  Yes Dixie Dials, MD  Calcium-Magnesium-Vitamin D (CALCIUM 500 PO) Take 1 tablet by mouth 3 (  three) times daily.   Yes Historical Provider, MD  losartan (COZAAR) 50 MG tablet Take 50 mg by mouth daily. 03/01/14  Yes Historical Provider, MD  metoprolol succinate (TOPROL-XL) 25 MG 24 hr tablet Take 1 tablet (25 mg total) by mouth at bedtime. 02/03/16  Yes Dixie Dials, MD  nitroGLYCERIN (NITROSTAT) 0.4 MG SL tablet Place 1 tablet (0.4 mg total) under the tongue every 5 (five) minutes as needed for chest pain. 02/03/16  Yes Dixie Dials, MD  omega-3 acid ethyl esters (LOVAZA) 1 g capsule Take 1 g by mouth daily.   Yes Historical Provider, MD  Pediatric Multivit-Minerals-C (FLINTSTONES COMPLETE PO) Take 1 tablet by mouth daily.   Yes Historical Provider, MD  naproxen (NAPROSYN)  500 MG tablet Take 1 tablet (500 mg total) by mouth 2 (two) times daily with a meal. 02/22/16   Lysbeth Penner, FNP  phentermine 37.5 MG capsule Take 37.5 mg by mouth every morning.    Historical Provider, MD   Meds Ordered and Administered this Visit  Medications - No data to display  BP 122/74 (BP Location: Left Arm)   Pulse 83   Temp 97.5 F (36.4 C) (Oral)   Resp 20   SpO2 100%  No data found.   Physical Exam  Constitutional: She appears well-developed and well-nourished.  HENT:  Head: Normocephalic and atraumatic.  Right Ear: External ear normal.  Left Ear: External ear normal.  Mouth/Throat: Oropharynx is clear and moist.  Eyes: Conjunctivae and EOM are normal. Pupils are equal, round, and reactive to light.  Neck: Normal range of motion. Neck supple.  Cardiovascular: Normal rate, regular rhythm and normal heart sounds.   Pulmonary/Chest: Effort normal and breath sounds normal.  Abdominal: Soft.  Nursing note and vitals reviewed.   Urgent Care Course     Procedures (including critical care time)  Labs Review Labs Reviewed  POCT URINALYSIS DIP (DEVICE) - Abnormal; Notable for the following:       Result Value   Hgb urine dipstick TRACE (*)    Leukocytes, UA SMALL (*)    All other components within normal limits  URINE CULTURE    Imaging Review Dg Chest 2 View  Result Date: 02/22/2016 CLINICAL DATA:  61 y/o  F; chest pain over the right breast. EXAM: CHEST  2 VIEW COMPARISON:  02/02/2016 chest radiograph FINDINGS: Stable normal cardiac silhouette. Linear opacities at lung bases are likely atelectasis. No focal consolidation. No pleural effusion. Right upper quadrant cholecystectomy clips. Mild thoracic spine degenerative changes and rightward curvature. IMPRESSION: Minor bibasilar atelectasis.  No focal consolidation Electronically Signed   By: Kristine Garbe M.D.   On: 02/22/2016 13:12     Visual Acuity Review  Right Eye Distance:   Left Eye  Distance:   Bilateral Distance:    Right Eye Near:   Left Eye Near:    Bilateral Near:         MDM   1. Urinary frequency   2. Pleurisy    UA cx Naprosyn 500mg  one po bid x 5 days #10      Lysbeth Penner, FNP 02/22/16 1328

## 2016-02-22 NOTE — ED Triage Notes (Signed)
Pain last night under her right breast and frequent urination which she said was the exact symptoms she had of pneumonia in 2015. Chelsea Landry it is hard to take a deep breath.

## 2016-02-24 LAB — URINE CULTURE

## 2016-02-25 ENCOUNTER — Encounter: Payer: 59 | Attending: Surgery | Admitting: Dietician

## 2016-02-25 ENCOUNTER — Encounter: Payer: Self-pay | Admitting: Dietician

## 2016-02-25 DIAGNOSIS — Z713 Dietary counseling and surveillance: Secondary | ICD-10-CM | POA: Diagnosis present

## 2016-02-25 DIAGNOSIS — Z9884 Bariatric surgery status: Secondary | ICD-10-CM | POA: Diagnosis not present

## 2016-02-25 NOTE — Patient Instructions (Addendum)
Goals:  -Keep up your walking routine!  -Keep Baker Janus as your walking partner  -Set walking dates in your calendar  -Think about signing up for another walk or 5k  -Reduce saturated fat intake  -Try tenderloin instead of ribeye  -Have more fish, chicken, and [place]  -Increase fiber intake: oatmeal, fresh fruit, vegetables  -Have almonds for a snack instead of cheese  -Look into Dr. Theodoro Kos or Irene Limbo

## 2016-02-25 NOTE — Progress Notes (Signed)
  Follow-up visit:  2 Years Post-Operative Gastric sleeve Surgery  Medical Nutrition Therapy:  Appt start time: 930 end time: 1000   Primary concerns today: Post-operative Bariatric Surgery Nutrition Management.  Chelsea Landry returns today for a bariatric nutrition follow up. She has been very busy at work lately; has been working every day, swing shifts including weekends. Has had to leave work due to chest pain. Was diagnosed with pleurisy and is taking Naproxen. Has an appointment with Dr. Hassell Done (bariatric surgeon) today. Was taking phentermine but has stopped for the past week. Declined an updated weight today. Reports high blood sugar, high blood pressure, and high cholesterol.   Surgery date: 12/31/2013 Surgery type: Gastric sleeve Start weight at [facility]: 205.5 lbs Weight today: 163.2 lbs Weight change: 5 lbs gain  Total weight lost: 42 lbs Goal weight: 150 lbs   TANITA  BODY COMP RESULTS  12/24/13 01/15/14 02/25/14 04/08/14 05/20/14 07/03/14 08/20/14 10/02/14 11/20/14 02/21/15 04/24/15 06/05/15 07/24/15 09/22/15 11/18/15   BMI (kg/m^2) 36.5 33.5 31.2 28.7 27.5 27 27.3 27.4 27.3 27.7 28.5 28.8 28.9 29.8 30.1   Fat Mass (lbs) 102 88.0 72.5 63 57.5 51 51 55 56.5 56.5 58.5 58.6 62 70.8 66.2   Fat Free Mass (lbs) 97.5 95.0 98 94 93 96.5 98.5 95 93 95 97.5 98.6 96 92.4 98.4   Total Body Water (lbs) 71.5 69.5 71.5 69 68 70.5 72 69.5 68 69.5 71.5 68.6 67 64.4 68.8    Preferred Learning Style:  No preference indicated   Learning Readiness:  Ready  24-hr recall: B (6:30-6:50 AM): 2 links of [place] sausage and oatmeal (16g) Snk (AM): cheese stick or almonds (6g) Snk (AM): 1/2 Premier protein bar or Unjury and vanilla yogurt or cottage cheese with peaches (15-20g) L (12 PM): 1/2 hot dog or 1/2 hamburger OR 2 oz chicken leg or salad with ham and [place] (7-14g) Snk: 3 strawberries and blueberries OR goldfish OR almonds D (6PM): chili and cornbread (10g) Snk (PM): popcorn, graham crackers or shortbread  cookies  Fluid intake: diet cranberry juice and water, diet green tea (close to 64 oz), inconsistent Estimated total protein intake: 56 g per day  Medications: see list Supplementation: taking   Using straws: yes, no gas pain Drinking while eating: some sips Hair loss: yes Carbonated beverages: diet Mountain Dew N/V/D/C: constipation resolved; vomiting when she eats too much/fast Dumping syndrome: no  Recent physical activity: walking and going to the gym (a few times a week, varies)  Progress Towards Goal(s):  In progress.  Nutritional Diagnosis:  [place]-3.3 Overweight/obesity related to past poor dietary habits and physical inactivity as evidenced by patient w/ recent gastric sleeve surgery following dietary guidelines for continued weight loss.   Intervention:  Nutrition counseling provided.  Handouts provided: Pre op diet  Teaching Method Utilized:  Visual Auditory Hands on  Barriers to learning/adherence to lifestyle change: none  Demonstrated degree of understanding via:  Teach Back   Monitoring/Evaluation:  Dietary intake, exercise, and body weight. Follow up in 2 months.

## 2016-03-03 DIAGNOSIS — R079 Chest pain, unspecified: Secondary | ICD-10-CM | POA: Diagnosis not present

## 2016-03-22 DIAGNOSIS — S76911A Strain of unspecified muscles, fascia and tendons at thigh level, right thigh, initial encounter: Secondary | ICD-10-CM | POA: Diagnosis not present

## 2016-04-26 ENCOUNTER — Encounter: Payer: 59 | Attending: Surgery | Admitting: Skilled Nursing Facility1

## 2016-04-26 ENCOUNTER — Encounter: Payer: Self-pay | Admitting: Skilled Nursing Facility1

## 2016-04-26 DIAGNOSIS — Z9884 Bariatric surgery status: Secondary | ICD-10-CM | POA: Insufficient documentation

## 2016-04-26 DIAGNOSIS — Z713 Dietary counseling and surveillance: Secondary | ICD-10-CM | POA: Insufficient documentation

## 2016-04-26 NOTE — Progress Notes (Signed)
  Follow-up visit:  2 Years Post-Operative Gastric sleeve Surgery  Medical Nutrition Therapy:  Appt start time: 930 end time: 1000   Primary concerns today: Post-operative Bariatric Surgery Nutrition Management.  Chelsea Landry returns today for a bariatric nutrition follow up.  Pt arrives worried about weight gain/not losing wt. Pt does not believe herself to be an overeater but then at the end of the appointment did admit to eating several times throughout the day. Pt states she rotates shifts which does not enable her to drink at work due to having to use the restroom more often. Pt states she makes the decision to either drink or eat and always chooses to eat. Pt states popcorn is her weakness. 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  Surgery date: 12/31/2013 Surgery type: Gastric sleeve [facility] weight at [facility]: 205.5 lbs Weight today: 163.2 lbs Weight change: 5 lbs gain  Total weight lost: 42 lbs Goal weight: 150 lbs   TANITA  BODY COMP RESULTS  12/24/13 01/15/14 02/25/14 04/08/14 05/20/14 07/03/14 08/20/14 10/02/14 11/20/14 02/21/15 04/24/15 06/05/15 07/24/15 09/22/15 11/18/15 04/26/2016   BMI (kg/m^2) 36.5 33.5 31.2 28.7 27.5 27 27.3 27.4 27.3 27.7 28.5 28.8 28.9 29.8 30.1 30.3   Fat Mass (lbs) 102 88.0 72.5 63 57.5 51 51 55 56.5 56.5 58.5 58.6 62 70.8 66.2 71.6   Fat Free Mass (lbs) 97.5 95.0 98 94 93 96.5 98.5 95 93 95 97.5 98.6 96 92.4 98.4 94.2   Total Body Water (lbs) 71.5 69.5 71.5 69 68 70.5 72 69.5 68 69.5 71.5 68.6 67 64.4 68.8 65.8    Preferred Learning Style:  No preference indicated   Learning Readiness:  Ready  24-hr recall: B (6:30-6:50 AM): 2 links of [place] sausage and oatmeal (16g) with fruit   Snk (AM): cheese stick or almonds (6g) Snk (AM): 1/2 Premier protein bar or Unjury and vanilla yogurt or cottage cheese with peaches (15-20g) L (12 PM): 1/2 hot dog or 1/2 hamburger OR 2 oz chicken leg or salad with ham and [place] (7-14g) Snk: 3 strawberries and  blueberries OR goldfish OR almonds D (6PM): chili and cornbread (10g) Snk (PM): popcorn, graham crackers or shortbread cookies  Fluid intake: crystal light water: 40 oz inconsistent  Estimated total protein intake: 56 g per day  Medications: see list Supplementation: taking   Using straws: yes, no gas pain Drinking while eating: some sips Hair loss: yes Carbonated beverages: diet Mountain Dew N/V/D/C: constipation resolved; vomiting when she eats too much/fast Dumping syndrome: no  Recent physical activity: walking and going to the gym (a few times a week, varies)  Progress Towards Goal(s):  In progress.  Nutritional Diagnosis:  [name]-3.3 Overweight/obesity related to past poor dietary habits and physical inactivity as evidenced by patient w/ recent gastric sleeve surgery following dietary guidelines for continued weight loss.   Intervention:  Nutrition counseling provided. Goals: -Have a pitcher of crystal light water ready to go in the fridge  -Try to keep a journal of all the foods you eat throughout the day and the amounts  -Try to stick to 3 cups of popcorn  Handouts provided: Pre op diet  Teaching Method Utilized:  Visual Auditory Hands on  Barriers to learning/adherence to lifestyle change: none  Demonstrated degree of understanding via:  Teach Back   Monitoring/Evaluation:  Dietary intake, exercise, and body weight. Follow up in 1 months.

## 2016-04-26 NOTE — Patient Instructions (Addendum)
-  Have a pitcher of crystal light water ready to go in the fridge   -Try to keep a journal of all the foods you eat throughout the day and the amounts   -Try to stick to 3 cups of popcorn

## 2016-05-19 DIAGNOSIS — R072 Precordial pain: Secondary | ICD-10-CM | POA: Diagnosis not present

## 2016-05-19 DIAGNOSIS — I10 Essential (primary) hypertension: Secondary | ICD-10-CM | POA: Diagnosis not present

## 2016-05-19 DIAGNOSIS — E034 Atrophy of thyroid (acquired): Secondary | ICD-10-CM | POA: Diagnosis not present

## 2016-05-27 ENCOUNTER — Encounter: Payer: Self-pay | Admitting: Skilled Nursing Facility1

## 2016-05-27 ENCOUNTER — Encounter: Payer: 59 | Attending: Surgery | Admitting: Skilled Nursing Facility1

## 2016-05-27 DIAGNOSIS — Z9884 Bariatric surgery status: Secondary | ICD-10-CM | POA: Insufficient documentation

## 2016-05-27 DIAGNOSIS — Z713 Dietary counseling and surveillance: Secondary | ICD-10-CM | POA: Insufficient documentation

## 2016-05-27 NOTE — Progress Notes (Signed)
  Follow-up visit:  2 Years Post-Operative Gastric sleeve Surgery  Medical Nutrition Therapy:  Appt start time: 830 end time: 9:14   Primary concerns today: Post-operative Bariatric Surgery Nutrition Management.  Chelsea Landry returns today for a bariatric nutrition follow up.   Pt has been using my fitness pal and brought those print outs with her aiming 1270 calories and having 45% fat. Pt states she Rotates shifts every 2 weeks between 2nd and 3rd shift.Pt states she has been working on Increasing her water using a 20 ounce bottle so now up to 40 ounces at least. Will discuss physical activity more specificly next time.  Surgery date: 12/31/2013 Surgery type: Gastric sleeve Start weight at [facility]: 205.5 lbs Weight today: 168 lbs (lost 3.8 lbs in fat) Weight change: 5 lbs gain  Goal weight: 140-150 lbs   TANITA  BODY COMP RESULTS  12/24/13 01/15/14 02/25/14 04/08/14 05/20/14 07/03/14 08/20/14 10/02/14 11/20/14 02/21/15 04/24/15 06/05/15 07/24/15 09/22/15 11/18/15 04/26/2016 05/27/2016   BMI (kg/m^2) 36.5 33.5 31.2 28.7 27.5 27 27.3 27.4 27.3 27.7 28.5 28.8 28.9 29.8 30.1 30.3 30.7   Fat Mass (lbs) 102 88.0 72.5 63 57.5 51 51 55 56.5 56.5 58.5 58.6 62 70.8 66.2 71.6 67.8   Fat Free Mass (lbs) 97.5 95.0 98 94 93 96.5 98.5 95 93 95 97.5 98.6 96 92.4 98.4 94.2 100.2   Total Body Water (lbs) 71.5 69.5 71.5 69 68 70.5 72 69.5 68 69.5 71.5 68.6 67 64.4 68.8 65.8 70.2    Preferred Learning Style:  No preference indicated   Learning Readiness:  Ready  24-hr recall: B (6:30-6:50 AM): 2 links of [place] sausage and oatmeal (16g) with fruit   Snk (AM): cheese stick or almonds (6g) Snk (AM): 1/2 Premier protein bar or Unjury and vanilla yogurt or cottage cheese with peaches (15-20g) L (12 PM): 1/2 hot dog or 1/2 hamburger OR 2 oz chicken leg or salad with ham and [place] (7-14g) Snk: 3 strawberries and blueberries OR goldfish OR almonds D (6PM): chili and cornbread (10g) Snk (PM): popcorn, graham crackers or  shortbread cookies  Fluid intake: crystal light water: 40 oz inconsistent  Estimated total protein intake: 56 g per day  Medications: see list Supplementation: taking a one day and calcium   Using straws: yes, no gas pain Drinking while eating: some sips Hair loss: yes Carbonated beverages: diet Mountain Dew N/V/D/C: constipation resolved; vomiting when she eats too much/fast Dumping syndrome: no  Recent physical activity: walking and going to the gym (a few times a week, varies)  Progress Towards Goal(s):  In progress.  Nutritional Diagnosis:  [place]-3.3 Overweight/obesity related to past poor dietary habits and physical inactivity as evidenced by patient w/ recent gastric sleeve surgery following dietary guidelines for continued weight loss.   Intervention:  Nutrition counseling provided. Goals:  -Increase cho/protein decrease fats  -Try to cut back on fat, low fat crackers, low fat cheese, low fat sausage  -Aim for 21 grams of protein at each meal -stretch after 2 nd shift when you get home  Handouts provided: Pre op diet  Teaching Method Utilized:  Visual Auditory Hands on  Barriers to learning/adherence to lifestyle change: none  Demonstrated degree of understanding via:  Teach Back   Monitoring/Evaluation:  Dietary intake, exercise, and body weight. Follow up in 1 months.

## 2016-06-04 ENCOUNTER — Other Ambulatory Visit: Payer: Self-pay | Admitting: Surgery

## 2016-06-04 DIAGNOSIS — N289 Disorder of kidney and ureter, unspecified: Secondary | ICD-10-CM

## 2016-06-16 ENCOUNTER — Ambulatory Visit
Admission: RE | Admit: 2016-06-16 | Discharge: 2016-06-16 | Disposition: A | Discharge status: Home / Self Care | Payer: 59 | Source: Ambulatory Visit | Attending: Surgery | Admitting: Surgery

## 2016-06-16 DIAGNOSIS — N289 Disorder of kidney and ureter, unspecified: Secondary | ICD-10-CM

## 2016-06-16 DIAGNOSIS — D3000 Benign neoplasm of unspecified kidney: Secondary | ICD-10-CM | POA: Diagnosis not present

## 2016-06-16 MED ORDER — GADOBENATE DIMEGLUMINE 529 MG/ML IV SOLN
15.0000 mL | Freq: Once | INTRAVENOUS | Status: AC | PRN
  Administered 2016-06-16: 15 mL via INTRAVENOUS

## 2016-06-22 DIAGNOSIS — Z01419 Encounter for gynecological examination (general) (routine) without abnormal findings: Secondary | ICD-10-CM | POA: Diagnosis not present

## 2016-06-29 ENCOUNTER — Encounter: Payer: 59 | Attending: Surgery | Admitting: Skilled Nursing Facility1

## 2016-06-29 ENCOUNTER — Encounter: Payer: Self-pay | Admitting: Skilled Nursing Facility1

## 2016-06-29 DIAGNOSIS — Z713 Dietary counseling and surveillance: Secondary | ICD-10-CM | POA: Insufficient documentation

## 2016-06-29 DIAGNOSIS — Z9884 Bariatric surgery status: Secondary | ICD-10-CM | POA: Insufficient documentation

## 2016-06-29 NOTE — Progress Notes (Signed)
  Follow-up visit:  2 Years Post-Operative Gastric sleeve Surgery  Medical Nutrition Therapy:  Appt start time: 830 end time: 9:14   Primary concerns today: Post-operative Bariatric Surgery Nutrition Management.  Chelsea Landry returns today for a bariatric nutrition follow up.   Pt has been using my fitness pal and brought those print outs with her aiming 1270 calories and having 45% fat. Pt states she Rotates shifts every 2 weeks between 2nd and 3rd shift.Pt states she has been working on Increasing her water using a 20 ounce bottle so now up to 40 ounces at least.  Pt asked for a letter for work in order to better meet her fluid needs. Pt state she has gotten low fat crackers and low fat cheese. Pt states she still wants to see weight loss but cannot commit to more exercise.   Surgery date: 12/31/2013 Surgery type: Gastric sleeve Start weight at [facility]: 205.5 lbs Weight today: 163.6 lbs  Weight change: 5 lbs lost  Goal weight: 140-150 lbs   TANITA  BODY COMP RESULTS  12/24/13 01/15/14 02/25/14 04/08/14 05/20/14 07/03/14 08/20/14 10/02/14 11/20/14 02/21/15 04/24/15 06/05/15 07/24/15 09/22/15 11/18/15 04/26/2016 05/27/2016 06/29/2016   BMI (kg/m^2) 36.5 33.5 31.2 28.7 27.5 27 27.3 27.4 27.3 27.7 28.5 28.8 28.9 29.8 30.1 30.3 30.7 29.9   Fat Mass (lbs) 102 88.0 72.5 63 57.5 51 51 55 56.5 56.5 58.5 58.6 62 70.8 66.2 71.6 67.8 67.8   Fat Free Mass (lbs) 97.5 95.0 98 94 93 96.5 98.5 95 93 95 97.5 98.6 96 92.4 98.4 94.2 100.2 95.8   Total Body Water (lbs) 71.5 69.5 71.5 69 68 70.5 72 69.5 68 69.5 71.5 68.6 67 64.4 68.8 65.8 70.2 66.8    Preferred Learning Style:  No preference indicated   Learning Readiness:  Ready  24-hr recall: B (6:30-6:50 AM): 2 links of [place] sausage and oatmeal (16g) with fruit   Snk (AM): cheese stick or almonds (6g) Snk (AM): 1/2 Premier protein bar or Unjury and vanilla yogurt or cottage cheese with peaches (15-20g) L (12 PM): 1/2 hot dog or 1/2 hamburger OR 2 oz chicken leg or  salad with ham and [place] (7-14g) Snk: 3 strawberries and blueberries OR goldfish OR almonds D (6PM): chili and cornbread (10g) Snk (PM): popcorn, graham crackers or shortbread cookies  Fluid intake: crystal light water: 40 oz inconsistent  Estimated total protein intake: 71-72 g per day  Medications: see list Supplementation: taking a one day and calcium   Using straws: yes, no gas pain Drinking while eating: some sips Hair loss: yes Carbonated beverages: diet Mountain Dew N/V/D/C: constipation resolved; vomiting when she eats too much/fast Dumping syndrome: no  Recent physical activity: walking and going to the gym (a few times a week, inconsistent due to shift changes)  Progress Towards Goal(s):  In progress.  Nutritional Diagnosis:  [place]-3.3 Overweight/obesity related to past poor dietary habits and physical inactivity as evidenced by patient w/ recent gastric sleeve surgery following dietary guidelines for continued weight loss.   Intervention:  Nutrition counseling provided.  Handouts provided: Pre op diet  Teaching Method Utilized:  Visual Auditory Hands on  Barriers to learning/adherence to lifestyle change: none  Demonstrated degree of understanding via:  Teach Back   Monitoring/Evaluation:  Dietary intake, exercise, and body weight. Follow up in 1 months.

## 2016-07-05 DIAGNOSIS — H35461 Secondary vitreoretinal degeneration, right eye: Secondary | ICD-10-CM | POA: Diagnosis not present

## 2016-07-08 ENCOUNTER — Ambulatory Visit: Payer: 59 | Admitting: Skilled Nursing Facility1

## 2016-08-10 ENCOUNTER — Encounter: Payer: Self-pay | Admitting: Skilled Nursing Facility1

## 2016-08-10 ENCOUNTER — Encounter: Payer: 59 | Attending: Surgery | Admitting: Skilled Nursing Facility1

## 2016-08-10 DIAGNOSIS — Z9884 Bariatric surgery status: Secondary | ICD-10-CM | POA: Diagnosis not present

## 2016-08-10 DIAGNOSIS — Z713 Dietary counseling and surveillance: Secondary | ICD-10-CM | POA: Insufficient documentation

## 2016-08-10 NOTE — Progress Notes (Addendum)
  Follow-up visit:  2 Years Post-Operative Gastric sleeve Surgery  Medical Nutrition Therapy:  Appt start time: 830 end time: 9:14   Primary concerns today: Post-operative Bariatric Surgery Nutrition Management.  Pt states she has trouble with eating too much in general. Pt states she is snacking all day long less than an hour apart. Pt states this was due to vacation habits. Pt states her husband has dementia which is very stressful and work is still stressful with coworkers trying to get her in trouble for having water on the line and switching from second to third shift.  Talk about drinking with meals for next time.  Surgery date: 12/31/2013 Surgery type: Gastric sleeve Start weight at [facility]: 205.5 lbs Weight today: 165.8 lbs  Weight change: 2 lbs gained  Goal weight: 140-150 lbs   TANITA  BODY COMP RESULTS  12/24/13 01/15/14 02/25/14 04/08/14 05/20/14 07/03/14 08/20/14 10/02/14 11/20/14 02/21/15 04/24/15 06/05/15 07/24/15 09/22/15 11/18/15 04/26/2016 05/27/2016 06/29/2016   BMI (kg/m^2) 36.5 33.5 31.2 28.7 27.5 27 27.3 27.4 27.3 27.7 28.5 28.8 28.9 29.8 30.1 30.3 30.7 29.9   Fat Mass (lbs) 102 88.0 72.5 63 57.5 51 51 55 56.5 56.5 58.5 58.6 62 70.8 66.2 71.6 67.8 67.8   Fat Free Mass (lbs) 97.5 95.0 98 94 93 96.5 98.5 95 93 95 97.5 98.6 96 92.4 98.4 94.2 100.2 95.8   Total Body Water (lbs) 71.5 69.5 71.5 69 68 70.5 72 69.5 68 69.5 71.5 68.6 67 64.4 68.8 65.8 70.2 66.8   TANITA  BODY COMP RESULTS  08/10/2016   BMI (kg/m^2) 30.3   Fat Mass (lbs) 70.2   Fat Free Mass (lbs) 95.6   Total Body Water (lbs) 66.8    Preferred Learning Style:  No preference indicated   Learning Readiness:  Ready  24-hr recall: B (6:30-6:50 AM): 2 links of [place] sausage and oatmeal (16g) with fruit   Snk (AM): cheese stick or almonds (6g) Snk (AM): 1/2 Premier protein bar or Unjury and vanilla yogurt or cottage cheese with peaches (15-20g) L (12 PM): 1/2 hot dog or 1/2 hamburger OR 2 oz chicken leg or salad  with ham and [place] (7-14g) sometimes fried fish Snk: 3 strawberries and blueberries OR goldfish OR almonds D (6PM): chili and cornbread (10g) Snk (PM): popcorn, graham crackers or shortbread cookies  Fluid intake: crystal light water: 40 oz inconsistent  Estimated total protein intake: 71-72 g per day  Medications: see list Supplementation: taking a one day and calcium and fish oil  Using straws: yes, no gas pain Drinking while eating: some sips Hair loss: yes Carbonated beverages: diet Mountain Dew N/V/D/C: constipation resolved; vomiting when she eats too much/fast Dumping syndrome: no  Recent physical activity: walking and going to the gym (a few times a week, inconsistent due to shift changes)  Progress Towards Goal(s):  In progress.  Nutritional Diagnosis:  [place]-3.3 Overweight/obesity related to past poor dietary habits and physical inactivity as evidenced by patient w/ recent gastric sleeve surgery following dietary guidelines for continued weight loss.   Intervention:  Nutrition counseling provided. Goals: -Avoid the candy aisle focus on sugar free chewing gum -Try drinking water instead of snacking -Try to eat every 3 hours, not in between that time   Teaching Method Utilized:  Visual Auditory Hands on  Barriers to learning/adherence to lifestyle change: none  Demonstrated degree of understanding via:  Teach Back   Monitoring/Evaluation:  Dietary intake, exercise, and body weight. Follow up in 1 months.

## 2016-08-10 NOTE — Patient Instructions (Addendum)
-  Avoid the candy aisle focus on sugar free chewing gum  -Try drinking water instead of snacking  -Try to eat every 3 hours, not in between that time

## 2016-09-13 ENCOUNTER — Other Ambulatory Visit: Payer: Self-pay | Admitting: Obstetrics and Gynecology

## 2016-09-13 DIAGNOSIS — Z1231 Encounter for screening mammogram for malignant neoplasm of breast: Secondary | ICD-10-CM

## 2016-10-13 ENCOUNTER — Encounter: Payer: 59 | Attending: Surgery | Admitting: Skilled Nursing Facility1

## 2016-10-13 ENCOUNTER — Encounter: Payer: Self-pay | Admitting: Skilled Nursing Facility1

## 2016-10-13 DIAGNOSIS — Z9884 Bariatric surgery status: Secondary | ICD-10-CM | POA: Diagnosis not present

## 2016-10-13 DIAGNOSIS — Z713 Dietary counseling and surveillance: Secondary | ICD-10-CM | POA: Insufficient documentation

## 2016-10-13 NOTE — Patient Instructions (Addendum)
-  Aim for 64-70 fluid ounces every day   -Stay away form K and W  -Use pam spray instead of butter  -Stick to 6 food events a day  -Take 30 minutes to eat your meals  -When you are stressed:   Play games on the computer  Read a book  Scrap booking on your children  -Use your handouts

## 2016-10-13 NOTE — Progress Notes (Signed)
Follow-up visit:  2 Years Post-Operative Gastric sleeve Surgery  Medical Nutrition Therapy:  Appt start time: 830 end time: 9:14   Primary concerns today: Post-operative Bariatric Surgery Nutrition Management.  Pt states she Wants a tummy tuck/lipo from a Psychiatric nurse in [place]. 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 Pts husband is still dealing with dementia which has been very difficult for her. Pt states she emotionally eats. Pt states she really wants her belly fat to be gone and is tired of being this weight.   Surgery date: 12/31/2013 Surgery type: Gastric sleeve Start weight at [facility]: 205.5 lbs Weight today: 165.8 lbs  Weight change: 2 lbs gained  Goal weight: 140-150 lbs   TANITA  BODY COMP RESULTS  12/24/13 01/15/14 02/25/14 04/08/14 05/20/14 07/03/14 08/20/14 10/02/14 11/20/14 02/21/15 04/24/15 06/05/15 07/24/15 09/22/15 11/18/15 04/26/2016 05/27/2016 06/29/2016   BMI (kg/m^2) 36.5 33.5 31.2 28.7 27.5 27 27.3 27.4 27.3 27.7 28.5 28.8 28.9 29.8 30.1 30.3 30.7 29.9   Fat Mass (lbs) 102 88.0 72.5 63 57.5 51 51 55 56.5 56.5 58.5 58.6 62 70.8 66.2 71.6 67.8 67.8   Fat Free Mass (lbs) 97.5 95.0 98 94 93 96.5 98.5 95 93 95 97.5 98.6 96 92.4 98.4 94.2 100.2 95.8   Total Body Water (lbs) 71.5 69.5 71.5 69 68 70.5 72 69.5 68 69.5 71.5 68.6 67 64.4 68.8 65.8 70.2 66.8   TANITA  BODY COMP RESULTS  08/10/2016 10/13/2016   BMI (kg/m^2) 30.3 31   Fat Mass (lbs) 70.2 69.8   Fat Free Mass (lbs) 95.6 99.8   Total Body Water (lbs) 66.8 70    Preferred Learning Style:  No preference indicated   Learning Readiness:  Ready  24-hr recall: B (6:30-6:50 AM): 2 links of [place] sausage, grapes, and roll Snk (AM): italian sausage with peppers and onions sauteed in butter Snk (AM): 1/4 slice of cake L (12 PM): 2 cuties and 100 calorie pack of cookies and grapes and fruit snacks Snk: grapes and 3 ritz crackers and cheese D (6PM): meat and vegetable  Snk (PM): popcorn  Fluid intake:  crystal light water: at least 50 oz inconsistent  Estimated total protein intake: 71-72 g per day  Medications: see list Supplementation: taking a one day and calcium and fish oil  Using straws: yes, no gas pain Drinking while eating: some sips Hair loss: yes Carbonated beverages: diet Mountain Dew N/V/D/C: constipation resolved; vomiting when she eats too much/fast Dumping syndrome: no  Recent physical activity: walking and going to the gym (a few times a week, inconsistent due to shift changes)  Progress Towards Goal(s):  In progress.  Nutritional Diagnosis:  [place]-3.3 Overweight/obesity related to past poor dietary habits and physical inactivity as evidenced by patient w/ recent gastric sleeve surgery following dietary guidelines for continued weight loss.   Intervention:  Nutrition counseling provided. Dietitian educated ht ept on dealing with emotional eating. Goals: -Aim for 64-70 fluid ounces every day  -Stay away form K and W -Use pam spray instead of butter -Stick to 6 food events a day -Take 30 minutes to eat your meals -When you are stressed:  Play games on the computer  Read a book  Scrap booking on your children -Use your handouts: needs/emotions, I want to eat, meal ideas  Teaching Method Utilized:  Visual Auditory Hands on  Barriers to learning/adherence to lifestyle change: none  Demonstrated degree of understanding via:  Teach Back   Monitoring/Evaluation:  Dietary intake, exercise, and body  weight. Follow up in 1 months.

## 2016-10-18 ENCOUNTER — Ambulatory Visit
Admission: RE | Admit: 2016-10-18 | Discharge: 2016-10-18 | Disposition: A | Discharge status: Home / Self Care | Payer: 59 | Source: Ambulatory Visit | Attending: Obstetrics and Gynecology | Admitting: Obstetrics and Gynecology

## 2016-10-18 DIAGNOSIS — Z1231 Encounter for screening mammogram for malignant neoplasm of breast: Secondary | ICD-10-CM | POA: Diagnosis not present

## 2016-12-14 ENCOUNTER — Ambulatory Visit: Payer: 59 | Admitting: Skilled Nursing Facility1

## 2016-12-22 DIAGNOSIS — E559 Vitamin D deficiency, unspecified: Secondary | ICD-10-CM | POA: Diagnosis not present

## 2016-12-22 DIAGNOSIS — Z0000 Encounter for general adult medical examination without abnormal findings: Secondary | ICD-10-CM | POA: Diagnosis not present

## 2016-12-22 DIAGNOSIS — E7800 Pure hypercholesterolemia, unspecified: Secondary | ICD-10-CM | POA: Diagnosis not present

## 2017-01-13 DIAGNOSIS — Z9884 Bariatric surgery status: Secondary | ICD-10-CM | POA: Diagnosis not present

## 2017-02-16 DIAGNOSIS — H40023 Open angle with borderline findings, high risk, bilateral: Secondary | ICD-10-CM | POA: Diagnosis not present

## 2017-04-25 DIAGNOSIS — M7712 Lateral epicondylitis, left elbow: Secondary | ICD-10-CM | POA: Diagnosis not present

## 2017-04-25 DIAGNOSIS — M79602 Pain in left arm: Secondary | ICD-10-CM | POA: Diagnosis not present

## 2017-04-25 DIAGNOSIS — R7309 Other abnormal glucose: Secondary | ICD-10-CM | POA: Diagnosis not present

## 2017-08-02 DIAGNOSIS — H43393 Other vitreous opacities, bilateral: Secondary | ICD-10-CM | POA: Diagnosis not present

## 2017-08-02 DIAGNOSIS — Z124 Encounter for screening for malignant neoplasm of cervix: Secondary | ICD-10-CM | POA: Diagnosis not present

## 2017-08-02 DIAGNOSIS — Z01419 Encounter for gynecological examination (general) (routine) without abnormal findings: Secondary | ICD-10-CM | POA: Diagnosis not present

## 2017-08-11 DIAGNOSIS — M25522 Pain in left elbow: Secondary | ICD-10-CM | POA: Diagnosis not present

## 2017-08-11 DIAGNOSIS — M542 Cervicalgia: Secondary | ICD-10-CM | POA: Diagnosis not present

## 2017-08-11 DIAGNOSIS — M79602 Pain in left arm: Secondary | ICD-10-CM | POA: Diagnosis not present

## 2017-08-11 DIAGNOSIS — M79603 Pain in arm, unspecified: Secondary | ICD-10-CM | POA: Insufficient documentation

## 2017-08-23 DIAGNOSIS — M5412 Radiculopathy, cervical region: Secondary | ICD-10-CM | POA: Diagnosis not present

## 2017-08-30 DIAGNOSIS — M5412 Radiculopathy, cervical region: Secondary | ICD-10-CM | POA: Diagnosis not present

## 2017-09-05 DIAGNOSIS — M5412 Radiculopathy, cervical region: Secondary | ICD-10-CM | POA: Diagnosis not present

## 2017-09-09 DIAGNOSIS — M5412 Radiculopathy, cervical region: Secondary | ICD-10-CM | POA: Diagnosis not present

## 2017-09-14 DIAGNOSIS — M5412 Radiculopathy, cervical region: Secondary | ICD-10-CM | POA: Diagnosis not present

## 2017-09-22 DIAGNOSIS — M542 Cervicalgia: Secondary | ICD-10-CM | POA: Diagnosis not present

## 2017-09-22 DIAGNOSIS — M25522 Pain in left elbow: Secondary | ICD-10-CM | POA: Diagnosis not present

## 2017-10-18 ENCOUNTER — Other Ambulatory Visit: Payer: Self-pay | Admitting: Family Medicine

## 2017-10-18 DIAGNOSIS — Z1231 Encounter for screening mammogram for malignant neoplasm of breast: Secondary | ICD-10-CM

## 2017-10-26 ENCOUNTER — Ambulatory Visit
Admission: RE | Admit: 2017-10-26 | Discharge: 2017-10-26 | Disposition: A | Discharge status: Home / Self Care | Payer: 59 | Source: Ambulatory Visit

## 2017-10-26 DIAGNOSIS — Z1231 Encounter for screening mammogram for malignant neoplasm of breast: Secondary | ICD-10-CM | POA: Diagnosis not present

## 2017-10-26 DIAGNOSIS — Z9884 Bariatric surgery status: Secondary | ICD-10-CM | POA: Diagnosis not present

## 2017-11-02 ENCOUNTER — Other Ambulatory Visit: Payer: Self-pay | Admitting: Surgery

## 2017-11-02 DIAGNOSIS — N289 Disorder of kidney and ureter, unspecified: Secondary | ICD-10-CM

## 2017-11-11 ENCOUNTER — Ambulatory Visit
Admission: RE | Admit: 2017-11-11 | Discharge: 2017-11-11 | Disposition: A | Discharge status: Home / Self Care | Payer: 59 | Source: Ambulatory Visit | Attending: Surgery | Admitting: Surgery

## 2017-11-11 DIAGNOSIS — N289 Disorder of kidney and ureter, unspecified: Secondary | ICD-10-CM

## 2017-11-11 DIAGNOSIS — D1771 Benign lipomatous neoplasm of kidney: Secondary | ICD-10-CM | POA: Diagnosis not present

## 2017-11-11 MED ORDER — GADOBENATE DIMEGLUMINE 529 MG/ML IV SOLN
15.0000 mL | Freq: Once | INTRAVENOUS | Status: AC | PRN
  Administered 2017-11-11: 15 mL via INTRAVENOUS

## 2017-11-17 DIAGNOSIS — H0011 Chalazion right upper eyelid: Secondary | ICD-10-CM | POA: Diagnosis not present

## 2017-11-30 ENCOUNTER — Emergency Department (HOSPITAL_COMMUNITY)
Admission: EM | Admit: 2017-11-30 | Discharge: 2017-11-30 | Disposition: A | Discharge status: Home / Self Care | Payer: 59 | Attending: Emergency Medicine | Admitting: Emergency Medicine

## 2017-11-30 ENCOUNTER — Encounter (HOSPITAL_COMMUNITY): Payer: Self-pay | Admitting: Emergency Medicine

## 2017-11-30 ENCOUNTER — Encounter (HOSPITAL_COMMUNITY): Payer: Self-pay | Admitting: *Deleted

## 2017-11-30 ENCOUNTER — Ambulatory Visit (HOSPITAL_COMMUNITY)
Admission: EM | Admit: 2017-11-30 | Discharge: 2017-11-30 | Disposition: A | Discharge status: Home / Self Care | Payer: 59 | Source: Home / Self Care | Attending: Family Medicine | Admitting: Family Medicine

## 2017-11-30 ENCOUNTER — Emergency Department (HOSPITAL_COMMUNITY): Payer: 59

## 2017-11-30 ENCOUNTER — Other Ambulatory Visit: Payer: Self-pay

## 2017-11-30 DIAGNOSIS — R51 Headache: Secondary | ICD-10-CM

## 2017-11-30 DIAGNOSIS — Z79899 Other long term (current) drug therapy: Secondary | ICD-10-CM | POA: Insufficient documentation

## 2017-11-30 DIAGNOSIS — R519 Headache, unspecified: Secondary | ICD-10-CM

## 2017-11-30 DIAGNOSIS — R350 Frequency of micturition: Secondary | ICD-10-CM

## 2017-11-30 DIAGNOSIS — I10 Essential (primary) hypertension: Secondary | ICD-10-CM | POA: Insufficient documentation

## 2017-11-30 DIAGNOSIS — N3000 Acute cystitis without hematuria: Secondary | ICD-10-CM

## 2017-11-30 LAB — CBC WITH DIFFERENTIAL/PLATELET
ABS IMMATURE GRANULOCYTES: 0.05 10*3/uL (ref 0.00–0.07)
BASOS PCT: 0 %
Basophils Absolute: 0 10*3/uL (ref 0.0–0.1)
EOS ABS: 0 10*3/uL (ref 0.0–0.5)
Eosinophils Relative: 0 %
HCT: 44.6 % (ref 36.0–46.0)
Hemoglobin: 14.8 g/dL (ref 12.0–15.0)
IMMATURE GRANULOCYTES: 1 %
LYMPHS ABS: 1 10*3/uL (ref 0.7–4.0)
Lymphocytes Relative: 10 %
MCH: 29.8 pg (ref 26.0–34.0)
MCHC: 33.2 g/dL (ref 30.0–36.0)
MCV: 89.7 fL (ref 80.0–100.0)
MONOS PCT: 8 %
Monocytes Absolute: 0.8 10*3/uL (ref 0.1–1.0)
NEUTROS ABS: 8.1 10*3/uL — AB (ref 1.7–7.7)
NEUTROS PCT: 81 %
NRBC: 0 % (ref 0.0–0.2)
PLATELETS: 245 10*3/uL (ref 150–400)
RBC: 4.97 MIL/uL (ref 3.87–5.11)
RDW: 12.5 % (ref 11.5–15.5)
WBC: 9.9 10*3/uL (ref 4.0–10.5)

## 2017-11-30 LAB — POCT URINALYSIS DIP (DEVICE)
GLUCOSE, UA: NEGATIVE mg/dL
KETONES UR: 80 mg/dL — AB
Nitrite: NEGATIVE
Protein, ur: NEGATIVE mg/dL
Urobilinogen, UA: 0.2 mg/dL (ref 0.0–1.0)
pH: 5.5 (ref 5.0–8.0)

## 2017-11-30 LAB — COMPREHENSIVE METABOLIC PANEL
ALBUMIN: 3.8 g/dL (ref 3.5–5.0)
ALK PHOS: 65 U/L (ref 38–126)
ALT: 31 U/L (ref 0–44)
ANION GAP: 10 (ref 5–15)
AST: 30 U/L (ref 15–41)
BUN: 11 mg/dL (ref 8–23)
CALCIUM: 9.1 mg/dL (ref 8.9–10.3)
CO2: 22 mmol/L (ref 22–32)
Chloride: 106 mmol/L (ref 98–111)
Creatinine, Ser: 0.98 mg/dL (ref 0.44–1.00)
GFR calc non Af Amer: >60 mL/min (ref >60)
GLUCOSE: 115 mg/dL — AB (ref 70–99)
POTASSIUM: 3.7 mmol/L (ref 3.5–5.1)
Sodium: 138 mmol/L (ref 135–145)
TOTAL PROTEIN: 6.7 g/dL (ref 6.5–8.1)
Total Bilirubin: 0.9 mg/dL (ref 0.3–1.2)

## 2017-11-30 MED ORDER — PROCHLORPERAZINE EDISYLATE 10 MG/2ML IJ SOLN
10.0000 mg | Freq: Once | INTRAMUSCULAR | Status: AC
  Administered 2017-11-30: 10 mg via INTRAVENOUS
  Filled 2017-11-30: qty 2

## 2017-11-30 MED ORDER — ACETAMINOPHEN 325 MG PO TABS
ORAL_TABLET | ORAL | Status: AC
  Filled 2017-11-30: qty 2

## 2017-11-30 MED ORDER — CEPHALEXIN 500 MG PO CAPS: 500.0000 mg | ORAL_CAPSULE | Freq: Two times a day (BID) | ORAL | 0 refills | Status: AC

## 2017-11-30 MED ORDER — SODIUM CHLORIDE 0.9 % IV BOLUS
1000.0000 mL | Freq: Once | INTRAVENOUS | Status: AC
  Administered 2017-11-30: 1000 mL via INTRAVENOUS

## 2017-11-30 MED ORDER — DIPHENHYDRAMINE HCL 50 MG/ML IJ SOLN
25.0000 mg | Freq: Once | INTRAMUSCULAR | Status: AC
  Administered 2017-11-30: 25 mg via INTRAVENOUS
  Filled 2017-11-30: qty 1

## 2017-11-30 MED ORDER — ACETAMINOPHEN 325 MG PO TABS
650.0000 mg | ORAL_TABLET | Freq: Once | ORAL | Status: AC
  Administered 2017-11-30: 650 mg via ORAL

## 2017-11-30 NOTE — ED Triage Notes (Signed)
Headache started Monday night.  Pain is mainly top of head and forehead.  If patient bends forward, entire head hurts.  Denies runny nose.    Patient noticed last night frequent urination

## 2017-11-30 NOTE — ED Notes (Signed)
Took patient saline lock out patient is resting with call bell in reach and family at bedside

## 2017-11-30 NOTE — Discharge Instructions (Signed)
Recommending further evaluation and management in the ED for worst headache of her life.  Patient aware and in agreement with this plan.

## 2017-11-30 NOTE — ED Triage Notes (Signed)
Pt in c/o headache that started Monday night, pain to is to top of her head and radiates down in her face, pt also reports frequent urination since last night. Pt denies headaches like this in the past, denies n/v, denies vision changes or dizziness

## 2017-11-30 NOTE — ED Provider Notes (Signed)
[facility] EMERGENCY DEPARTMENT Provider Note   CSN: [id number] Arrival date & time: 11/30/17  9485     History   Chief Complaint Chief Complaint  Patient presents with  . Headache    HPI Chelsea Landry is a 62 y.o. female with a history of hypertension who presents to the emergency department from UC with a chief complaint of headache.  The patient endorses a constant, severe, 10/10 headache that began 2 nights ago.  She is unable to characterize the pain.  She states the headache starts over her bilateral forehead and radiates across the top of her head.  Pain is worse when she leans forward and light also makes her headache worse, but she denies photophobia and phonophobia.  She also reports subjective fever, chills, urinary frequency and urinary hesitancy over the last few days.  Denies abdominal pain, back pain, nausea, vomiting, diarrhea, constipation, diplopia, blurred vision, tinnitus, chest pain, dyspnea, otalgia, dizziness, lightheadedness, sinus pain or pressure, nasal congestion, slurred speech, facial drooping, numbness, or weakness.  She reports she awoke at 4 AM to walk to the bathroom and had some new gait instability and had to hold onto the wall.  She has been treating her symptoms with Aleve and Tylenol without improvement.  She reports her blood pressure has been well controlled and is 145/95 last night and 137/90 when she checked it this morning.  She is up-to-date on her flu immunization.  She is a never smoker.  No family history of CVA.  The history is provided by the patient. No language interpreter was used.    Past Medical History:  Diagnosis Date  . Fibroids   . Frequency of urination   . Hypertension   . Rash    RT LEG  . SVD (spontaneous vaginal delivery)    x 2  . Vaginal lesion     Patient Active Problem List   Diagnosis Date Noted  . Chest pain, rule out acute myocardial infarction 02/03/2016  . S/P laparoscopic sleeve  gastrectomy Dec 2015 12/31/2013  . Morbid obesity ([place]) 02/28/2013  . Lipoma of arm - both forearms - 4 cm, subcutaneous 12/28/2010  . Lipoma of back - 3 cm 12/28/2010    Past Surgical History:  Procedure Laterality Date  . BREATH TEK H PYLORI N/A 04/17/2013   Procedure: BREATH TEK H PYLORI;  Surgeon: Pedro Earls, MD;  Location: [facility];  Service: General;  Laterality: N/A;  . CHOLECYSTECTOMY  2001  . COLONOSCOPY  2006  . DILATATION & CURETTAGE/HYSTEROSCOPY WITH MYOSURE N/A 12/27/2013   Procedure: DILATATION & CURETTAGE/HYSTEROSCOPY WITH MYOSURE;  Surgeon: Crawford Givens, MD;  Location: [facility];  Service: Gynecology;  Laterality: N/A;  . HYSTEROSCOPY    . LAPAROSCOPIC GASTRIC SLEEVE RESECTION N/A 12/31/2013   Procedure: LAPAROSCOPIC GASTRIC SLEEVE RESECTION w/Upper GI/gen and hiatal hernia repair;  Surgeon: Pedro Earls, MD;  Location: [facility];  Service: General;  Laterality: N/A;  . 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 Authorizing Provider  amLODipine (NORVASC) 2.5 MG tablet Take 1 tablet (2.5 mg total) by mouth daily. 02/03/16   Dixie Dials, MD  cephALEXin (KEFLEX) 500 MG capsule Take 1 capsule (500 mg total) by mouth 2 (two) times daily for 7 days. 11/30/17 12/07/17  McDonald, Mia A, PA-C  losartan (COZAAR) 50 MG tablet Take 50 mg by mouth daily. 03/01/14   [provider]  metoprolol succinate (TOPROL-XL) 25 MG 24 hr tablet Take 1 tablet (25 mg total) by mouth at bedtime. 02/03/16   Dixie Dials, MD  naproxen (NAPROSYN) 500 MG tablet Take 1 tablet (500 mg total) by mouth 2 (two) times daily with a meal. 02/22/16   [place], Orson Ape, FNP  nitroGLYCERIN (NITROSTAT) 0.4 MG SL tablet Place 1 tablet  (0.4 mg total) under the tongue every 5 (five) minutes as needed for chest pain. 02/03/16   Dixie Dials, MD  omega-3 acid ethyl esters (LOVAZA) 1 g capsule Take 1 g by mouth daily.    [provider]  Pediatric Multivit-Minerals-C (FLINTSTONES COMPLETE PO) Take 1 tablet by mouth daily.    [provider]    Family History Family History  Problem Relation Age of Onset  . Leukemia Mother   . Aneurysm Father   . Cancer Brother        brain  . Hypertension Other   . Breast cancer Neg Hx     Social History Social History   Tobacco Use  . Smoking status: Never Smoker  . Smokeless tobacco: Never Used  Substance Use Topics  . Alcohol use: No  . Drug use: No     Allergies   Patient has no known allergies.   Review of Systems Review of Systems  Constitutional: Positive for chills and fever. Negative for activity change.  HENT: Negative for congestion, sinus pressure, sinus pain and sore throat.   Respiratory: Negative for shortness of breath and wheezing.   Cardiovascular: Negative for chest pain and palpitations.  Gastrointestinal: Negative for abdominal pain, nausea and vomiting.  Genitourinary: Positive for frequency. Negative for dysuria, vaginal bleeding, vaginal discharge and vaginal pain.       Urinary frequency and hesitancy  Musculoskeletal: Positive for gait problem. Negative for back pain, neck pain and neck stiffness.  Skin: Negative for rash.  Allergic/Immunologic: Negative for immunocompromised state.  Neurological: Positive for headaches. Negative for dizziness, weakness and numbness.  Psychiatric/Behavioral: Negative for confusion.     Physical Exam Updated Vital Signs There were no vitals taken for this visit.  Physical Exam  Constitutional: She appears well-developed and well-nourished. No distress.  HENT:  Head: Normocephalic.  Right Ear: Hearing, tympanic membrane and ear canal normal.  Left Ear: Hearing, tympanic membrane and ear  canal normal.  Nose: Right sinus exhibits no maxillary sinus tenderness and no frontal sinus tenderness. Left sinus exhibits no maxillary sinus tenderness and no frontal sinus tenderness.  Mouth/Throat: Uvula is midline, oropharynx is clear and moist and mucous membranes are normal. No tonsillar exudate.  Eyes: Pupils are equal, round, and reactive to light. Conjunctivae and EOM are normal. No scleral icterus.  Neck: Normal range of motion. Neck supple.  No meningismus.  Cardiovascular: Normal rate and regular rhythm. Exam reveals no gallop and no friction rub.  No murmur heard. Pulmonary/Chest: Effort normal and breath sounds normal. No stridor. No respiratory distress. She has no wheezes. She has no rales.  She exhibits no tenderness.  Abdominal: Soft. Bowel sounds are normal. She exhibits no distension and no mass. There is no tenderness. There is no rebound and no guarding. No hernia.  Neurological: She is alert.  Mild ataxia with tandem gait, but negative with heel and toe walking.   Alert and oriented x4.  GCS 15.  Moves all 4 extremities.  Cranial nerves II through XII are grossly intact.  Sensation to sharp and light touch is symmetric and equal throughout the bilateral upper and lower extremities.  5 out of 5 strength against resistance of the large muscle groups of the bilateral upper and lower extremities.  No clonus bilaterally.  No cogwheeling.  Able to bear weight on the bilateral lower extremities.  Finger-to-nose is intact and equal bilaterally.  Heel-to-shin is equal and intact bilaterally.  Normal rapid alternating movements.  Negative Romberg.  No pronator drift.  Skin: Skin is warm. No rash noted. She is not diaphoretic.  Psychiatric: Her behavior is normal.  Nursing note and vitals reviewed.    ED Treatments / Results  Labs (all labs ordered are listed, but only abnormal results are displayed) Labs Reviewed  COMPREHENSIVE METABOLIC PANEL - Abnormal; Notable for the  following components:      Result Value   Glucose, Bld 115 (*)    All other components within normal limits  CBC WITH DIFFERENTIAL/PLATELET - Abnormal; Notable for the following components:   Neutro Abs 8.1 (*)    All other components within normal limits    EKG None  Radiology Ct Head Wo Contrast  Result Date: 11/30/2017 CLINICAL DATA:  Headache in general weakness for 3 days. EXAM: CT HEAD WITHOUT CONTRAST TECHNIQUE: Contiguous axial images were obtained from the base of the skull through the vertex without intravenous contrast. COMPARISON:  None. FINDINGS: Brain: No evidence of acute infarction, hemorrhage, hydrocephalus, extra-axial collection or mass lesion/mass effect. Vascular: No hyperdense vessel or unexpected calcification. Skull: Normal. Negative for fracture or focal lesion. Sinuses/Orbits: Negative. Other: None. IMPRESSION: Normal head CT. Electronically Signed   By: Inge Rise M.D.   On: 11/30/2017 11:17    Procedures Procedures (including critical care time)  Medications Ordered in ED Medications  sodium chloride 0.9 % bolus 1,000 mL (0 mLs Intravenous Stopped 11/30/17 1315)  prochlorperazine (COMPAZINE) injection 10 mg (10 mg Intravenous Given 11/30/17 1114)  diphenhydrAMINE (BENADRYL) injection 25 mg (25 mg Intravenous Given 11/30/17 1114)     Initial Impression / Assessment and Plan / ED Course  I have reviewed the triage vital signs and the nursing notes.  Pertinent labs & imaging results that were available during my care of the patient were reviewed by me and considered in my medical decision making (see chart for details).  62 year old female with a history of hypertension who presents to the emergency department from UC with a chief complaint of headache, fever, chills, and urinary symptoms.  The patient was discussed with Dr. Billy Fischer, attending physician.  Physical exam is notable for ataxic tandem gait, but no other neurologic deficits.  No  orthostatic hypotension.  Patient was febrile orally at 100.4 at urgent care that resolved after she was given Tylenol.  Vital signs are otherwise reassuring.  UA collected at urgent care was reviewed and remarkable for trace leukocyte esterase, elevated specific gravity, ketonuria, and mild hemoglobinuria, and mild bilirubinuria.  I have spoken with [place], [place] at urgent care, who was added on a urine culture.  Labs are notable for mild elevated neutrophil count of  8.1, but otherwise reassuring.  Head CT is unremarkable.  Patient was given Compazine and Benadryl as well as a bolus of IV fluids.  Clinical Course as of Dec 02 1226  Wed Nov 30, 2017  1309 Patient recheck.  On reevaluation, she reports her headache has completely resolved.  On reevaluation of gait, tandem gait is at baseline without ataxia.  She reports she is feeling much better.   [MM]    Clinical Course User Index [MM] McDonald, Mia A, PA-C   Low suspicion for [facility], CVA, venous cavernous thrombosis, or bacterial meningitis at this time.  Will discharge with Keflex for UTI and recommended follow-up with her PCP for recheck in 3 to 4 days.  Strict return precautions given.  She is hemodynamically stable and in no acute distress.  She is safe for discharge home with outpatient follow-up at this time.   Final Clinical Impressions(s) / ED Diagnoses   Final diagnoses:  Acute cystitis without hematuria    ED Discharge Orders         Ordered    cephALEXin (KEFLEX) 500 MG capsule  2 times daily     11/30/17 1302           McDonald, Mia A, PA-C 12/01/17 1228    Gareth Morgan, MD 12/02/17 1304

## 2017-11-30 NOTE — Discharge Instructions (Addendum)
Thank you for allowing me to care for you today in the Emergency Department.   Please call and schedule follow-up appointment with your primary care provider for recheck in 3 to 4 days.  Take 1 tablet of Keflex 2 times daily for the next 7 days.  This is an antibiotic to treat your urinary symptoms.  Take 600 mg of ibuprofen with food or 650 mg of Tylenol every 6 hours for pain, chills, and fever.  Can also alternate between these 2 medications every 3 if your symptoms return.  Return to the emergency department if you develop new numbness or weakness, a high fever that does not go away despite taking Tylenol and ibuprofen as directed, facial drooping, slurred speech, if you become dizzy or you feel unsteady on your feet, or other new, concerning symptoms.

## 2017-11-30 NOTE — ED Notes (Signed)
Pt to CT

## 2017-11-30 NOTE — ED Provider Notes (Signed)
[facility]   [id number] 11/30/17 Arrival Time: 0808   CC: HEADACHE and urinary frequency  SUBJECTIVE: History from: patient.  Chelsea Landry is a 62 y.o. female hx significant for HTN, who presents with worse HA of life that began 2 days ago.  Denies a precipitating event, trauma, or close sick contacts.  Localizes pain to the top of head and frontal aspect.  Describes pain as constant and 10/10.  Has tried aleve without relief.  Symptoms are made worse with leaning forward.  Denies similar HA in the past.  Complains of associated fatigue, and photophobia.  Denies fever, chills, ear pain, nasal congestion, rhinorrhea, sore throat, SOB, wheezing, chest pain, nausea, vomiting, changes in bowel habits.     Patient also complains of increased urinary frequency that started last night.  She denies a precipitating event.  She denies aggravating or alleviating factors.  She denies similar symptoms in the past.  She complains of decreased urinary amount, and increased urgency.  She denies abdominal pain, flank pain, hematuria, or incontinence  Denies family hx of heart disease or stroke  ROS: As per HPI.  Past Medical History:  Diagnosis Date  . Fibroids   . Frequency of urination   . Hypertension   . Rash    RT LEG  . SVD (spontaneous vaginal delivery)    x 2  . Vaginal lesion    Past Surgical History:  Procedure Laterality Date  . BREATH TEK H PYLORI N/A 04/17/2013   Procedure: BREATH TEK H PYLORI;  Surgeon: Pedro Earls, MD;  Location: [facility];  Service: General;  Laterality: N/A;  . CHOLECYSTECTOMY  2001  . COLONOSCOPY  2006  . DILATATION & CURETTAGE/HYSTEROSCOPY WITH MYOSURE N/A 12/27/2013   Procedure: DILATATION & CURETTAGE/HYSTEROSCOPY WITH MYOSURE;  Surgeon: Crawford Givens, MD;  Location: [facility];  Service: Gynecology;  Laterality: N/A;  . HYSTEROSCOPY    . LAPAROSCOPIC GASTRIC SLEEVE RESECTION N/A 12/31/2013   Procedure: LAPAROSCOPIC GASTRIC SLEEVE RESECTION  w/Upper GI/gen and hiatal hernia repair;  Surgeon: Pedro Earls, MD;  Location: [facility];  Service: General;  Laterality: N/A;  . 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    Current Outpatient Medications on File Prior to Encounter  Medication Sig Dispense Refill  . naproxen (NAPROSYN) 500 MG tablet Take 1 tablet (500 mg total) by mouth 2 (two) times daily with a meal. 10 tablet 0  . amLODipine (NORVASC) 2.5 MG tablet Take 1 tablet (2.5 mg total) by mouth daily. 30 tablet 3  . losartan (COZAAR) 50 MG tablet Take 50 mg by mouth daily.  12  . metoprolol succinate (TOPROL-XL) 25 MG 24 hr tablet Take 1 tablet (25 mg total) by mouth at bedtime. 30 tablet 3  . nitroGLYCERIN (NITROSTAT) 0.4 MG SL tablet Place 1 tablet (0.4 mg total) under the tongue every 5 (five) minutes as needed for chest pain. 25 tablet 1  . omega-3 acid ethyl esters (LOVAZA) 1 g capsule Take 1 g by mouth daily.    . Pediatric Multivit-Minerals-C (FLINTSTONES COMPLETE PO) Take 1 tablet by mouth daily.     Social History   Socioeconomic History  . Marital status: Married    Spouse name: Not on file  . Number of children: Not on file  . Years of education: Not on file  . Highest education level: Not on file  Occupational History  . Not on  file  Social Needs  . Financial resource strain: Not on file  . Food insecurity:    Worry: Not on file    Inability: Not on file  . Transportation needs:    Medical: Not on file    Non-medical: Not on file  Tobacco Use  . Smoking status: Never Smoker  . Smokeless tobacco: Never Used  Substance and Sexual Activity  . Alcohol use: No  . Drug use: No  . Sexual activity: Yes    Birth control/protection: Post-menopausal, Surgical    Comment: husband vasectomy  Lifestyle  . Physical activity:    Days per week: Not on file    Minutes per session: Not on file  . Stress: Not on file    Relationships  . Social connections:    Talks on phone: Not on file    Gets together: Not on file    Attends religious service: Not on file    Active member of club or organization: Not on file    Attends meetings of clubs or organizations: Not on file    Relationship status: Not on file  . Intimate partner violence:    Fear of current or ex partner: Not on file    Emotionally abused: Not on file    Physically abused: Not on file    Forced sexual activity: Not on file  Other Topics Concern  . Not on file  Social History Narrative  . Not on file   Family History  Problem Relation Age of Onset  . Leukemia Mother   . Aneurysm Father   . Cancer Brother        brain  . Hypertension Other   . Breast cancer Neg Hx     OBJECTIVE:  Vitals:   11/30/17 0839  BP: 132/83  Pulse: 84  Resp: 18  Temp: (!) 100.4 F (38 C)  TempSrc: Oral  SpO2: 100%    General appearance: AOx3; appears fatigued HEENT: normocephalic; atraumatic; PERRL; EOMI grossly; EAC clear without otorrhea; TMs pearly gray with visible cone of light; Nose without rhinorrhea; oropharynx clear, dentition intact Neck: supple with FROM Lungs: clear to auscultation bilaterally Heart: RRR without murmur, gallops or rubs Abdomen: soft, nondistended, normal active bowel sounds; nontender to palpation; no guarding Back: No CVA tenderness Extremities: moves all extremities normally; no cyanosis or edema; symmetrical with no gross deformities Skin: warm and dry Neurologic: CN 2-12 grossly intact; ambulates without difficulty; Finger to nose without difficulty, negative pronator drift; strength and sensation intact and symmetrical about the upper and lower extremities Psychological: alert and cooperative; normal mood and affect  LABS:  Results for orders placed or performed during the hospital encounter of 11/30/17 (from the past 24 hour(s))  POCT urinalysis dip (device)     Status: Abnormal   Collection Time: 11/30/17   8:53 AM  Result Value Ref Range   Glucose, UA NEGATIVE NEGATIVE mg/dL   Bilirubin Urine SMALL (A) NEGATIVE   Ketones, ur 80 (A) NEGATIVE mg/dL   Specific Gravity, Urine >=1.030 1.005 - 1.030   Hgb urine dipstick TRACE (A) NEGATIVE   pH 5.5 5.0 - 8.0   Protein, ur NEGATIVE NEGATIVE mg/dL   Urobilinogen, UA 0.2 0.0 - 1.0 mg/dL   Nitrite NEGATIVE NEGATIVE   Leukocytes, UA TRACE (A) NEGATIVE    ASSESSMENT & PLAN:  1. Worst headache of life   2. Urinary frequency     Meds ordered this encounter  Medications  . acetaminophen (TYLENOL) tablet 650 mg  Recommending further evaluation and management in the ED for worst headache of her life.  Patient aware and in agreement with this plan.    Reviewed expectations re: course of current medical issues. Questions answered. Outlined signs and symptoms indicating need for more acute intervention. Patient verbalized understanding. After Visit Summary given.         Lestine Box, PA-C 11/30/17 [id number]

## 2017-12-07 DIAGNOSIS — N390 Urinary tract infection, site not specified: Secondary | ICD-10-CM | POA: Diagnosis not present

## 2017-12-07 DIAGNOSIS — R55 Syncope and collapse: Secondary | ICD-10-CM | POA: Diagnosis not present

## 2017-12-07 DIAGNOSIS — M533 Sacrococcygeal disorders, not elsewhere classified: Secondary | ICD-10-CM | POA: Diagnosis not present

## 2017-12-14 ENCOUNTER — Other Ambulatory Visit: Payer: Self-pay | Admitting: Family Medicine

## 2017-12-14 DIAGNOSIS — R55 Syncope and collapse: Secondary | ICD-10-CM

## 2017-12-26 ENCOUNTER — Ambulatory Visit
Admission: RE | Admit: 2017-12-26 | Discharge: 2017-12-26 | Disposition: A | Discharge status: Home / Self Care | Payer: 59 | Source: Ambulatory Visit | Attending: Family Medicine | Admitting: Family Medicine

## 2017-12-26 DIAGNOSIS — R51 Headache: Secondary | ICD-10-CM | POA: Diagnosis not present

## 2017-12-26 DIAGNOSIS — R55 Syncope and collapse: Secondary | ICD-10-CM | POA: Diagnosis not present

## 2018-01-05 DIAGNOSIS — I10 Essential (primary) hypertension: Secondary | ICD-10-CM | POA: Diagnosis not present

## 2018-01-05 DIAGNOSIS — Z0000 Encounter for general adult medical examination without abnormal findings: Secondary | ICD-10-CM | POA: Diagnosis not present

## 2018-01-05 DIAGNOSIS — R7301 Impaired fasting glucose: Secondary | ICD-10-CM | POA: Diagnosis not present

## 2018-02-09 DIAGNOSIS — M25562 Pain in left knee: Secondary | ICD-10-CM | POA: Diagnosis not present

## 2018-03-06 DIAGNOSIS — M25562 Pain in left knee: Secondary | ICD-10-CM | POA: Diagnosis not present

## 2018-03-06 DIAGNOSIS — M2242 Chondromalacia patellae, left knee: Secondary | ICD-10-CM | POA: Insufficient documentation

## 2018-03-09 DIAGNOSIS — M25562 Pain in left knee: Secondary | ICD-10-CM | POA: Diagnosis not present

## 2018-03-15 DIAGNOSIS — M25562 Pain in left knee: Secondary | ICD-10-CM | POA: Diagnosis not present

## 2018-03-17 DIAGNOSIS — Z9884 Bariatric surgery status: Secondary | ICD-10-CM | POA: Diagnosis not present

## 2018-03-20 DIAGNOSIS — M25562 Pain in left knee: Secondary | ICD-10-CM | POA: Diagnosis not present

## 2018-03-27 DIAGNOSIS — M25562 Pain in left knee: Secondary | ICD-10-CM | POA: Diagnosis not present

## 2018-04-03 ENCOUNTER — Ambulatory Visit: Payer: 59 | Admitting: Podiatrist

## 2018-04-03 ENCOUNTER — Encounter: Payer: Self-pay | Admitting: Podiatrist

## 2018-04-03 DIAGNOSIS — M79676 Pain in unspecified toe(s): Secondary | ICD-10-CM

## 2018-04-03 DIAGNOSIS — N950 Postmenopausal bleeding: Secondary | ICD-10-CM | POA: Insufficient documentation

## 2018-04-03 DIAGNOSIS — L84 Corns and callosities: Secondary | ICD-10-CM | POA: Diagnosis not present

## 2018-04-03 DIAGNOSIS — B351 Tinea unguium: Secondary | ICD-10-CM

## 2018-04-03 DIAGNOSIS — N8501 Benign endometrial hyperplasia: Secondary | ICD-10-CM | POA: Insufficient documentation

## 2018-04-03 DIAGNOSIS — M25562 Pain in left knee: Secondary | ICD-10-CM | POA: Diagnosis not present

## 2018-04-03 NOTE — Progress Notes (Signed)
   Chief Complaint  Patient presents with  . Nail Problem    Left 1st nail grows thick and is painful  . Callouses    Right foot plantar callouses that are painful  . Foot Problem    Ankle swelling unknown duration     HPI: Patient is 63 y.o. female who presents today for a painful and thick left hallux nail, calluses on the right foot, and ankle swelling.  She denies any trauma or injury to the foot.   No Known Allergies  Review of systems is reviewed and negative.   Physical Exam  Patient is awake, alert, and oriented x 3.  In no acute distress.    Vascular status is intact with palpable pedal pulses DP and PT bilateral and capillary refill time less than 3 seconds bilateral.  No edema or erythema noted.  Neurological exam reveals epicritic and protective sensation grossly intact bilateral.  Dermatological exam reveals skin is supple and dry to bilateral feet.  No open lesions present.  Digital corns present laterally on the fifth digit and hallux of the right foot.  These are painful and symptomatic.  Left hallux nail is thickened and clinically mycotic.  Incurvation of the nail borders is also noted.  Pain with pressure is noted.  Musculoskeletal exam: Musculature intact with dorsiflexion, plantarflexion, inversion, eversion. Ankle and First MPJ joint range of motion normal.  Discomfort in the medial arch is noted regular than left.  Adductovarus rotation bilateral fifth digits present.  Lipomas near the sinus tarsi region of bilateral ankles are present.    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  Also discussed replacing her old inserts to wearing her steel toe shoes she will consider this and let us know if she like to proceed.

## 2018-04-03 NOTE — Patient Instructions (Signed)
Look up "  Toenail brace "  On amazon   Or google to find out which brace may straignten the toenail  "curve correct toenail brace" is the one I was thinking of.

## 2018-04-07 DIAGNOSIS — M25562 Pain in left knee: Secondary | ICD-10-CM | POA: Diagnosis not present

## 2018-04-12 DIAGNOSIS — M25562 Pain in left knee: Secondary | ICD-10-CM | POA: Diagnosis not present

## 2018-04-12 DIAGNOSIS — M2242 Chondromalacia patellae, left knee: Secondary | ICD-10-CM | POA: Diagnosis not present

## 2018-10-03 ENCOUNTER — Other Ambulatory Visit: Payer: Self-pay | Admitting: Obstetrics and Gynecology

## 2018-10-03 DIAGNOSIS — Z1231 Encounter for screening mammogram for malignant neoplasm of breast: Secondary | ICD-10-CM

## 2018-11-13 ENCOUNTER — Other Ambulatory Visit: Payer: Self-pay

## 2018-11-13 ENCOUNTER — Ambulatory Visit
Admission: RE | Admit: 2018-11-13 | Discharge: 2018-11-13 | Disposition: A | Discharge status: Home / Self Care | Payer: 59 | Source: Ambulatory Visit | Attending: Obstetrics and Gynecology | Admitting: Obstetrics and Gynecology

## 2018-11-13 DIAGNOSIS — Z1231 Encounter for screening mammogram for malignant neoplasm of breast: Secondary | ICD-10-CM

## 2019-02-17 ENCOUNTER — Other Ambulatory Visit: Payer: Self-pay | Admitting: Family Medicine

## 2019-02-17 DIAGNOSIS — R1013 Epigastric pain: Secondary | ICD-10-CM

## 2019-02-26 ENCOUNTER — Ambulatory Visit
Admission: RE | Admit: 2019-02-26 | Discharge: 2019-02-26 | Disposition: A | Discharge status: Home / Self Care | Payer: 59 | Source: Ambulatory Visit | Attending: Family Medicine | Admitting: Family Medicine

## 2019-02-26 DIAGNOSIS — R1013 Epigastric pain: Secondary | ICD-10-CM

## 2019-02-26 MED ORDER — IOPAMIDOL (ISOVUE-300) INJECTION 61%
100.0000 mL | Freq: Once | INTRAVENOUS | Status: AC | PRN
  Administered 2019-02-26: 100 mL via INTRAVENOUS

## 2019-03-29 ENCOUNTER — Ambulatory Visit: Payer: 59 | Attending: Family

## 2019-03-29 DIAGNOSIS — Z23 Encounter for immunization: Secondary | ICD-10-CM | POA: Insufficient documentation

## 2019-03-29 NOTE — Progress Notes (Signed)
   Covid-19 Vaccination Clinic  Name:  Chelsea Landry    MRN: [id number] DOB: 1955/09/23  03/29/2019  [name] was observed post Covid-19 immunization for 15 minutes without incident. She was provided with Vaccine Information Sheet and instruction to access the V-Safe system.   [name] was instructed to call 911 with any severe reactions post vaccine: Marland Kitchen Difficulty breathing  . Swelling of face and throat  . A fast heartbeat  . A bad rash all over body  . Dizziness and weakness   Immunizations Administered    Name Date Dose VIS Date Route   Moderna COVID-19 Vaccine 03/29/2019 12:52 PM 0.5 mL 12/26/2018 Intramuscular   Manufacturer: Moderna   Lot: [id number]   [id number]

## 2019-05-01 ENCOUNTER — Ambulatory Visit: Payer: 59 | Attending: Family

## 2019-05-01 DIAGNOSIS — Z23 Encounter for immunization: Secondary | ICD-10-CM

## 2019-05-01 NOTE — Progress Notes (Signed)
   Covid-19 Vaccination Clinic  Name:  Chelsea Landry    MRN: [id number] DOB: [birth year]  05/01/2019  [name] was observed post Covid-19 immunization for 15 minutes without incident. She was provided with Vaccine Information Sheet and instruction to access the V-Safe system.   Ms. Leak was instructed to call 911 with any severe reactions post vaccine: Marland Kitchen Difficulty breathing  . Swelling of face and throat  . A fast heartbeat  . A bad rash all over body  . Dizziness and weakness   Immunizations Administered    Name Date Dose VIS Date Route   Moderna COVID-19 Vaccine 05/01/2019  9:09 AM 0.5 mL 12/26/2018 Intramuscular   Manufacturer: Moderna   Lot: [id number]   [id number]

## 2019-08-13 DIAGNOSIS — Z01419 Encounter for gynecological examination (general) (routine) without abnormal findings: Secondary | ICD-10-CM | POA: Diagnosis not present

## 2019-08-13 DIAGNOSIS — Z1211 Encounter for screening for malignant neoplasm of colon: Secondary | ICD-10-CM | POA: Diagnosis not present

## 2019-08-13 DIAGNOSIS — R278 Other lack of coordination: Secondary | ICD-10-CM | POA: Diagnosis not present

## 2019-10-11 ENCOUNTER — Ambulatory Visit: Payer: Medicare HMO | Admitting: Neurology

## 2019-10-11 ENCOUNTER — Encounter: Payer: Self-pay | Admitting: Neurology

## 2019-10-11 ENCOUNTER — Telehealth: Payer: Self-pay | Admitting: Neurology

## 2019-10-11 ENCOUNTER — Other Ambulatory Visit: Payer: Self-pay

## 2019-10-11 VITALS — BP 118/72 | HR 72 | Ht 62.0 in | Wt 177.3 lb

## 2019-10-11 DIAGNOSIS — R2689 Other abnormalities of gait and mobility: Secondary | ICD-10-CM | POA: Diagnosis not present

## 2019-10-11 DIAGNOSIS — R0683 Snoring: Secondary | ICD-10-CM

## 2019-10-11 DIAGNOSIS — R519 Headache, unspecified: Secondary | ICD-10-CM

## 2019-10-11 DIAGNOSIS — G4700 Insomnia, unspecified: Secondary | ICD-10-CM

## 2019-10-11 DIAGNOSIS — R351 Nocturia: Secondary | ICD-10-CM

## 2019-10-11 DIAGNOSIS — G479 Sleep disorder, unspecified: Secondary | ICD-10-CM | POA: Diagnosis not present

## 2019-10-11 DIAGNOSIS — Z9181 History of falling: Secondary | ICD-10-CM

## 2019-10-11 DIAGNOSIS — Z8489 Family history of other specified conditions: Secondary | ICD-10-CM | POA: Diagnosis not present

## 2019-10-11 DIAGNOSIS — E669 Obesity, unspecified: Secondary | ICD-10-CM | POA: Diagnosis not present

## 2019-10-11 NOTE — Patient Instructions (Signed)
You have a nonspecific slight insecurity with your balance, otherwise good examination, good coordination, good muscle strength, good reflexes, no numbness.  No evidence of parkinsonism.  No tremors.  I am overall quite pleased with your examination.  Nevertheless, due to the fact that you have fallen a few times and you have issues with your balance, I would like to proceed with a brain MRI with and without contrast to rule out a structural cause of his symptoms, also in light of your family history of brain cancer.  In addition, for your sleep disturbance including difficulty maintaining sleep, history of snoring and the fact that you do have a somewhat crowded appearing airway, I would like to exclude obstructive sleep apnea.  This will require a sleep test.  We will talk to your insurance about authorization for your brain MRI and call you to schedule this as well as your sleep study to schedule this here in our sleep lab if possible.  If your sleep study reveals that you do have obstructive sleep apnea, I may want you to try treatment for this in the form of a CPAP or CPAP like machine called AutoPap.  As discussed sleep apnea can increase the risk for vascular complications including heart disease, stroke, dementia risk, and irregular heartbeat risk, congestive heart failure.  And sleep apnea can also cause symptoms such as headaches, sleep disruption, daytime tiredness.

## 2019-10-11 NOTE — Progress Notes (Signed)
Subjective:    Patient ID: Chelsea Landry is a 64 y.o. female.  HPI    Star Age, MD, PhD [facility] [address], [address].O. [place], [place] [address]  Dear Dr. Charlesetta Garibaldi,   I saw your patient, Chelsea Landry, upon your kind request, in my Neurologic clinic today for initial consultation of her balance disorder and incoordination.  The patient is unaccompanied today.  As you know, Chelsea Landry is a 64 year old right-handed woman with an underlying medical history of hypertension, anxiety, and obesity, who reports difficulty with her balance for the past several months.  She feels a nonspecific insecurity when walking.  She has fallen.   She reports that in the springtime when she was outside with her children and they were all trying to walk in a straight line, she had notable difficulty with it, her children wanted her to get checked out.  She has been exercising with the class about 3 [facility] a [facility] the senior center and has found it helpful.  She had a syncopal spell nearly 2 years ago, she remembers that she was in the kitchen and felt suddenly lightheaded and passed out.  She reports that she fell in June when she exited a restaurant and reports that she did not see the curb and fell forward, had an abrasion to the right knee.  She also fell recently in her backyard, did not injure herself, reports that she was wearing flip-flops and tripped over her own shoes.  She has not noticed any tremors.  She had no sudden onset of symptoms. I reviewed your office records including your note from 08/13/2019.  She had blood work through your office at the time.  She had a brain MRI without contrast for indication of headache and syncope on 12/26/2017 and I reviewed the results: IMPRESSION: 1.  No acute intracranial abnormality. 2. Mild for age nonspecific cerebral white matter signal changes, most commonly due to chronic small vessel disease.  She does not sleep  very well.  She has difficulty initiating and maintaining sleep.  She sleeps with the TV on, typically in bed by 930 but watches TV until approximately 1130 or midnight.  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  She does snore which is noted by her children.  She lives with her husband.  She has nocturia about once per average night and has woken up with a headache in the mornings at times.  Her Epworth sleepiness score is 3 out of 24.  She has never had a sleep study.  She does not drink alcohol, she is a non-smoker, she does not drink caffeine on a day-to-day basis.  She tries to hydrate well but admits that she could drink a little bit more water, averaging about 40 ounces of water per day per her estimate.  She denies a family history of stroke or Parkinson's disease but her brother had brain cancer.  She does not know the type.  No family history of brain aneurysm.  She has no significant recurrent headaches.  She has not had any sudden onset of one-sided weakness or numbness or tingling or droopy face or slurring of speech.    Her Past Medical History Is Significant For: Past Medical History:  Diagnosis Date  . Fibroids   . Frequency of urination   . Hypertension   . Rash    RT LEG  . SVD (spontaneous  vaginal delivery)    x 2  . Vaginal lesion     Her Past Surgical History Is Significant For: Past Surgical History:  Procedure Laterality Date  . BREATH TEK H PYLORI N/A 04/17/2013   Procedure: BREATH TEK H PYLORI;  Surgeon: Pedro Earls, MD;  Location: [facility];  Service: General;  Laterality: N/A;  . CHOLECYSTECTOMY  2001  . COLONOSCOPY  2006  . DILATATION & CURETTAGE/HYSTEROSCOPY WITH MYOSURE N/A 12/27/2013   Procedure: DILATATION & CURETTAGE/HYSTEROSCOPY WITH MYOSURE;  Surgeon: Crawford Givens, MD;  Location: [facility];  Service: Gynecology;  Laterality: N/A;  . HYSTEROSCOPY    . LAPAROSCOPIC GASTRIC SLEEVE RESECTION N/A  12/31/2013   Procedure: LAPAROSCOPIC GASTRIC SLEEVE RESECTION w/Upper GI/gen and hiatal hernia repair;  Surgeon: Pedro Earls, MD;  Location: [facility];  Service: General;  Laterality: N/A;  . 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 Leukemia Mother   . Aneurysm Father   . Cancer Brother        brain  . Hypertension Other   . Breast cancer Neg Hx     Her Social History Is Significant For: Social History   Socioeconomic History  . Marital status: Married    Spouse name: Not on file  . Number of children: Not on file  . Years of education: Not on file  . Highest education level: Not on file  Occupational History  . Not on file  Tobacco Use  . Smoking status: Never Smoker  . Smokeless tobacco: Never Used  Substance and Sexual Activity  . Alcohol use: No  . Drug use: No  . Sexual activity: Yes    Birth control/protection: Post-menopausal, Surgical    Comment: husband vasectomy  Other Topics Concern  . Not on file  Social History Narrative  . Not on file   Social Determinants of Health   Financial Resource Strain:   . Difficulty of Paying Living Expenses: Not on file  Food Insecurity:   . Worried About Charity fundraiser in the Last Year: Not on file  . Ran Out of Food in the Last Year: Not on file  Transportation Needs:   . Lack of Transportation (Medical): Not on file  . Lack of Transportation (Non-Medical): Not on file  Physical Activity:   . Days of Exercise per Week: Not on file  . Minutes of Exercise per Session: Not on file  Stress:   . Feeling of Stress : Not on file  Social Connections:   . Frequency of Communication with Friends and Family: Not on file  . Frequency of Social Gatherings with Friends and Family: Not on file  . Attends Religious Services: Not on file  . Active Member of Clubs or Organizations: Not on file  . Attends Archivist Meetings: Not  on file  . Marital Status: Not on file    Her Allergies Are:  No Known Allergies:   Her Current Medications Are:  Outpatient Encounter Medications as of 10/11/2019  Medication Sig  . losartan (COZAAR) 50 MG tablet Take 50 mg by mouth daily.  . metoprolol succinate (TOPROL-XL) 25 MG 24 hr tablet Take 1 tablet (25 mg total) by mouth at bedtime.  . Multiple Vitamin (MULTIVITAMIN WITH MINERALS) TABS tablet Take 1 tablet by mouth daily.  Marland Kitchen VITAMIN D PO Take by mouth.  . [DISCONTINUED] amLODipine (NORVASC) 2.5 MG tablet  Take 1 tablet (2.5 mg total) by mouth daily.  . [DISCONTINUED] augmented betamethasone dipropionate (DIPROLENE-AF) 0.05 % cream betamethasone, augmented 0.05 % topical cream  . [DISCONTINUED] cephALEXin (KEFLEX) 500 MG capsule cephalexin 500 mg capsule  . [DISCONTINUED] cyclobenzaprine (FLEXERIL) 10 MG tablet cyclobenzaprine 10 mg tablet  . [DISCONTINUED] diclofenac (VOLTAREN) 75 MG EC tablet diclofenac sodium 75 mg tablet,delayed release  . [DISCONTINUED] diclofenac sodium (VOLTAREN) 1 % GEL diclofenac 1 % topical gel  APPLY 2 GRAMS TO THE AFFECTED AREA(S) 4 TIMES PER DAY  . [DISCONTINUED] fluconazole (DIFLUCAN) 150 MG tablet fluconazole 150 mg tablet  . [DISCONTINUED] HYDROcodone-acetaminophen (NORCO/VICODIN) 5-325 MG tablet hydrocodone 5 mg-acetaminophen 325 mg tablet  . [DISCONTINUED] meloxicam (MOBIC) 15 MG tablet meloxicam 15 mg tablet  . [DISCONTINUED] nabumetone (RELAFEN) 500 MG tablet nabumetone 500 mg tablet  . [DISCONTINUED] naproxen (NAPROSYN) 500 MG tablet Take 1 tablet (500 mg total) by mouth 2 (two) times daily with a meal.  . [DISCONTINUED] nitroGLYCERIN (NITROSTAT) 0.4 MG SL tablet Place 1 tablet (0.4 mg total) under the tongue every 5 (five) minutes as needed for chest pain.  . [DISCONTINUED] omega-3 acid ethyl esters (LOVAZA) 1 g capsule Take 1 g by mouth daily.  . [DISCONTINUED] Pediatric Multivit-Minerals-C (FLINTSTONES COMPLETE PO) Take 1 tablet by mouth  daily.   No facility-administered encounter medications on file as of 10/11/2019.  :   Review of Systems:  Out of a complete 14 point review of systems, all are reviewed and negative with the exception of these symptoms as listed below:  Review of Systems  Neurological:       Pt reports she is here at the request of her OBGYN. She sts back in the early spring she and her kids were outside and she was unable to put one foot in front of the other.  She sts since then she has been able to preform this task but feels off balance when doing so. OBGNY thought it would be beneficial for neuro consult.      Objective:  Neurological Exam  Physical Exam Physical Examination:   Vitals:   10/11/19 0957  BP: 118/72  Pulse: 72  SpO2: 97%    General Examination: The patient is a very pleasant 64 y.o. female in no acute distress. She appears well-developed and well-nourished and well groomed.   HEENT: Normocephalic, atraumatic, pupils are equal, round and reactive to light and accommodation. Funduscopic exam is normal with sharp disc margins noted. Extraocular tracking is good without limitation to gaze excursion or nystagmus noted. Normal smooth pursuit is noted. Hearing is grossly intact. Tympanic membranes are clear bilaterally. Face is symmetric with normal facial animation and normal facial sensation. Speech is clear with no dysarthria noted. There is no hypophonia. There is no lip, neck/head, jaw or voice tremor. Neck is supple with full range of passive and active motion. There are no carotid bruits on auscultation. Oropharynx exam reveals: mild mouth dryness, good dental hygiene and moderate airway crowding, due to small airway entry and redundant soft palate.  Tonsillar size is about 1+.  Tongue protrudes centrally in palate elevates symmetrically.  Chest: Clear to auscultation without wheezing, rhonchi or crackles noted.  Heart: S1+S2+0, regular and normal without murmurs, rubs or gallops  noted.   Abdomen: Soft, non-tender and non-distended with normal bowel sounds appreciated on auscultation.  Extremities: There is no pitting edema in the distal lower extremities bilaterally. Pedal pulses are intact.  Skin: Warm and dry without trophic changes noted.  Musculoskeletal: exam  reveals no obvious joint deformities, tenderness or joint swelling or erythema.   Neurologically:  Mental status: The patient is awake, alert and oriented in all 4 spheres. Her immediate and remote memory, attention, language skills and fund of knowledge are appropriate. There is no evidence of aphasia, agnosia, apraxia or anomia. Speech is clear with normal prosody and enunciation. Thought process is linear. Mood is normal and affect is normal.  Cranial nerves II - XII are as described above under HEENT exam. In addition: shoulder shrug is normal with equal shoulder height noted. Motor exam: Normal bulk, strength and tone is noted. There is no drift, tremor or rebound. Romberg is negative. Reflexes are 2+ throughout. Babinski: Toes are flexor bilaterally. Fine motor skills and coordination: intact with normal finger taps, normal hand movements, normal rapid alternating patting, normal foot taps and normal foot agility.  Cerebellar testing: No dysmetria or intention tremor on finger to nose testing. Heel to shin is unremarkable bilaterally. There is no truncal or gait ataxia.  Sensory exam: intact to light touch, vibration, temperature sense in the upper and lower extremities.  Gait, station and balance: She stands easily. No veering to one side is noted. No leaning to one side is noted. Posture is age-appropriate and stance is narrow based. Gait shows normal stride length and normal pace. No problems turning are noted. Tandem walk is somewhat challenging for her.  Preserved arm swing, no shuffling.  Assessment and plan:  Assessment and Plan:  In summary, Chelsea Landry is a very pleasant 64 y.o.-year old  female with an underlying medical history of hypertension, anxiety, and obesity, who presents for evaluation of her balance problem of several months duration with slight improvement recently.  She has fallen at least 2 times in the recent 6 months.  She also has a history of syncopal spell some 2 years ago.  Examination is nonfocal with a nonspecific insecurity with her tandem walk but no cerebellar signs, no telltale ataxia, no sensory deficit, no incoordination with the upper or lower extremities, overall fairly benign neurological exam.  She certainly does not have any evidence of parkinsonism.  I think her balance disorder is probably due to a combination of possible factors including suboptimal hydration, low normal blood pressure values with prior history of syncope and fall.  I would like to proceed with a brain MRI with and without contrast to rule out a structural cause of her symptoms.  Also, given her brother's history of brain cancer, I would like to get a updated brain MRI done.  She has no clear-cut evidence of a primary underlying neurological condition and she is largely reassured.  Given her history of sleep disturbance, and moderate airway crowding noted as well as history of snoring.  I would like to also proceed with a sleep study to rule out underlying obstructive sleep apnea.  I explained to her that sleep apnea can cause daytime symptoms including more nonspecific symptoms such as dizziness even.  We have seen a connection between sleep apnea and dizziness at times although she does not have any vertiginous or dizziness symptoms per se just a insecurity with her balance.  She is encouraged to continue with her exercise program.  She is encouraged to stay better hydrated with water.  She agrees that she could do a little bit more with her fluid intake.  If she has obstructive sleep apnea, she is advised that she may benefit from using a CPAP or AutoPap machine.  We will  pick up our discussion  once we have the sleep study results.  We will also keep her posted as to her brain MRI results by phone call.  We will plan a follow-up after testing, largely also dependent on the test results.  I answered all her questions today and she was in agreement with the plan.   Thank you very much for allowing me to participate in the care of this nice patient. If I can be of any further assistance to you please do not hesitate to call me at [phone].  Sincerely,   Star Age, MD, PhD

## 2019-10-11 NOTE — Telephone Encounter (Signed)
humana pending.

## 2019-10-12 LAB — COMPREHENSIVE METABOLIC PANEL
ALT: 34 IU/L — ABNORMAL HIGH (ref 0–32)
AST: 36 IU/L (ref 0–40)
Albumin/Globulin Ratio: 1.7 (ref 1.2–2.2)
Albumin: 4.2 g/dL (ref 3.8–4.8)
Alkaline Phosphatase: 61 IU/L (ref 44–121)
BUN/Creatinine Ratio: 29 — ABNORMAL HIGH (ref 12–28)
BUN: 28 mg/dL — ABNORMAL HIGH (ref 8–27)
Bilirubin Total: 0.5 mg/dL (ref 0.0–1.2)
CO2: 23 mmol/L (ref 20–29)
Calcium: 9.6 mg/dL (ref 8.7–10.3)
Chloride: 103 mmol/L (ref 96–106)
Creatinine, Ser: 0.98 mg/dL (ref 0.57–1.00)
GFR calc Af Amer: 70 mL/min/{1.73_m2} (ref >59)
GFR calc non Af Amer: 61 mL/min/{1.73_m2} (ref >59)
Globulin, Total: 2.5 g/dL (ref 1.5–4.5)
Glucose: 88 mg/dL (ref 65–99)
Potassium: 4.4 mmol/L (ref 3.5–5.2)
Sodium: 139 mmol/L (ref 134–144)
Total Protein: 6.7 g/dL (ref 6.0–8.5)

## 2019-10-15 NOTE — Telephone Encounter (Signed)
Mcarthur Rossetti Josem Kaufmann: [id number] (exp. 10/11/19 to 11/10/19) order sent to GI. They will reach out to the patient to schedule.

## 2019-10-17 ENCOUNTER — Telehealth: Payer: Self-pay

## 2019-10-17 NOTE — Telephone Encounter (Signed)
-----   Message from Star Age, MD sent at 10/17/2019 10:52 AM EDT ----- Please advise patient that her labs were fine with the exception of some borderline changes including slight increase in BUN at 28, normal cutoff is 27.  This can indicate suboptimal hydration.  Also, her liver enzyme ALT was slightly above normal at 34, cutoff is 32.  She has had intermittent mild increase in ALT before, some 5 years ago it was 36 so nothing concerning at this time.  We will proceed with her brain MRI with and without contrast as scheduled.

## 2019-10-17 NOTE — Telephone Encounter (Signed)
Pt notified of results and verbalized understanding  

## 2019-10-17 NOTE — Progress Notes (Signed)
Please advise patient that her labs were fine with the exception of some borderline changes including slight increase in BUN at 28, normal cutoff is 27.  This can indicate suboptimal hydration.  Also, her liver enzyme ALT was slightly above normal at 34, cutoff is 32.  She has had intermittent mild increase in ALT before, some 5 years ago it was 36 so nothing concerning at this time.  We will proceed with her brain MRI with and without contrast as scheduled.

## 2019-10-29 ENCOUNTER — Other Ambulatory Visit: Payer: Self-pay

## 2019-10-29 ENCOUNTER — Ambulatory Visit
Admission: RE | Admit: 2019-10-29 | Discharge: 2019-10-29 | Disposition: A | Discharge status: Home / Self Care | Payer: Medicare HMO | Source: Ambulatory Visit | Attending: Neurology | Admitting: Neurology

## 2019-10-29 DIAGNOSIS — Z8489 Family history of other specified conditions: Secondary | ICD-10-CM

## 2019-10-29 DIAGNOSIS — R2689 Other abnormalities of gait and mobility: Secondary | ICD-10-CM

## 2019-10-29 DIAGNOSIS — R519 Headache, unspecified: Secondary | ICD-10-CM

## 2019-10-29 DIAGNOSIS — R0683 Snoring: Secondary | ICD-10-CM | POA: Diagnosis not present

## 2019-10-29 DIAGNOSIS — E669 Obesity, unspecified: Secondary | ICD-10-CM

## 2019-10-29 DIAGNOSIS — G4700 Insomnia, unspecified: Secondary | ICD-10-CM

## 2019-10-29 DIAGNOSIS — Z9181 History of falling: Secondary | ICD-10-CM | POA: Diagnosis not present

## 2019-10-29 DIAGNOSIS — R351 Nocturia: Secondary | ICD-10-CM

## 2019-10-29 MED ORDER — GADOBENATE DIMEGLUMINE 529 MG/ML IV SOLN
15.0000 mL | Freq: Once | INTRAVENOUS | Status: AC | PRN
  Administered 2019-10-29: 15 mL via INTRAVENOUS

## 2019-10-29 NOTE — Progress Notes (Signed)
Please call patient and advise her that her recent brain MRI with and without contrast did not show any acute findings.  Chronic findings with fairly stable changes compared to 2019 were seen.  No acute findings and no abnormal contrast uptake.

## 2019-10-30 ENCOUNTER — Telehealth: Payer: Self-pay

## 2019-10-30 NOTE — Telephone Encounter (Signed)
-----   Message from Star Age, MD sent at 10/29/2019  5:48 PM EDT ----- Please call patient and advise her that her recent brain MRI with and without contrast did not show any acute findings.  Chronic findings with fairly stable changes compared to 2019 were seen.  No acute findings and no abnormal contrast uptake.

## 2019-10-30 NOTE — Telephone Encounter (Signed)
  I called the pt and advised of result. She verbalized understanding and appreciation for the call.

## 2019-11-01 ENCOUNTER — Other Ambulatory Visit: Payer: Self-pay | Admitting: Obstetrics and Gynecology

## 2019-11-01 DIAGNOSIS — Z1231 Encounter for screening mammogram for malignant neoplasm of breast: Secondary | ICD-10-CM

## 2019-11-06 ENCOUNTER — Other Ambulatory Visit: Payer: 59

## 2019-11-07 ENCOUNTER — Ambulatory Visit (INDEPENDENT_AMBULATORY_CARE_PROVIDER_SITE_OTHER): Payer: Medicare HMO | Admitting: Neurology

## 2019-11-07 DIAGNOSIS — R2689 Other abnormalities of gait and mobility: Secondary | ICD-10-CM

## 2019-11-07 DIAGNOSIS — Z9181 History of falling: Secondary | ICD-10-CM

## 2019-11-07 DIAGNOSIS — R0683 Snoring: Secondary | ICD-10-CM

## 2019-11-07 DIAGNOSIS — E669 Obesity, unspecified: Secondary | ICD-10-CM

## 2019-11-07 DIAGNOSIS — Z8489 Family history of other specified conditions: Secondary | ICD-10-CM

## 2019-11-07 DIAGNOSIS — G4733 Obstructive sleep apnea (adult) (pediatric): Secondary | ICD-10-CM | POA: Diagnosis not present

## 2019-11-07 DIAGNOSIS — G4700 Insomnia, unspecified: Secondary | ICD-10-CM

## 2019-11-07 DIAGNOSIS — R351 Nocturia: Secondary | ICD-10-CM

## 2019-11-07 DIAGNOSIS — R519 Headache, unspecified: Secondary | ICD-10-CM

## 2019-11-14 ENCOUNTER — Ambulatory Visit
Admission: RE | Admit: 2019-11-14 | Discharge: 2019-11-14 | Disposition: A | Discharge status: Home / Self Care | Payer: Medicare HMO | Source: Ambulatory Visit | Attending: Obstetrics and Gynecology | Admitting: Obstetrics and Gynecology

## 2019-11-14 ENCOUNTER — Other Ambulatory Visit: Payer: Self-pay

## 2019-11-14 ENCOUNTER — Telehealth: Payer: Self-pay

## 2019-11-14 DIAGNOSIS — Z1231 Encounter for screening mammogram for malignant neoplasm of breast: Secondary | ICD-10-CM

## 2019-11-14 NOTE — Addendum Note (Signed)
Addended by: Star Age on: 11/14/2019 02:31 PM   Modules accepted: Orders

## 2019-11-14 NOTE — Telephone Encounter (Signed)
Noted, thank you

## 2019-11-14 NOTE — Procedures (Signed)
Sleep Study Report   Patient Information     First Name: Chelsea Last Name: Camari Landry: [id number]  Birth Date: 11-29-2055 Age: 64 Gender: Female  Referring Provider: Hulan Fess, MD BMI: 32.5 (W=176 lb, H=5' 2'')    Epworth:  3/24   Sleep Study Information    Study Date: 11/07/19 S/H/A Version: [id number] / 4.2.1023 / 14  History:    64 year old woman with a history of hypertension, anxiety, and obesity, who reports difficulty with her balance. She also reports snoring, nocturia and morning headaches.  Summary & Diagnosis:    Mild OSA Recommendations:     This home sleep test demonstrates overall mild obstructive sleep apnea (by number of events) with a total AHI of 14/hour and O2 nadir of 90%. Snoring was noted, and appeared to be mild and intermittent. Given the patient's medical history and sleep related complaints, treatment with positive airway pressure is a reasonable choice and can be achieved in the form of autoPAP trial/titration at home. Alternative treatments include weight loss along with avoidance of the supine sleep position, or an oral appliance in appropriate candidates.   Please note that untreated obstructive sleep apnea may carry additional perioperative morbidity. Patients with significant obstructive sleep apnea should receive perioperative PAP therapy and the surgeons and particularly the anesthesiologist should be informed of the diagnosis and the severity of the sleep disordered breathing. The patient should be cautioned not to drive, work at heights, or operate dangerous or heavy equipment when tired or sleepy. Review and reiteration of good sleep hygiene measures should be pursued with any patient. Other causes of the patient's symptoms, including circadian rhythm disturbances, an underlying mood disorder, medication effect and/or an underlying medical problem cannot be ruled out based on this test. Clinical correlation is recommended.   The patient and her referring  provider will be notified of the test results. The patient will be seen in follow up in sleep clinic at [facility].  I certify that I have reviewed the raw data recording prior to the issuance of this report in accordance with the standards of the American Academy of Sleep Medicine (AASM).  Star Age, MD, PhD [facility]) Diplomat, ABPN (Neurology and Sleep)          Sleep Summary  Oxygen Saturation Statistics   Start Study Time: End Study Time: Total Recording Time:          11:07:06 PM 7:58:14 AM   8 h, 51 min  Total Sleep Time % REM of Sleep Time:  7 h, 47 min  18.8    Mean: 94 Minimum: 90 Maximum: 99  Mean of Desaturations Nadirs (%):   92  Oxygen Desaturation. %: 4-9 10-20 >20 Total  Events Number Total  20 100.0  0 0.0  0 0.0  20 100.0  Oxygen Saturation: <90 <=88 <85 <80 <70  Duration (minutes): Sleep % 0.0 0.0 0.0 0.0 0.0 0.0 0.0 0.0 0.0 0.0     Respiratory Indices      Total Events REM NREM All Night  pRDI: pAHI 3%: ODI 4%: pAHIc 3%: % CSR: pAHI 4%:  116  100  20  5 0.0 24 20.6 16.0 8.4 0.8 15.3 13.6 1.6 0.7 16.3 14.0 2.8 0.7 3.4       Pulse Rate Statistics during Sleep (BPM)      Mean: 57 Minimum: 45 Maximum: 87    Indices are calculated using technically valid sleep time of 7 h, 7 min.  pAHI=14.0                                   Mild              Moderate                    Severe                                                 5              15                    30   Body Position Statistics  Position Supine Prone Right Left Non-Supine  Sleep (min) 365.0 0.0 0.0 102.5 102.5  Sleep % 78.1 0.0 0.0 21.9 21.9  pRDI 16.8 N/A N/A 13.6 13.6  pAHI 3% 15.0 N/A N/A 9.3 9.3  ODI 4% 3.2 N/A N/A 0.9 0.9         Left   Supine    Snoring Statistics Snoring Level (dB) >40 >50 >60 >70 >80 >Threshold (45)  Sleep (min) 66.4 2.7 0.9 0.0 0.0 4.7  Sleep % 14.2 0.6 0.2 0.0 0.0 1.0     Mean: 40 dB Sleep Stages Chart                       Wake  Sleep      Wake  11.98  %    Sleep  88.02  %   Total:  100.00  %                                                          REM  Light  Deep      REM  18.82  %    Light  60.21  %    Deep  %  20.97   Total:  100.00  %                                 Sleep/Wake States  Sleep Stages  Sleep Latency (min):  REM Latency (min):  Number of Wakes:   19   42   9

## 2019-11-14 NOTE — Progress Notes (Signed)
Patient referred by Dr. Charlesetta Garibaldi, seen by me on 10/11/19, HST on 11/07/19.    Please call and notify the patient that the recent home sleep test showed obstructive sleep apnea. OSA is overall mild, but worth treating to see if she feels better after treatment, she may notice an improvement in her balance even. To that end, I recommend she start treatment for this in the form of autoPAP, which means, that we don't have to bring her in for a sleep study with CPAP, but will let her try an autoPAP machine at home, through a DME company (of her choice, or as per insurance requirement). The DME representative will educate her on how to use the machine, how to put the mask on, etc. I have placed an order in the chart. Please send referral, talk to patient, send report to referring MD. We will need a FU in sleep clinic for 10 weeks post-PAP set up, please arrange that with me or one of our NPs. Thanks,   Chelsea Age, MD, PhD [facility])

## 2019-11-14 NOTE — Telephone Encounter (Signed)
I called the pt and advised of sleep study results.  Pt declines pursuing cpap therapy at this time and will call back if she changes her mind. Pt had I discussed the benefits of cpap and the risks of osa being left un treated. We also discussed weight loss measures and oral appliance for osa treatment. Pt verbalized understanding/appreciation for the call and information but still declined to pursue autopap at this time.   Pt sts she will call back if she changes her mind.

## 2019-11-14 NOTE — Telephone Encounter (Signed)
-----   Message from Star Age, MD sent at 11/14/2019  2:31 PM EDT ----- Patient referred by Dr. Charlesetta Garibaldi, seen by me on 10/11/19, HST on 11/07/19.    Please call and notify the patient that the recent home sleep test showed obstructive sleep apnea. OSA is overall mild, but worth treating to see if she feels better after treatment, she may notice an improvement in her balance even. To that end, I recommend she start treatment for this in the form of autoPAP, which means, that we don't have to bring her in for a sleep study with CPAP, but will let her try an autoPAP machine at home, through a DME company (of her choice, or as per insurance requirement). The DME representative will educate her on how to use the machine, how to put the mask on, etc. I have placed an order in the chart. Please send referral, talk to patient, send report to referring MD. We will need a FU in sleep clinic for 10 weeks post-PAP set up, please arrange that with me or one of our NPs. Thanks,   Star Age, MD, PhD [facility])

## 2020-01-07 DIAGNOSIS — Z1159 Encounter for screening for other viral diseases: Secondary | ICD-10-CM | POA: Diagnosis not present

## 2020-01-10 DIAGNOSIS — Z86010 Personal history of colonic polyps: Secondary | ICD-10-CM | POA: Diagnosis not present

## 2020-01-10 DIAGNOSIS — K640 First degree hemorrhoids: Secondary | ICD-10-CM | POA: Diagnosis not present

## 2020-01-10 LAB — HM COLONOSCOPY

## 2020-02-13 DIAGNOSIS — I10 Essential (primary) hypertension: Secondary | ICD-10-CM | POA: Diagnosis not present

## 2020-02-13 DIAGNOSIS — K909 Intestinal malabsorption, unspecified: Secondary | ICD-10-CM | POA: Diagnosis not present

## 2020-02-13 DIAGNOSIS — Z23 Encounter for immunization: Secondary | ICD-10-CM | POA: Diagnosis not present

## 2020-02-13 DIAGNOSIS — E7800 Pure hypercholesterolemia, unspecified: Secondary | ICD-10-CM | POA: Diagnosis not present

## 2020-02-13 DIAGNOSIS — Z86010 Personal history of colonic polyps: Secondary | ICD-10-CM | POA: Diagnosis not present

## 2020-02-13 DIAGNOSIS — K760 Fatty (change of) liver, not elsewhere classified: Secondary | ICD-10-CM | POA: Diagnosis not present

## 2020-02-13 DIAGNOSIS — Z0000 Encounter for general adult medical examination without abnormal findings: Secondary | ICD-10-CM | POA: Diagnosis not present

## 2020-02-13 DIAGNOSIS — R7303 Prediabetes: Secondary | ICD-10-CM | POA: Diagnosis not present

## 2020-02-13 DIAGNOSIS — Z1159 Encounter for screening for other viral diseases: Secondary | ICD-10-CM | POA: Diagnosis not present

## 2020-02-14 ENCOUNTER — Other Ambulatory Visit: Payer: Self-pay | Admitting: Family Medicine

## 2020-02-14 DIAGNOSIS — E2839 Other primary ovarian failure: Secondary | ICD-10-CM

## 2020-02-14 DIAGNOSIS — Z13820 Encounter for screening for osteoporosis: Secondary | ICD-10-CM

## 2020-03-04 DIAGNOSIS — F4321 Adjustment disorder with depressed mood: Secondary | ICD-10-CM | POA: Diagnosis not present

## 2020-03-25 DIAGNOSIS — F4321 Adjustment disorder with depressed mood: Secondary | ICD-10-CM | POA: Diagnosis not present

## 2020-04-02 IMAGING — MR MR ABDOMEN WO/W CM
17 series · 48 of 48 positions shown · IV contrast (multihance)
Comparison: 06/16/2016

CLINICAL DATA: Followup left renal angiomyolipomas.

Creatinine was obtained on site at [HOSPITAL] at [HOSPITAL].
Results: Creatinine 0.8 mg/dL.
EXAM:
MRI ABDOMEN WITHOUT AND WITH CONTRAST
TECHNIQUE: Multiplanar multisequence MR imaging of the abdomen was performed
both before and after the administration of intravenous contrast.
CONTRAST:  15mL MULTIHANCE GADOBENATE DIMEGLUMINE 529 MG/ML IV SOLN

[Series 3: T2 · coronal · 5.0mm · 0.78mm/px · 1 of 16 slices shown (1 of 3)]
[im 1/16]
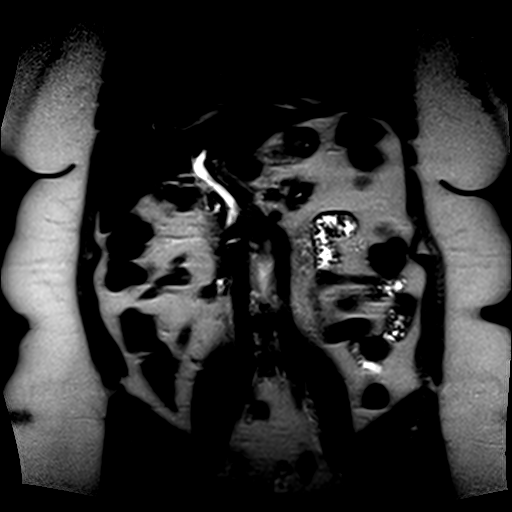

[Series 4: T2 · axial · 5.0mm · 0.70mm/px · 1 of 33 slices shown (2 of 3)]
[im 1/33]
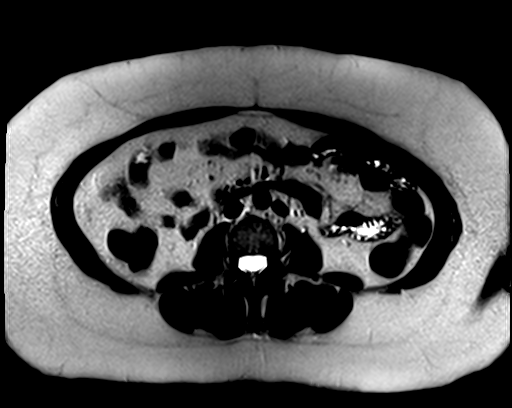

[Series 5: T1 · axial · 5.0mm · 0.70mm/px · 1 of 48 slices shown]
[im 1/48]
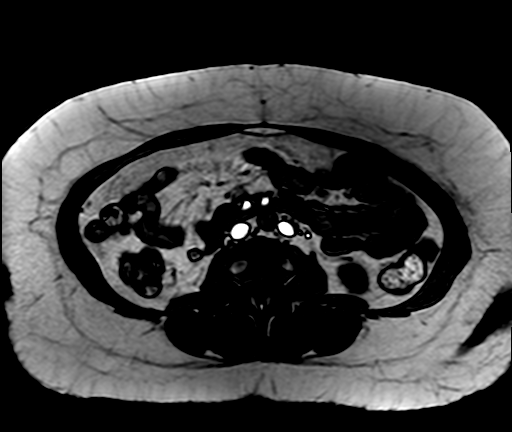

[Series 6: ep2d_[id number]_500_800_p2_trig · axial · 6.0mm · 2.08mm/px · z∈[-5,+190]mm · 3 of 78 slices shown]
[im 1/78]
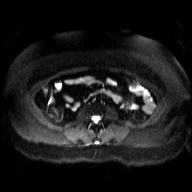
[im 39/78]
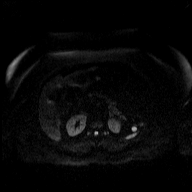
[im 78/78]
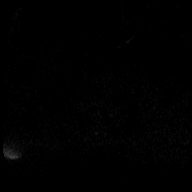

[Series 7: [id number]_800_p2_trig_adc · axial · 6.0mm · 2.08mm/px · 1 of 26 slices shown]
[im 1/26]
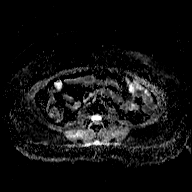

[Series 8: T2 · axial · 5.0mm · 1.41mm/px · 1 of 34 slices shown (3 of 3)]
[im 1/34]
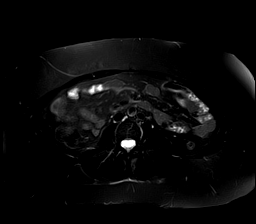

[Series 9: bSSFP · axial · 4.0mm · 0.74mm/px · 1 of 42 slices shown]
[im 1/42]
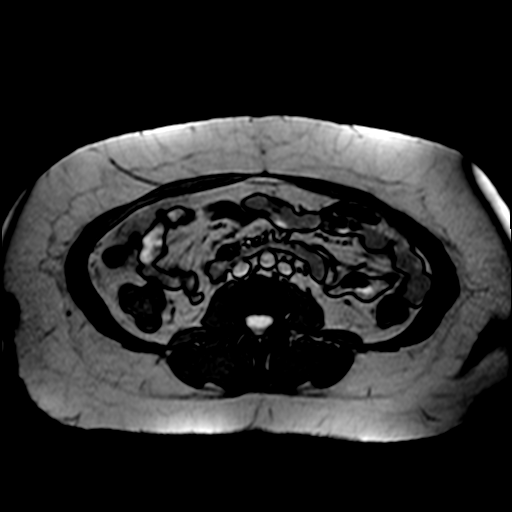

[Series 10: T1 dynamic · axial · non-contrast · 2.3mm · 1.48mm/px · z∈[-16,+147]mm · 4 of 72 slices shown]
[im 1/72]
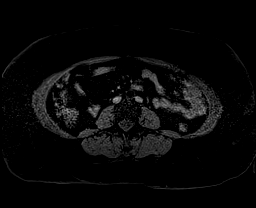
[im 24/72]
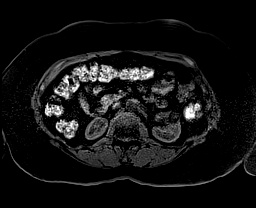
[im 48/72]
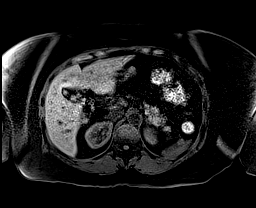
[im 72/72]
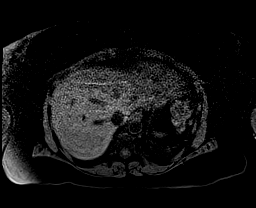

[Series 11: post 25 sec · axial · 2.3mm · 1.48mm/px · z∈[-16,+147]mm · 4 of 72 slices shown]
[im 1/72]
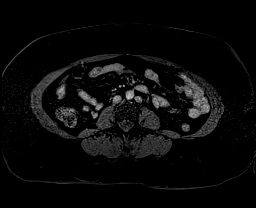
[im 24/72]
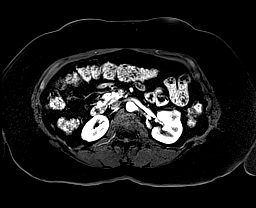
[im 48/72]
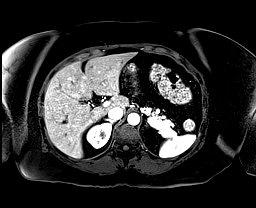
[im 72/72]
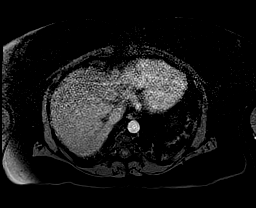

[Series 12: post 25 sec_sub · axial · 2.3mm · 1.48mm/px · z∈[-16,+147]mm · 4 of 72 slices shown]
[im 1/72]
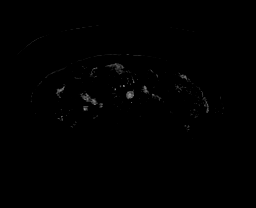
[im 24/72]
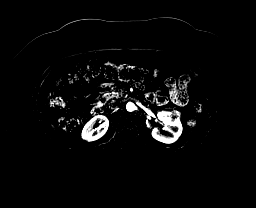
[im 48/72]
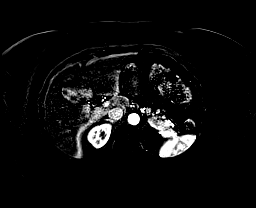
[im 72/72]
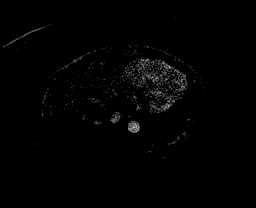

[Series 13: post 45 sec · axial · 2.3mm · 1.48mm/px · z∈[-16,+147]mm · 4 of 72 slices shown]
[im 1/72]
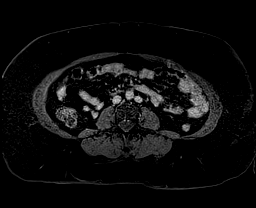
[im 24/72]
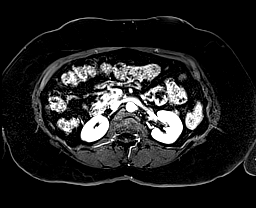
[im 48/72]
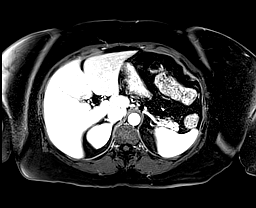
[im 72/72]
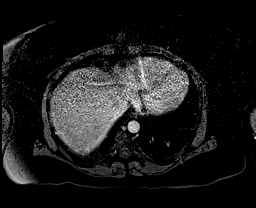

[Series 14: post 45 sec_sub · axial · 2.3mm · 1.48mm/px · z∈[-16,+147]mm · 4 of 72 slices shown]
[im 1/72]
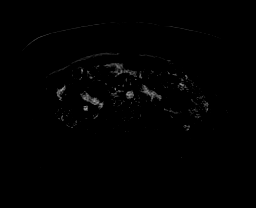
[im 24/72]
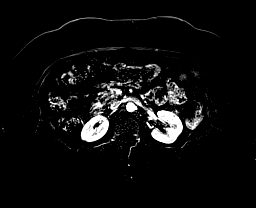
[im 48/72]
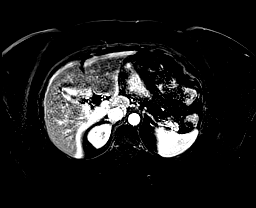
[im 72/72]
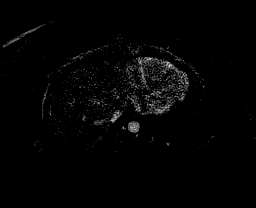

[Series 15: post 90 sec · axial · 2.3mm · 1.48mm/px · z∈[-16,+147]mm · 4 of 72 slices shown]
[im 1/72]
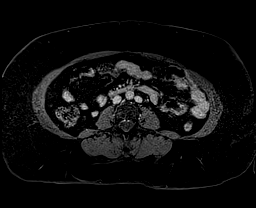
[im 24/72]
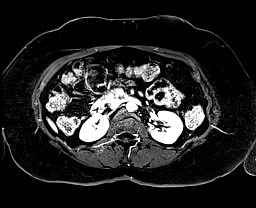
[im 48/72]
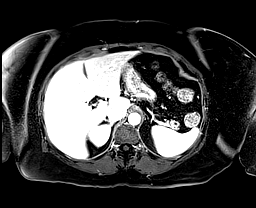
[im 72/72]
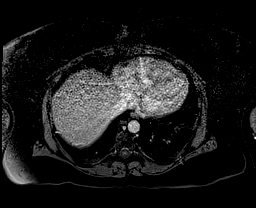

[Series 16: post 90 sec_sub · axial · 2.3mm · 1.48mm/px · z∈[-16,+147]mm · 4 of 72 slices shown]
[im 1/72]
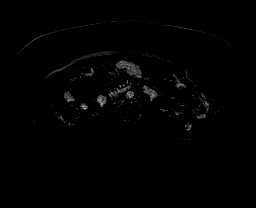
[im 24/72]
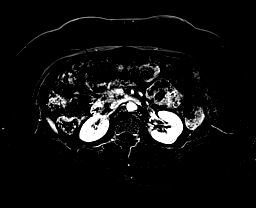
[im 48/72]
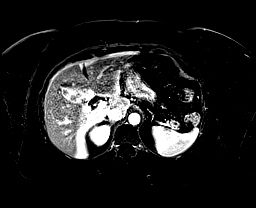
[im 72/72]
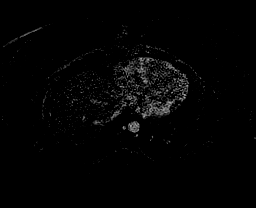

[Series 17: T1 dynamic post-contrast · coronal · 2.6mm · 0.78mm/px · 3 of 52 slices shown]
[im 1/52]
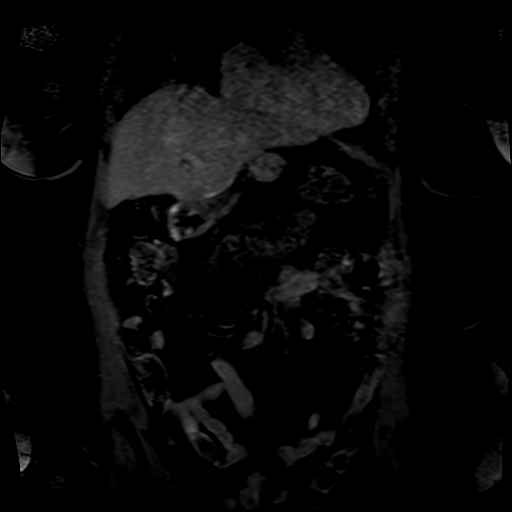
[im 26/52]
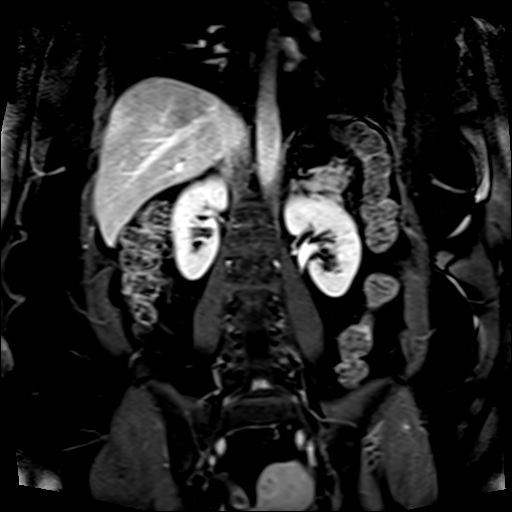
[im 52/52]
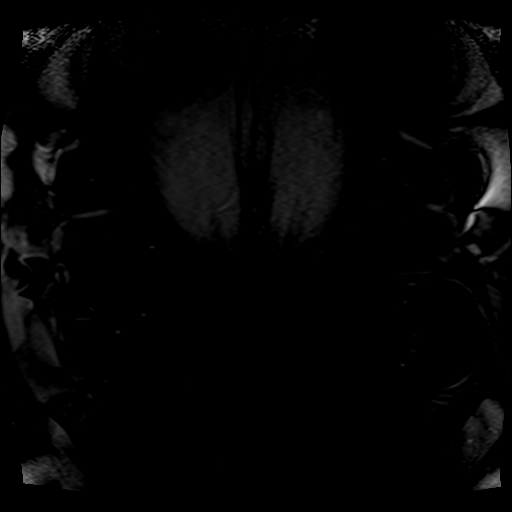

[Series 18: post axial 3+ · axial · 2.3mm · 1.48mm/px · z∈[-16,+147]mm · 4 of 72 slices shown (1 of 2)]
[im 1/72]
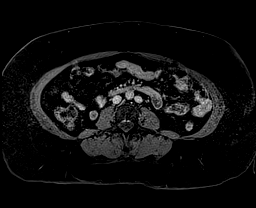
[im 24/72]
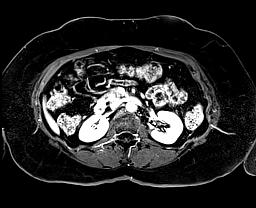
[im 48/72]
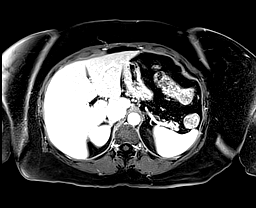
[im 72/72]
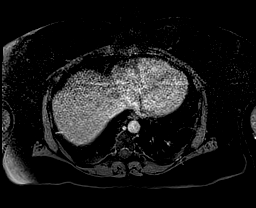

[Series 19: post axial 3+ · axial · 2.3mm · 1.48mm/px · z∈[-16,+147]mm · 4 of 72 slices shown (2 of 2)]
[im 1/72]
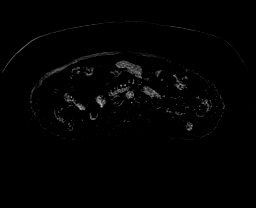
[im 24/72]
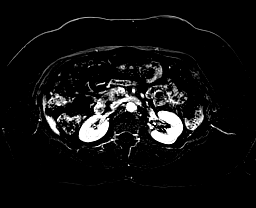
[im 48/72]
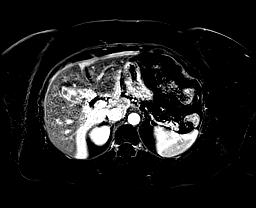
[im 72/72]
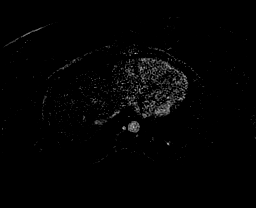

[48 of 48 positions shown; findings below may reference images not displayed]

FINDINGS: Lower chest: No acute findings.

Hepatobiliary: No hepatic masses identified. Prior cholecystectomy.
No evidence of biliary obstruction.

Pancreas:  No mass or inflammatory changes.

Spleen:  Within normal limits in size and appearance.

Adrenals/Urinary Tract: A 1.9 cm subcapsular mass in the anterior
interpolar region of the left kidney is stable and shows fat signal
intensity, consistent with a benign angiomyolipoma. A tiny less than
5 mm fat signal intensity lesion is seen in the posterior upper pole
of the left kidney, also stable and consistent with a tiny benign
angiomyolipoma. No new or enlarging liver masses are identified. No
evidence of hydronephrosis.

Stomach/Bowel: Visualized portion unremarkable.

Vascular/Lymphatic: No pathologically enlarged lymph nodes
identified. No abdominal aortic aneurysm. Incidental note is made of
congenital duplication of the IVC.

Other:  None.

Musculoskeletal:  No suspicious bone lesions identified.
IMPRESSION: No acute findings.

Two stable benign angiomyolipomas of the left kidney, largest
measuring 1.9 cm.

Congenital duplication of IVC.

## 2020-04-08 DIAGNOSIS — F4321 Adjustment disorder with depressed mood: Secondary | ICD-10-CM | POA: Diagnosis not present

## 2020-05-07 DIAGNOSIS — F4321 Adjustment disorder with depressed mood: Secondary | ICD-10-CM | POA: Diagnosis not present

## 2020-06-17 ENCOUNTER — Ambulatory Visit
Admission: RE | Admit: 2020-06-17 | Discharge: 2020-06-17 | Disposition: A | Discharge status: Home / Self Care | Payer: Medicare HMO | Source: Ambulatory Visit | Attending: Family Medicine | Admitting: Family Medicine

## 2020-06-17 ENCOUNTER — Other Ambulatory Visit: Payer: Self-pay

## 2020-06-17 DIAGNOSIS — E2839 Other primary ovarian failure: Secondary | ICD-10-CM

## 2020-06-17 DIAGNOSIS — Z13820 Encounter for screening for osteoporosis: Secondary | ICD-10-CM

## 2020-06-17 DIAGNOSIS — Z780 Asymptomatic menopausal state: Secondary | ICD-10-CM | POA: Diagnosis not present

## 2020-06-27 DIAGNOSIS — Z23 Encounter for immunization: Secondary | ICD-10-CM | POA: Diagnosis not present

## 2020-07-16 DIAGNOSIS — M25561 Pain in right knee: Secondary | ICD-10-CM | POA: Diagnosis not present

## 2020-07-16 DIAGNOSIS — M79644 Pain in right finger(s): Secondary | ICD-10-CM | POA: Diagnosis not present

## 2020-07-16 DIAGNOSIS — M1711 Unilateral primary osteoarthritis, right knee: Secondary | ICD-10-CM | POA: Diagnosis not present

## 2020-09-08 DIAGNOSIS — E7800 Pure hypercholesterolemia, unspecified: Secondary | ICD-10-CM | POA: Diagnosis not present

## 2020-09-08 DIAGNOSIS — R208 Other disturbances of skin sensation: Secondary | ICD-10-CM | POA: Diagnosis not present

## 2020-09-08 DIAGNOSIS — I10 Essential (primary) hypertension: Secondary | ICD-10-CM | POA: Diagnosis not present

## 2020-09-08 DIAGNOSIS — D1771 Benign lipomatous neoplasm of kidney: Secondary | ICD-10-CM | POA: Diagnosis not present

## 2020-09-08 DIAGNOSIS — E559 Vitamin D deficiency, unspecified: Secondary | ICD-10-CM | POA: Diagnosis not present

## 2020-09-08 DIAGNOSIS — M79674 Pain in right toe(s): Secondary | ICD-10-CM | POA: Diagnosis not present

## 2020-09-08 DIAGNOSIS — R7303 Prediabetes: Secondary | ICD-10-CM | POA: Diagnosis not present

## 2020-09-15 ENCOUNTER — Other Ambulatory Visit: Payer: Self-pay

## 2020-09-15 ENCOUNTER — Ambulatory Visit: Payer: Medicare HMO | Admitting: Podiatrist

## 2020-09-15 DIAGNOSIS — G5791 Unspecified mononeuropathy of right lower limb: Secondary | ICD-10-CM

## 2020-09-15 NOTE — Patient Instructions (Signed)
Morton Neuralgia  Morton neuralgia is foot pain that affects the ball of the foot and the area near the toes. Morton neuralgia occurs when part of a nerve in the foot (digital nerve) is under too much pressure (compressed). When this happens over a long period of time, the nerve can thicken (neuroma) and cause pain. Pain usually occurs between the third and fourth toes.  Morton neuralgia can come and go but may get worse over time. What are the causes? This condition is caused by doing the same things over and over with your foot, such as: Activities such as running or jumping. Wearing shoes that are too tight. What increases the risk? You may be at higher risk for Morton neuralgia if you: Are female. Wear high heels. Wear shoes that are narrow or tight. Do activities that repeatedly stretch your toes, such as: Running. Ballet. Long-distance walking. What are the signs or symptoms? The first symptom of Morton neuralgia is pain that spreads from the ball of the foot to the toes. It may feel like you are walking on a marble. Pain usually gets worse with walking and goes away at night. Other symptoms may include numbness and cramping of your toes. Both feet are equally affected, but rarelyat the same time. How is this diagnosed? This condition is diagnosed based on your symptoms, your medical history, and a physical exam. Your health care provider may: Squeeze your foot just behind your toe. Ask you to move your toes to check for pain. Ask about your physical activity level. You also may have imaging tests, such as an X-ray, ultrasound, or MRI. How is this treated? Treatment depends on how severe your condition is and what causes it. Treatment may involve: Wearing different shoes that are not too tight, are low-heeled, and provide good support. For some people, this is the only treatment needed. Wearing an over-the-counter or custom supportive pad (orthotic) under the front of your  foot. Getting injections of numbing medicine and anti-inflammatory medicine (steroid) in the nerve. Having surgery to remove part of the thickened nerve. Follow these instructions at home: Managing pain, stiffness, and swelling  Massage your foot as needed. Wear orthotics as told by your health care provider. If directed, put ice on your foot: Put ice in a plastic bag. Place a towel between your skin and the bag. Leave the ice on for 20 minutes, 2-3 times a day. Avoid activities that cause pain or make pain worse. If you play sports, ask your health care provider when it is safe for you to return to sports. Raise (elevate) your foot above the level of your heart while lying down and, when possible, while sitting.  General instructions Take over-the-counter and prescription medicines only as told by your health care provider. Do not drive or use heavy machinery while taking prescription pain medicine. Wear shoes that: Have soft soles. Have a wide toe area. Provide arch support. Do not pinch or squeeze your feet. Have room for your orthotics, if applicable. Keep all follow-up visits as told by your health care provider. This is important. Contact a health care provider if: Your symptoms get worse or do not get better with treatment and home care. Summary Morton neuralgia is foot pain that affects the ball of the foot and the area near the toes. Pain usually occurs between the third and fourth toes, gets worse with walking, and goes away at night. Morton neuralgia occurs when part of a nerve in the foot (digital nerve)  is under too much pressure. When this happens over a long period of time, the nerve can thicken (neuroma) and cause pain. This condition is caused by doing the same things over and over with your foot, such as running or jumping, wearing shoes that are too tight, or wearing high heels. Treatment may involve wearing low-heeled shoes that are not too tight, wearing a  supportive pad (orthotic) under the front of your foot, getting injections in the nerve, or having surgery to remove part of the thickened nerve. This information is not intended to replace advice given to you by your health care provider. Make sure you discuss any questions you have with your healthcare provider. Document Revised: 01/25/2017 Document Reviewed: 01/25/2017 Elsevier Patient Education  2022 [employer].

## 2020-09-16 ENCOUNTER — Encounter: Payer: Self-pay | Admitting: Podiatrist

## 2020-09-16 NOTE — Progress Notes (Signed)
  Chief Complaint  Patient presents with   Numbness    Right foot numbness in toes  PT stated that it is something that only happened a few times her primary care thinks its something with the nerves in her foot  She denies any pain at this time      HPI: Patient is 65 y.o. female who presents today for the concerns as listed above.  She relates she has had episodes of numbness in the toes of the right foot but it has only happened a few times.  She cannot replicate the feeling-  it is not painful.  She no longer wears steel toe shoes as she has retired.   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  12   metoprolol succinate (TOPROL-XL) 25 MG 24 hr tablet Take 1 tablet (25 mg total) by mouth at bedtime. 30 tablet 3   Multiple Vitamin (MULTIVITAMIN WITH MINERALS) TABS tablet Take 1 tablet by mouth daily.     VITAMIN D PO Take by mouth.     No current facility-administered medications on file prior to visit.    No Known Allergies  Review of Systems No fevers, chills, nausea, muscle aches, no difficulty breathing, no calf pain, no chest pain or shortness of breath.   Physical Exam  GENERAL APPEARANCE: Alert, conversant. Appropriately groomed. No acute distress.   VASCULAR: Pedal pulses palpable DP and PT bilateral.  Capillary refill time is immediate  to all digits,  Proximal to distal cooling it warm to warm.  Digital perfusion adequate.   NEUROLOGIC: sensation is intact to 5.07 monofilament at 5/5 sites bilateral.  Light touch is intact bilateral, vibratory sensation intact bilateral-  no numbness identified in the toes at todays visit.  No pain with strumming over the dorsal digital cutaneus nerve noted.  No replication of symptoms. Toes 2,3,4 are the toes she states become numb.   MUSCULOSKELETAL: acceptable muscle strength, tone and stability bilateral.  No gross boney pedal deformities noted.  No pain, crepitus or limitation noted with foot and ankle range of motion bilateral.   DERMATOLOGIC: skin is warm, supple, and dry.  No open lesions noted.  No rash, no pre ulcerative lesions. Digital nails are asymptomatic.      Assessment   1. Neuritis of right foot      Plan  Discussed exam findings with the patient.  Discussed that since the numbness is not reproducible, it is difficult to treat-  I did discuss she should wear comfortable supportive shoes that aren't too tight on her feet.  Discussed this is not presenting like typical peripheral neuropathy but also instructed her to try and pay attention to if the numbness is happening more frequently. She will moniter her symptoms and call if any worsening occurs.

## 2020-10-17 ENCOUNTER — Other Ambulatory Visit: Payer: Self-pay | Admitting: Obstetrics and Gynecology

## 2020-10-17 DIAGNOSIS — Z1231 Encounter for screening mammogram for malignant neoplasm of breast: Secondary | ICD-10-CM

## 2020-10-21 DIAGNOSIS — Z23 Encounter for immunization: Secondary | ICD-10-CM | POA: Diagnosis not present

## 2020-10-28 DIAGNOSIS — H524 Presbyopia: Secondary | ICD-10-CM | POA: Diagnosis not present

## 2020-10-28 DIAGNOSIS — H40013 Open angle with borderline findings, low risk, bilateral: Secondary | ICD-10-CM | POA: Diagnosis not present

## 2020-11-07 DIAGNOSIS — N2889 Other specified disorders of kidney and ureter: Secondary | ICD-10-CM | POA: Diagnosis not present

## 2020-11-07 DIAGNOSIS — Z9884 Bariatric surgery status: Secondary | ICD-10-CM | POA: Diagnosis not present

## 2020-11-19 ENCOUNTER — Ambulatory Visit
Admission: RE | Admit: 2020-11-19 | Discharge: 2020-11-19 | Disposition: A | Discharge status: Home / Self Care | Payer: Medicare HMO | Source: Ambulatory Visit | Attending: Obstetrics and Gynecology | Admitting: Obstetrics and Gynecology

## 2020-11-19 DIAGNOSIS — Z1231 Encounter for screening mammogram for malignant neoplasm of breast: Secondary | ICD-10-CM

## 2020-11-27 ENCOUNTER — Other Ambulatory Visit: Payer: Self-pay | Admitting: *Deleted

## 2020-11-27 DIAGNOSIS — N2889 Other specified disorders of kidney and ureter: Secondary | ICD-10-CM

## 2020-12-17 ENCOUNTER — Ambulatory Visit
Admission: RE | Admit: 2020-12-17 | Discharge: 2020-12-17 | Disposition: A | Discharge status: Home / Self Care | Payer: Medicare HMO | Source: Ambulatory Visit | Attending: Surgery | Admitting: Surgery

## 2020-12-17 ENCOUNTER — Other Ambulatory Visit: Payer: Self-pay

## 2020-12-17 DIAGNOSIS — Z9049 Acquired absence of other specified parts of digestive tract: Secondary | ICD-10-CM | POA: Diagnosis not present

## 2020-12-17 DIAGNOSIS — D1771 Benign lipomatous neoplasm of kidney: Secondary | ICD-10-CM | POA: Diagnosis not present

## 2020-12-17 DIAGNOSIS — N2889 Other specified disorders of kidney and ureter: Secondary | ICD-10-CM

## 2020-12-17 MED ORDER — GADOBENATE DIMEGLUMINE 529 MG/ML IV SOLN
15.0000 mL | Freq: Once | INTRAVENOUS | Status: AC | PRN
  Administered 2020-12-17: 15 mL via INTRAVENOUS

## 2021-01-14 DIAGNOSIS — R059 Cough, unspecified: Secondary | ICD-10-CM | POA: Diagnosis not present

## 2021-02-09 DIAGNOSIS — H40013 Open angle with borderline findings, low risk, bilateral: Secondary | ICD-10-CM | POA: Diagnosis not present

## 2021-02-19 DIAGNOSIS — Z6834 Body mass index (BMI) 34.0-34.9, adult: Secondary | ICD-10-CM | POA: Diagnosis not present

## 2021-02-19 DIAGNOSIS — Z131 Encounter for screening for diabetes mellitus: Secondary | ICD-10-CM | POA: Diagnosis not present

## 2021-02-19 DIAGNOSIS — I10 Essential (primary) hypertension: Secondary | ICD-10-CM | POA: Diagnosis not present

## 2021-02-19 DIAGNOSIS — Z903 Acquired absence of stomach [part of]: Secondary | ICD-10-CM | POA: Diagnosis not present

## 2021-02-19 DIAGNOSIS — E669 Obesity, unspecified: Secondary | ICD-10-CM | POA: Diagnosis not present

## 2021-02-26 DIAGNOSIS — F4321 Adjustment disorder with depressed mood: Secondary | ICD-10-CM | POA: Diagnosis not present

## 2021-03-18 DIAGNOSIS — E119 Type 2 diabetes mellitus without complications: Secondary | ICD-10-CM | POA: Diagnosis not present

## 2021-04-02 DIAGNOSIS — F4321 Adjustment disorder with depressed mood: Secondary | ICD-10-CM | POA: Diagnosis not present

## 2021-04-29 DIAGNOSIS — F4321 Adjustment disorder with depressed mood: Secondary | ICD-10-CM | POA: Diagnosis not present

## 2021-06-17 DIAGNOSIS — F4321 Adjustment disorder with depressed mood: Secondary | ICD-10-CM | POA: Diagnosis not present

## 2021-06-25 DIAGNOSIS — I10 Essential (primary) hypertension: Secondary | ICD-10-CM | POA: Diagnosis not present

## 2021-06-25 DIAGNOSIS — Z0000 Encounter for general adult medical examination without abnormal findings: Secondary | ICD-10-CM | POA: Diagnosis not present

## 2021-06-25 DIAGNOSIS — E119 Type 2 diabetes mellitus without complications: Secondary | ICD-10-CM | POA: Diagnosis not present

## 2021-06-25 DIAGNOSIS — Z23 Encounter for immunization: Secondary | ICD-10-CM | POA: Diagnosis not present

## 2021-07-30 DIAGNOSIS — F4321 Adjustment disorder with depressed mood: Secondary | ICD-10-CM | POA: Diagnosis not present

## 2021-08-24 DIAGNOSIS — H40011 Open angle with borderline findings, low risk, right eye: Secondary | ICD-10-CM | POA: Diagnosis not present

## 2021-09-22 DIAGNOSIS — Z01419 Encounter for gynecological examination (general) (routine) without abnormal findings: Secondary | ICD-10-CM | POA: Diagnosis not present

## 2021-09-22 DIAGNOSIS — Z780 Asymptomatic menopausal state: Secondary | ICD-10-CM | POA: Diagnosis not present

## 2021-09-22 DIAGNOSIS — Z6830 Body mass index (BMI) 30.0-30.9, adult: Secondary | ICD-10-CM | POA: Diagnosis not present

## 2021-09-22 DIAGNOSIS — Z124 Encounter for screening for malignant neoplasm of cervix: Secondary | ICD-10-CM | POA: Diagnosis not present

## 2021-10-01 DIAGNOSIS — Z23 Encounter for immunization: Secondary | ICD-10-CM | POA: Diagnosis not present

## 2021-10-06 ENCOUNTER — Other Ambulatory Visit: Payer: Self-pay | Admitting: Obstetrics and Gynecology

## 2021-10-06 DIAGNOSIS — Z1231 Encounter for screening mammogram for malignant neoplasm of breast: Secondary | ICD-10-CM

## 2021-10-30 DIAGNOSIS — H40013 Open angle with borderline findings, low risk, bilateral: Secondary | ICD-10-CM | POA: Diagnosis not present

## 2021-10-30 DIAGNOSIS — H524 Presbyopia: Secondary | ICD-10-CM | POA: Diagnosis not present

## 2021-11-05 DIAGNOSIS — E119 Type 2 diabetes mellitus without complications: Secondary | ICD-10-CM | POA: Diagnosis not present

## 2021-11-05 DIAGNOSIS — Z7689 Persons encountering health services in other specified circumstances: Secondary | ICD-10-CM | POA: Diagnosis not present

## 2021-11-05 DIAGNOSIS — I10 Essential (primary) hypertension: Secondary | ICD-10-CM | POA: Diagnosis not present

## 2021-11-19 DIAGNOSIS — F4321 Adjustment disorder with depressed mood: Secondary | ICD-10-CM | POA: Diagnosis not present

## 2021-11-26 ENCOUNTER — Ambulatory Visit: Payer: Medicare HMO

## 2021-12-03 ENCOUNTER — Ambulatory Visit
Admission: RE | Admit: 2021-12-03 | Discharge: 2021-12-03 | Disposition: A | Discharge status: Home / Self Care | Payer: Medicare HMO | Source: Ambulatory Visit | Attending: Obstetrics and Gynecology | Admitting: Obstetrics and Gynecology

## 2021-12-03 DIAGNOSIS — Z1231 Encounter for screening mammogram for malignant neoplasm of breast: Secondary | ICD-10-CM

## 2021-12-07 ENCOUNTER — Other Ambulatory Visit: Payer: Self-pay | Admitting: Obstetrics and Gynecology

## 2021-12-07 DIAGNOSIS — N644 Mastodynia: Secondary | ICD-10-CM

## 2021-12-07 DIAGNOSIS — N6310 Unspecified lump in the right breast, unspecified quadrant: Secondary | ICD-10-CM

## 2021-12-09 DIAGNOSIS — F4321 Adjustment disorder with depressed mood: Secondary | ICD-10-CM | POA: Diagnosis not present

## 2021-12-23 ENCOUNTER — Ambulatory Visit
Admission: RE | Admit: 2021-12-23 | Discharge: 2021-12-23 | Disposition: A | Discharge status: Home / Self Care | Payer: Medicare HMO | Source: Ambulatory Visit | Attending: Obstetrics and Gynecology | Admitting: Obstetrics and Gynecology

## 2021-12-23 DIAGNOSIS — N6310 Unspecified lump in the right breast, unspecified quadrant: Secondary | ICD-10-CM

## 2021-12-23 DIAGNOSIS — N644 Mastodynia: Secondary | ICD-10-CM

## 2021-12-23 DIAGNOSIS — H524 Presbyopia: Secondary | ICD-10-CM | POA: Diagnosis not present

## 2021-12-23 DIAGNOSIS — N6489 Other specified disorders of breast: Secondary | ICD-10-CM | POA: Diagnosis not present

## 2021-12-23 DIAGNOSIS — H52223 Regular astigmatism, bilateral: Secondary | ICD-10-CM | POA: Diagnosis not present

## 2022-01-05 ENCOUNTER — Other Ambulatory Visit: Payer: Self-pay | Admitting: Obstetrics and Gynecology

## 2022-01-05 DIAGNOSIS — Z1231 Encounter for screening mammogram for malignant neoplasm of breast: Secondary | ICD-10-CM

## 2022-01-11 ENCOUNTER — Other Ambulatory Visit: Payer: Self-pay | Admitting: Obstetrics and Gynecology

## 2022-01-11 DIAGNOSIS — N6310 Unspecified lump in the right breast, unspecified quadrant: Secondary | ICD-10-CM

## 2022-01-11 DIAGNOSIS — Z1231 Encounter for screening mammogram for malignant neoplasm of breast: Secondary | ICD-10-CM

## 2022-01-28 ENCOUNTER — Ambulatory Visit
Admission: RE | Admit: 2022-01-28 | Discharge: 2022-01-28 | Disposition: A | Discharge status: Home / Self Care | Payer: Medicare HMO | Source: Ambulatory Visit | Attending: Obstetrics and Gynecology | Admitting: Obstetrics and Gynecology

## 2022-01-28 DIAGNOSIS — N6489 Other specified disorders of breast: Secondary | ICD-10-CM | POA: Diagnosis not present

## 2022-01-28 DIAGNOSIS — N6310 Unspecified lump in the right breast, unspecified quadrant: Secondary | ICD-10-CM

## 2022-01-28 DIAGNOSIS — Z1231 Encounter for screening mammogram for malignant neoplasm of breast: Secondary | ICD-10-CM

## 2022-01-28 MED ORDER — GADOPICLENOL 0.5 MMOL/ML IV SOLN
7.0000 mL | Freq: Once | INTRAVENOUS | Status: AC | PRN
  Administered 2022-01-28: 7 mL via INTRAVENOUS

## 2022-01-29 ENCOUNTER — Other Ambulatory Visit: Payer: Self-pay | Admitting: Obstetrics and Gynecology

## 2022-01-29 DIAGNOSIS — R928 Other abnormal and inconclusive findings on diagnostic imaging of breast: Secondary | ICD-10-CM

## 2022-02-05 ENCOUNTER — Ambulatory Visit
Admission: RE | Admit: 2022-02-05 | Discharge: 2022-02-05 | Disposition: A | Discharge status: Home / Self Care | Payer: Medicare HMO | Source: Ambulatory Visit | Attending: Obstetrics and Gynecology | Admitting: Obstetrics and Gynecology

## 2022-02-05 ENCOUNTER — Other Ambulatory Visit: Payer: Self-pay | Admitting: Obstetrics and Gynecology

## 2022-02-05 DIAGNOSIS — R928 Other abnormal and inconclusive findings on diagnostic imaging of breast: Secondary | ICD-10-CM

## 2022-03-17 DIAGNOSIS — H524 Presbyopia: Secondary | ICD-10-CM | POA: Diagnosis not present

## 2022-03-17 DIAGNOSIS — H52223 Regular astigmatism, bilateral: Secondary | ICD-10-CM | POA: Diagnosis not present

## 2022-03-23 LAB — AMB RESULTS CONSOLE CBG: Glucose: 133

## 2022-04-20 LAB — AMB RESULTS CONSOLE CBG: Glucose: 133

## 2022-04-20 NOTE — Progress Notes (Signed)
There were no SDOH needs at this time.

## 2022-04-23 ENCOUNTER — Encounter: Payer: Self-pay | Admitting: *Deleted

## 2022-04-23 NOTE — Progress Notes (Signed)
Pt seen at 04/20/22 screening event where screening results were wnl. Pt denies any SDOH insecurities, at the event or at time of f/u call. Pt further explained she has been seeing PCP over the past year but is changing to Dr. Glendale Chard for her PCP, with whom she has a future appt on 05/18/22. Pt states she has no healthcare needs with which she needs support  in this interim period between PCP. She also expressed appreciation for the follow up offer of support to ensure she and her husband are connected to the healthcare they need. No additional health equity team support indicated at this time.

## 2022-05-07 ENCOUNTER — Other Ambulatory Visit: Payer: Self-pay | Admitting: Obstetrics and Gynecology

## 2022-05-07 ENCOUNTER — Ambulatory Visit
Admission: RE | Admit: 2022-05-07 | Discharge: 2022-05-07 | Disposition: A | Discharge status: Home / Self Care | Payer: Medicare HMO | Source: Ambulatory Visit | Attending: Obstetrics and Gynecology | Admitting: Obstetrics and Gynecology

## 2022-05-07 ENCOUNTER — Other Ambulatory Visit: Payer: Self-pay | Admitting: Internal Medicine

## 2022-05-07 ENCOUNTER — Encounter: Payer: Self-pay | Admitting: Obstetrics and Gynecology

## 2022-05-07 DIAGNOSIS — R928 Other abnormal and inconclusive findings on diagnostic imaging of breast: Secondary | ICD-10-CM

## 2022-05-07 DIAGNOSIS — N6310 Unspecified lump in the right breast, unspecified quadrant: Secondary | ICD-10-CM

## 2022-05-13 ENCOUNTER — Ambulatory Visit
Admission: RE | Admit: 2022-05-13 | Discharge: 2022-05-13 | Disposition: A | Discharge status: Home / Self Care | Payer: Medicare HMO | Source: Ambulatory Visit | Attending: Internal Medicine | Admitting: Internal Medicine

## 2022-05-13 DIAGNOSIS — N6310 Unspecified lump in the right breast, unspecified quadrant: Secondary | ICD-10-CM

## 2022-05-13 DIAGNOSIS — R928 Other abnormal and inconclusive findings on diagnostic imaging of breast: Secondary | ICD-10-CM

## 2022-05-13 HISTORY — PX: BREAST BIOPSY: SHX20

## 2022-05-18 ENCOUNTER — Ambulatory Visit (INDEPENDENT_AMBULATORY_CARE_PROVIDER_SITE_OTHER): Payer: Medicare HMO | Admitting: Internal Medicine

## 2022-05-18 ENCOUNTER — Encounter: Payer: Self-pay | Admitting: Internal Medicine

## 2022-05-18 VITALS — BP 118/82 | HR 57 | Temp 98.4°F | Ht 62.0 in | Wt 161.4 lb

## 2022-05-18 DIAGNOSIS — Z809 Family history of malignant neoplasm, unspecified: Secondary | ICD-10-CM | POA: Diagnosis not present

## 2022-05-18 DIAGNOSIS — I10 Essential (primary) hypertension: Secondary | ICD-10-CM

## 2022-05-18 DIAGNOSIS — E278 Other specified disorders of adrenal gland: Secondary | ICD-10-CM | POA: Insufficient documentation

## 2022-05-18 DIAGNOSIS — E1165 Type 2 diabetes mellitus with hyperglycemia: Secondary | ICD-10-CM

## 2022-05-18 DIAGNOSIS — D1771 Benign lipomatous neoplasm of kidney: Secondary | ICD-10-CM

## 2022-05-18 DIAGNOSIS — Z0000 Encounter for general adult medical examination without abnormal findings: Secondary | ICD-10-CM | POA: Diagnosis not present

## 2022-05-18 DIAGNOSIS — Z7689 Persons encountering health services in other specified circumstances: Secondary | ICD-10-CM

## 2022-05-18 LAB — POCT URINALYSIS DIPSTICK
Bilirubin, UA: NEGATIVE
Blood, UA: NEGATIVE
Glucose, UA: NEGATIVE
Ketones, UA: NEGATIVE
Leukocytes, UA: NEGATIVE
Nitrite, UA: NEGATIVE
Protein, UA: NEGATIVE
Spec Grav, UA: >=1.03 — AB (ref 1.010–1.025)
Urobilinogen, UA: 0.2 E.U./dL
pH, UA: 6 (ref 5.0–8.0)

## 2022-05-18 NOTE — Patient Instructions (Signed)

## 2022-05-18 NOTE — Progress Notes (Signed)
I,Victoria T Hamilton,acting as a scribe for [name], MD.,have documented all relevant documentation on the behalf of [name], MD,as directed by  [name], MD while in the presence of [name], MD.   Subjective:     Patient ID: Chelsea Landry , female    DOB: 04-08-1955 , 67 y.o.   MRN: [id number]   Chief Complaint  Patient presents with   Annual Exam   Hypertension    HPI  Patient presents today to establish care. Previous PCP: Primus Bravo.  She was referred by a friend. She reports h/o HTN and prediabetes. She states she was initially prescribed Ozempic; however, she has not taken since January. She reports completing breast biopsy last week, she would like to go over results.   She also reports weight loss surgery in 2010. During procedure they found fatty tumors on her left kidney. She has had specialty f/u for this w/ Dr Luretha Murphy. She states she exercises 4x/week.  She retired from [employer], served 34 years at company.   She is to visit GYN: Dr. Cherly Hensen in August.  Hypertension This is a chronic problem. The current episode started more than 1 year ago. The problem has been gradually improving since onset. The problem is controlled. Past treatments include angiotensin blockers and beta blockers.     Past Medical History:  Diagnosis Date   Fibroids    Frequency of urination    Hypertension    Rash    RT LEG   SVD (spontaneous vaginal delivery)    x 2   Vaginal lesion      Family History  Problem Relation Age of Onset   Leukemia Mother    Acute lymphoblastic leukemia Mother    Aneurysm Father    Anuerysm Father    Lung cancer Sister    Breast cancer Sister    Cancer - Lung Sister    Brain cancer Brother    Breast cancer Paternal Aunt        unknown age   Hypertension Other    Cancer Maternal Aunt      Current Outpatient Medications:    losartan (COZAAR) 50 MG tablet, Take 50 mg by mouth daily., Disp: , Rfl:  12   metoprolol succinate (TOPROL-XL) 25 MG 24 hr tablet, Take 1 tablet (25 mg total) by mouth at bedtime., Disp: 30 tablet, Rfl: 3   Multiple Vitamin (MULTIVITAMIN WITH MINERALS) TABS tablet, Take 1 tablet by mouth daily., Disp: , Rfl:    VITAMIN D PO, Take by mouth., Disp: , Rfl:    OZEMPIC, 0.25 OR 0.5 MG/DOSE, 2 MG/3ML SOPN, INJECT 0.50 MG INTO THE SKIN WEEKLY (Patient not taking: Reported on 05/18/2022), Disp: , Rfl:    No Known Allergies    The patient states she uses post menopausal status for birth control. Last LMP was No LMP recorded. Patient is postmenopausal.. Negative for Dysmenorrhea. Negative for: breast discharge, breast lump(s), breast pain and breast self exam. Associated symptoms include abnormal vaginal bleeding. Pertinent negatives include abnormal bleeding (hematology), anxiety, decreased libido, depression, difficulty falling sleep, dyspareunia, history of infertility, nocturia, sexual dysfunction, sleep disturbances, urinary incontinence, urinary urgency, vaginal discharge and vaginal itching. Diet regular.The patient states her exercise level is    . The patient's tobacco use is:  Social History   Tobacco Use  Smoking Status Never  Smokeless Tobacco Never  . She has been exposed to passive smoke. The patient's alcohol use is:  Social History   Substance and Sexual Activity  Alcohol Use No   Review of Systems  Constitutional: Negative.   HENT: Negative.    Eyes: Negative.   Respiratory: Negative.    Cardiovascular: Negative.   Gastrointestinal: Negative.   Endocrine: Negative.   Genitourinary: Negative.   Musculoskeletal: Negative.   Skin: Negative.   Allergic/Immunologic: Negative.   Neurological: Negative.   Hematological: Negative.   Psychiatric/Behavioral: Negative.       Today's Vitals   05/18/22 1427  BP: 118/82  Pulse: (!) 57  Temp: 98.4 F (36.9 C)  Weight: 161 lb 6.4 oz (73.2 kg)  Height: 5\' 2"  (1.575 m)   Body mass index is 29.52 kg/m.   Wt Readings from Last 3 Encounters:  05/18/22 161 lb 6.4 oz (73.2 kg)  04/20/22 156 lb (70.8 kg)  10/11/19 177 lb 5 oz (80.4 kg)    Objective:  Physical Exam Vitals and nursing note reviewed.  Constitutional:      Appearance: Normal appearance.  HENT:     Head: Normocephalic and atraumatic.     Right Ear: Tympanic membrane, ear canal and external ear normal. There is no impacted cerumen.     Left Ear: Tympanic membrane, ear canal and external ear normal. There is no impacted cerumen.     Mouth/Throat:     Mouth: Mucous membranes are dry.     Pharynx: Oropharynx is clear.  Eyes:     Extraocular Movements: Extraocular movements intact.  Cardiovascular:     Rate and Rhythm: Normal rate and regular rhythm.     Pulses:          Dorsalis pedis pulses are 2+ on the right side and 2+ on the left side.     Heart sounds: Normal heart sounds.  Pulmonary:     Effort: Pulmonary effort is normal.     Breath sounds: Normal breath sounds.  Chest:  Breasts:    Tanner Score is 5.     Right: Normal.     Left: Normal.  Abdominal:     General: Bowel sounds are normal.     Palpations: Abdomen is soft.  Genitourinary:    Comments: Deferred  Musculoskeletal:        General: Normal range of motion.     Cervical back: Normal range of motion.  Feet:     Right foot:     Protective Sensation: 5 sites tested.  5 sites sensed.     Skin integrity: Skin integrity normal.     Toenail Condition: Right toenails are normal.     Left foot:     Protective Sensation: 5 sites tested.  5 sites sensed.     Skin integrity: Skin integrity normal.     Toenail Condition: Left toenails are normal.  Skin:    General: Skin is warm.  Neurological:     General: No focal deficit present.     Mental Status: She is alert.  Psychiatric:        Mood and Affect: Mood normal.        Behavior: Behavior normal.         Assessment And Plan:     1. Encounter for annual physical exam Comments: A full exam was  performed. Importance of monthly self breast exams was discussed with the patient.  She will rto in 2 months for AWV w/ [facility] Advisor. PATIENT IS ADVISED TO GET 30-45 MINUTES REGULAR EXERCISE NO LESS THAN FOUR TO FIVE DAYS PER WEEK -  BOTH WEIGHTBEARING EXERCISES AND AEROBIC ARE RECOMMENDED.  PATIENT IS ADVISED TO FOLLOW A HEALTHY DIET WITH AT LEAST SIX FRUITS/VEGGIES PER DAY, DECREASE INTAKE OF RED MEAT, AND TO INCREASE FISH INTAKE TO TWO DAYS PER WEEK.  MEATS/FISH SHOULD NOT BE FRIED, BAKED OR BROILED IS PREFERABLE.  IT IS ALSO IMPORTANT TO CUT BACK ON YOUR SUGAR INTAKE. PLEASE AVOID ANYTHING WITH ADDED SUGAR, CORN SYRUP OR OTHER SWEETENERS. IF YOU MUST USE A SWEETENER, YOU CAN TRY STEVIA. IT IS ALSO IMPORTANT TO AVOID ARTIFICIALLY SWEETENERS AND DIET BEVERAGES. LASTLY, I SUGGEST WEARING SPF 50 SUNSCREEN ON EXPOSED PARTS AND ESPECIALLY WHEN IN THE DIRECT SUNLIGHT FOR AN EXTENDED PERIOD OF TIME.  PLEASE AVOID FAST FOOD RESTAURANTS AND INCREASE YOUR WATER INTAKE.  2. Essential hypertension, benign Comments: Chronic, controlled.  EKG performed, NSR w/o acute cahnges. She will c/w losartan and metoprolol. Encouraged to follow low sodium diet, f/u 4-6 months. - POCT Urinalysis Dipstick (81002) - Microalbumin / creatinine urine ratio - EKG 12-Lead - CMP14+EGFR - CBC - Hemoglobin A1c - Lipid panel  3. Type 2 diabetes mellitus with hyperglycemia, without long-term current use of insulin (HCC) Comments: Diabetic foot exam was performed. Pt states she was not aware she had DM. Her a1c Jan 2023 was 6.5. [facility] records reviewed in Care Everywhere during her visit.  I will resume Ozempic, 0.25mg  weekly x 4, I do plan to titrate upward as tolerated. I DISCUSSED WITH THE PATIENT AT LENGTH REGARDING THE GOALS OF GLYCEMIC CONTROL AND POSSIBLE LONG-TERM COMPLICATIONS.  I  ALSO STRESSED THE IMPORTANCE OF COMPLIANCE WITH HOME GLUCOSE MONITORING, DIETARY RESTRICTIONS INCLUDING AVOIDANCE OF SUGARY DRINKS/PROCESSED FOODS,   ALONG WITH REGULAR EXERCISE.  I  ALSO STRESSED THE IMPORTANCE OF ANNUAL EYE EXAMS, SELF FOOT CARE AND COMPLIANCE WITH OFFICE VISITS.  - OZEMPIC, 0.25 OR 0.5 MG/DOSE, 2 MG/3ML SOPN; INJECT 0.50 MG INTO THE SKIN WEEKLY (Patient not taking: Reported on 05/18/2022)  4. Angiomyolipoma of kidney Comments: PT is currently followed by General Surgery. She may benefit from Urology evaluation in the future. Most recent CT results reviewed in full detail.  5. Family history of cancer Comments: She agrees to [employer] evaluation. - Ambulatory referral to Genetics  6. Encounter to establish care  Patient was given opportunity to ask questions. Patient verbalized understanding of the plan and was able to repeat key elements of the plan. All questions were answered to their satisfaction.   I, [name], MD, have reviewed all documentation for this visit. The documentation on 05/18/22 for the exam, diagnosis, procedures, and orders are all accurate and complete.  THE PATIENT IS ENCOURAGED TO PRACTICE SOCIAL DISTANCING DUE TO THE COVID-19 PANDEMIC.

## 2022-05-19 LAB — CMP14+EGFR
ALT: 44 IU/L — ABNORMAL HIGH (ref 0–32)
AST: 39 IU/L (ref 0–40)
Albumin/Globulin Ratio: 1.8 (ref 1.2–2.2)
Albumin: 4.2 g/dL (ref 3.9–4.9)
Alkaline Phosphatase: 70 IU/L (ref 44–121)
BUN/Creatinine Ratio: 16 (ref 12–28)
BUN: 14 mg/dL (ref 8–27)
Bilirubin Total: 0.3 mg/dL (ref 0.0–1.2)
CO2: 24 mmol/L (ref 20–29)
Calcium: 9.6 mg/dL (ref 8.7–10.3)
Chloride: 104 mmol/L (ref 96–106)
Creatinine, Ser: 0.87 mg/dL (ref 0.57–1.00)
Globulin, Total: 2.3 g/dL (ref 1.5–4.5)
Glucose: 83 mg/dL (ref 70–99)
Potassium: 4.3 mmol/L (ref 3.5–5.2)
Sodium: 142 mmol/L (ref 134–144)
Total Protein: 6.5 g/dL (ref 6.0–8.5)
eGFR: 73 mL/min/{1.73_m2} (ref >59)

## 2022-05-19 LAB — LIPID PANEL
Chol/HDL Ratio: 2.9 ratio (ref 0.0–4.4)
Cholesterol, Total: 197 mg/dL (ref 100–199)
HDL: 68 mg/dL
LDL Chol Calc (NIH): 114 mg/dL — ABNORMAL HIGH (ref 0–99)
Triglycerides: 84 mg/dL (ref 0–149)
VLDL Cholesterol Cal: 15 mg/dL (ref 5–40)

## 2022-05-19 LAB — CBC
Hematocrit: 42 % (ref 34.0–46.6)
Hemoglobin: 14.2 g/dL (ref 11.1–15.9)
MCH: 30.5 pg (ref 26.6–33.0)
MCHC: 33.8 g/dL (ref 31.5–35.7)
MCV: 90 fL (ref 79–97)
Platelets: 273 x10E3/uL (ref 150–450)
RBC: 4.66 x10E6/uL (ref 3.77–5.28)
RDW: 12.8 % (ref 11.7–15.4)
WBC: 6.8 x10E3/uL (ref 3.4–10.8)

## 2022-05-19 LAB — MICROALBUMIN / CREATININE URINE RATIO
Creatinine, Urine: 120.7 mg/dL
Microalb/Creat Ratio: <2 mg/g creat (ref 0–29)
Microalbumin, Urine: <3 ug/mL

## 2022-05-19 LAB — HEMOGLOBIN A1C
Est. average glucose Bld gHb Est-mCnc: 131 mg/dL
Hgb A1c MFr Bld: 6.2 % — ABNORMAL HIGH (ref 4.8–5.6)

## 2022-05-23 DIAGNOSIS — D1771 Benign lipomatous neoplasm of kidney: Secondary | ICD-10-CM | POA: Insufficient documentation

## 2022-05-23 DIAGNOSIS — I10 Essential (primary) hypertension: Secondary | ICD-10-CM | POA: Insufficient documentation

## 2022-05-23 DIAGNOSIS — E1165 Type 2 diabetes mellitus with hyperglycemia: Secondary | ICD-10-CM | POA: Insufficient documentation

## 2022-05-23 DIAGNOSIS — Z809 Family history of malignant neoplasm, unspecified: Secondary | ICD-10-CM | POA: Insufficient documentation

## 2022-06-22 ENCOUNTER — Other Ambulatory Visit: Payer: Self-pay | Admitting: Internal Medicine

## 2022-06-22 DIAGNOSIS — E1165 Type 2 diabetes mellitus with hyperglycemia: Secondary | ICD-10-CM

## 2022-06-24 LAB — HM DIABETES EYE EXAM

## 2022-06-25 ENCOUNTER — Other Ambulatory Visit: Payer: Self-pay | Admitting: Internal Medicine

## 2022-06-25 DIAGNOSIS — E1165 Type 2 diabetes mellitus with hyperglycemia: Secondary | ICD-10-CM

## 2022-06-30 ENCOUNTER — Ambulatory Visit (INDEPENDENT_AMBULATORY_CARE_PROVIDER_SITE_OTHER): Payer: Medicare HMO

## 2022-06-30 VITALS — BP 112/64 | HR 76 | Temp 97.6°F | Ht 61.8 in | Wt 158.6 lb

## 2022-06-30 DIAGNOSIS — Z0000 Encounter for general adult medical examination without abnormal findings: Secondary | ICD-10-CM | POA: Diagnosis not present

## 2022-06-30 NOTE — Patient Instructions (Signed)
Chelsea Landry , Thank you for taking time to come for your Medicare Wellness Visit. I appreciate your ongoing commitment to your health goals. Please review the following plan we discussed and let me know if I can assist you in the future.   These are the goals we discussed:  Goals      Patient Stated     06/30/2022, make sure arteries do not get clogged, wants to drink more water, make sure kidneys are functioning and tumors are not growing        This is a list of the screening recommended for you and due dates:  Health Maintenance  Topic Date Due   Complete foot exam   Never done   Eye exam for diabetics  Never done   Hepatitis C Screening  Never done   COVID-19 Vaccine (8 - 2023-24 season) 09/25/2021   Flu Shot  08/26/2022   Hemoglobin A1C  11/17/2022   Yearly kidney function blood test for diabetes  05/18/2023   Yearly kidney health urinalysis for diabetes  05/18/2023   Medicare Annual Wellness Visit  06/30/2023   Mammogram  01/29/2024   DTaP/Tdap/Td vaccine (3 - Td or Tdap) 02/07/2029   Colon Cancer Screening  11/06/2031   Pneumonia Vaccine  Completed   DEXA scan (bone density measurement)  Completed   Zoster (Shingles) Vaccine  Completed   HPV Vaccine  Aged Out    Advanced directives: Please bring a copy of your POA (Power of [place]) and/or Living Will to your next appointment.   Conditions/risks identified: none  Next appointment: Follow up in one year for your annual wellness visit    Preventive Care 65 Years and Older, Female Preventive care refers to lifestyle choices and visits with your health care provider that can promote health and wellness. What does preventive care include? A yearly physical exam. This is also called an annual well check. Dental exams once or twice a year. Routine eye exams. Ask your health care provider how often you should have your eyes checked. Personal lifestyle choices, including: Daily care of your teeth and gums. Regular physical  activity. Eating a healthy diet. Avoiding tobacco and drug use. Limiting alcohol use. Practicing safe sex. Taking low-dose aspirin every day. Taking vitamin and mineral supplements as recommended by your health care provider. What happens during an annual well check? The services and screenings done by your health care provider during your annual well check will depend on your age, overall health, lifestyle risk factors, and family history of disease. Counseling  Your health care provider may ask you questions about your: Alcohol use. Tobacco use. Drug use. Emotional well-being. Home and relationship well-being. Sexual activity. Eating habits. History of falls. Memory and ability to understand (cognition). Work and work Astronomer. Reproductive health. Screening  You may have the following tests or measurements: Height, weight, and BMI. Blood pressure. Lipid and cholesterol levels. These may be checked every 5 years, or more frequently if you are over 63 years old. Skin check. Lung cancer screening. You may have this screening every year starting at age 69 if you have a 30-pack-year history of smoking and currently smoke or have quit within the past 15 years. Fecal occult blood test (FOBT) of the stool. You may have this test every year starting at age 32. Flexible sigmoidoscopy or colonoscopy. You may have a sigmoidoscopy every 5 years or a colonoscopy every 10 years starting at age 22. Hepatitis C blood test. Hepatitis B blood test. Sexually transmitted  disease (STD) testing. Diabetes screening. This is done by checking your blood sugar (glucose) after you have not eaten for a while (fasting). You may have this done every 1-3 years. Bone density scan. This is done to screen for osteoporosis. You may have this done starting at age 20. Mammogram. This may be done every 1-2 years. Talk to your health care provider about how often you should have regular mammograms. Talk with your  health care provider about your test results, treatment options, and if necessary, the need for more tests. Vaccines  Your health care provider may recommend certain vaccines, such as: Influenza vaccine. This is recommended every year. Tetanus, diphtheria, and acellular pertussis (Tdap, Td) vaccine. You may need a Td booster every 10 years. Zoster vaccine. You may need this after age 24. Pneumococcal 13-valent conjugate (PCV13) vaccine. One dose is recommended after age 17. Pneumococcal polysaccharide (PPSV23) vaccine. One dose is recommended after age 42. Talk to your health care provider about which screenings and vaccines you need and how often you need them. This information is not intended to replace advice given to you by your health care provider. Make sure you discuss any questions you have with your health care provider. Document Released: 02/07/2015 Document Revised: 10/01/2015 Document Reviewed: 11/12/2014 Elsevier Interactive Patient Education  2017 [employer].  Fall Prevention in the Home Falls can cause injuries. They can happen to people of all ages. There are many things you can do to make your home safe and to help prevent falls. What can I do on the outside of my home? Regularly fix the edges of walkways and driveways and fix any cracks. Remove anything that might make you trip as you walk through a door, such as a raised step or threshold. Trim any bushes or trees on the path to your home. Use bright outdoor lighting. Clear any walking paths of anything that might make someone trip, such as rocks or tools. Regularly check to see if handrails are loose or broken. Make sure that both sides of any steps have handrails. Any raised decks and porches should have guardrails on the edges. Have any leaves, snow, or ice cleared regularly. Use sand or salt on walking paths during winter. Clean up any spills in your garage right away. This includes oil or grease spills. What can I  do in the bathroom? Use night lights. Install grab bars by the toilet and in the tub and shower. Do not use towel bars as grab bars. Use non-skid mats or decals in the tub or shower. If you need to sit down in the shower, use a plastic, non-slip stool. Keep the floor dry. Clean up any water that spills on the floor as soon as it happens. Remove soap buildup in the tub or shower regularly. Attach bath mats securely with double-sided non-slip rug tape. Do not have throw rugs and other things on the floor that can make you trip. What can I do in the bedroom? Use night lights. Make sure that you have a light by your bed that is easy to reach. Do not use any sheets or blankets that are too big for your bed. They should not hang down onto the floor. Have a firm chair that has side arms. You can use this for support while you get dressed. Do not have throw rugs and other things on the floor that can make you trip. What can I do in the kitchen? Clean up any spills right away. Avoid walking  on wet floors. Keep items that you use a lot in easy-to-reach places. If you need to reach something above you, use a strong step stool that has a grab bar. Keep electrical cords out of the way. Do not use floor polish or wax that makes floors slippery. If you must use wax, use non-skid floor wax. Do not have throw rugs and other things on the floor that can make you trip. What can I do with my stairs? Do not leave any items on the stairs. Make sure that there are handrails on both sides of the stairs and use them. Fix handrails that are broken or loose. Make sure that handrails are as long as the stairways. Check any carpeting to make sure that it is firmly attached to the stairs. Fix any carpet that is loose or worn. Avoid having throw rugs at the top or bottom of the stairs. If you do have throw rugs, attach them to the floor with carpet tape. Make sure that you have a light switch at the top of the stairs  and the bottom of the stairs. If you do not have them, ask someone to add them for you. What else can I do to help prevent falls? Wear shoes that: Do not have high heels. Have rubber bottoms. Are comfortable and fit you well. Are closed at the toe. Do not wear sandals. If you use a stepladder: Make sure that it is fully opened. Do not climb a closed stepladder. Make sure that both sides of the stepladder are locked into place. Ask someone to hold it for you, if possible. Clearly mark and make sure that you can see: Any grab bars or handrails. First and last steps. Where the edge of each step is. Use tools that help you move around (mobility aids) if they are needed. These include: Canes. Walkers. Scooters. Crutches. Turn on the lights when you go into a dark area. Replace any light bulbs as soon as they burn out. Set up your furniture so you have a clear path. Avoid moving your furniture around. If any of your floors are uneven, fix them. If there are any pets around you, be aware of where they are. Review your medicines with your doctor. Some medicines can make you feel dizzy. This can increase your chance of falling. Ask your doctor what other things that you can do to help prevent falls. This information is not intended to replace advice given to you by your health care provider. Make sure you discuss any questions you have with your health care provider. Document Released: 11/07/2008 Document Revised: 06/19/2015 Document Reviewed: 02/15/2014 Elsevier Interactive Patient Education  2017 [employer].

## 2022-06-30 NOTE — Progress Notes (Signed)
Subjective:   Chelsea Landry is a 67 y.o. female who presents for Medicare Annual (Subsequent) preventive examination.  Review of Systems     Cardiac Risk Factors include: advanced age (>69men, >24 women);diabetes mellitus;hypertension     Objective:    Today's Vitals   06/30/22 1515  BP: 112/64  Pulse: 76  Temp: 97.6 F (36.4 C)  TempSrc: Oral  SpO2: 98%  Weight: 158 lb 9.6 oz (71.9 kg)  Height: 5' 1.8" (1.57 m)   Body mass index is 29.2 kg/m.     06/30/2022    3:26 PM 02/02/2016   10:30 PM 12/31/2013    6:30 PM 12/27/2013    9:47 AM 12/19/2013   10:42 AM 05/04/2013    9:27 AM  Advanced Directives  Does Patient Have a Medical Advance Directive? Yes No No No No Patient would like information  Type of Advance Directive Healthcare Power of [place];Living will       Copy of Healthcare Power of Attorney in Chart? No - copy requested       Would patient like information on creating a medical advance directive?   Yes - Transport planner given  Yes - Transport planner given Advance directive brochure given (Outpatient ONLY)    Current Medications (verified) Outpatient Encounter Medications as of 06/30/2022  Medication Sig   KRILL OIL PO Take 1 capsule by mouth daily.   losartan (COZAAR) 50 MG tablet Take 50 mg by mouth daily.   metoprolol succinate (TOPROL-XL) 25 MG 24 hr tablet Take 1 tablet (25 mg total) by mouth at bedtime.   Multiple Vitamin (MULTIVITAMIN WITH MINERALS) TABS tablet Take 1 tablet by mouth daily.   Semaglutide,0.25 or 0.5MG /DOS, (OZEMPIC, 0.25 OR 0.5 MG/DOSE,) 2 MG/3ML SOPN INJECT 0.50 MG INTO THE SKIN WEEKLY   VITAMIN D PO Take by mouth. (Patient not taking: Reported on 06/30/2022)   No facility-administered encounter medications on file as of 06/30/2022.    Allergies (verified) Patient has no known allergies.   History: Past Medical History:  Diagnosis Date   Fibroids    Frequency of urination    Hypertension    Rash    RT LEG   SVD  (spontaneous vaginal delivery)    x 2   Vaginal lesion    Past Surgical History:  Procedure Laterality Date   BREAST BIOPSY Right 05/13/2022   [place] RT BREAST BX W LOC DEV 1ST LESION IMG BX SPEC US GUIDE 05/13/2022 [facility]   BREATH TEK H PYLORI N/A 04/17/2013   Procedure: BREATH TEK H PYLORI;  Surgeon: Valarie Merino, MD;  Location: [facility];  Service: General;  Laterality: N/A;   CHOLECYSTECTOMY  2001   COLONOSCOPY  2006   DILATATION & CURETTAGE/HYSTEROSCOPY WITH MYOSURE N/A 12/27/2013   Procedure: DILATATION & CURETTAGE/HYSTEROSCOPY WITH MYOSURE;  Surgeon: Jaymes Graff, MD;  Location: [facility];  Service: Gynecology;  Laterality: N/A;   HYSTEROSCOPY     LAPAROSCOPIC GASTRIC SLEEVE RESECTION N/A 12/31/2013   Procedure: LAPAROSCOPIC GASTRIC SLEEVE RESECTION w/Upper GI/gen and hiatal hernia repair;  Surgeon: Valarie Merino, MD;  Location: [facility];  Service: General;  Laterality: N/A;   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   Family History  Problem Relation Age of Onset   Leukemia Mother    Acute lymphoblastic leukemia Mother    Aneurysm Father    Anuerysm Father    Lung cancer Sister    Breast cancer Sister    Cancer - Lung Sister  Brain cancer Brother    Breast cancer Paternal Aunt        unknown age   Hypertension Other    Cancer Maternal Aunt    Social History   Socioeconomic History   Marital status: Married    Spouse name: Not on file   Number of children: Not on file   Years of education: Not on file   Highest education level: Not on file  Occupational History   Occupation: Retired  Tobacco Use   Smoking status: Never   Smokeless tobacco: Never  Vaping Use   Vaping Use: Never used  Substance and Sexual Activity   Alcohol use: No   Drug use: No   Sexual activity: Yes    Birth control/protection: Post-menopausal, Surgical    Comment: husband vasectomy  Other Topics Concern   Not on file  Social History Narrative   Not on file   Social  Determinants of Health   Financial Resource Strain: Low Risk  (06/30/2022)   Overall Financial Resource Strain (CARDIA)    Difficulty of Paying Living Expenses: Not hard at all  Food Insecurity: No Food Insecurity (06/30/2022)   Hunger Vital Sign    Worried About Running Out of Food in the Last Year: Never true    Ran Out of Food in the Last Year: Never true  Transportation Needs: No Transportation Needs (06/30/2022)   PRAPARE - Administrator, Civil Service (Medical): No    Lack of Transportation (Non-Medical): No  Physical Activity: Sufficiently Active (06/30/2022)   Exercise Vital Sign    Days of Exercise per Week: 4 days    Minutes of Exercise per Session: 50 min  Stress: Stress Concern Present (06/30/2022)   [employer] of Occupational Health - Occupational Stress Questionnaire    Feeling of Stress : To some extent  Social Connections: Not on file    Tobacco Counseling Counseling given: Not Answered   Clinical Intake:  Pre-visit preparation completed: Yes  Pain : No/denies pain     Nutritional Status: BMI 25 -29 Overweight Nutritional Risks: None  How often do you need to have someone help you when you read instructions, pamphlets, or other written materials from your doctor or pharmacy?: 1 - Never  Diabetic? Yes Nutrition Risk Assessment:  Has the patient had any N/V/D within the last 2 months?  No  Does the patient have any non-healing wounds?  No  Has the patient had any unintentional weight loss or weight gain?  No   Diabetes:  Is the patient diabetic?  Yes  If diabetic, was a CBG obtained today?  No  Did the patient bring in their glucometer from home?  No  How often do you monitor your CBG's? Does not.   Financial Strains and Diabetes Management:  Are you having any financial strains with the device, your supplies or your medication? No .  Does the patient want to be seen by Chronic Care Management for management of their diabetes?  No   Would the patient like to be referred to a Nutritionist or for Diabetic Management?  No   Diabetic Exams:  Diabetic Eye Exam: Completed 2024 Diabetic Foot Exam: Overdue, Pt has been advised about the importance in completing this exam. Pt is scheduled for diabetic foot exam on next appointment.  Interpreter Needed?: No  Information entered by :: NAllen LPN   Activities of Daily Living    06/30/2022    3:27 PM  In your present state of  health, do you have any difficulty performing the following activities:  Hearing? 0  Vision? 1  Comment blurry at times  Difficulty concentrating or making decisions? 1  Comment trouble finding words  Walking or climbing stairs? 0  Dressing or bathing? 0  Doing errands, shopping? 0  Preparing Food and eating ? N  Using the Toilet? N  In the past six months, have you accidently leaked urine? Y  Do you have problems with loss of bowel control? N  Managing your Medications? N  Managing your Finances? N  Housekeeping or managing your Housekeeping? N    Patient Care Team: Dorothyann Peng, MD as PCP - General (Internal Medicine)  Indicate any recent Medical Services you may have received from other than [facility] providers in the past year (date may be approximate).     Assessment:   This is a routine wellness examination for Sissie.  Hearing/Vision screen Vision Screening - Comments:: Regular eye exams, [facility]  Dietary issues and exercise activities discussed: Current Exercise Habits: Structured exercise class, Type of exercise: calisthenics;strength training/weights;Other - see comments (water aerobics), Time (Minutes): 45, Frequency (Times/Week): 4, Weekly Exercise (Minutes/Week): 180   Goals Addressed             This Visit's Progress    Patient Stated       06/30/2022, make sure arteries do not get clogged, wants to drink more water, make sure kidneys are functioning and tumors are not growing       Depression Screen     06/30/2022    3:27 PM 05/18/2022    2:16 PM 02/25/2016    6:01 PM 09/22/2015    5:43 PM 07/25/2015   10:06 AM 06/11/2015    2:03 PM 04/24/2015    5:21 PM  PHQ 2/9 Scores  PHQ - 2 Score 0 0 0 0 0 0 0  PHQ- 9 Score  0         Fall Risk    06/30/2022    3:26 PM 05/18/2022    2:21 PM 02/25/2016    6:01 PM 09/22/2015    5:43 PM 07/25/2015   10:06 AM  Fall Risk   Falls in the past year? 0 0 No No No  Number falls in past yr: 0 0     Injury with Fall? 0 0     Risk for fall due to : Medication side effect No Fall Risks     Follow up Falls prevention discussed;Education provided;Falls evaluation completed Falls evaluation completed       FALL RISK PREVENTION PERTAINING TO THE HOME:  Any stairs in or around the home? Yes  If so, are there any without handrails? No  Home free of loose throw rugs in walkways, pet beds, electrical cords, etc? Yes  Adequate lighting in your home to reduce risk of falls? Yes   ASSISTIVE DEVICES UTILIZED TO PREVENT FALLS:  Life alert? No  Use of a cane, walker or w/c? No  Grab bars in the bathroom? Yes  Shower chair or bench in shower? No  Elevated toilet seat or a handicapped toilet? Yes   TIMED UP AND GO:  Was the test performed? Yes .  Length of time to ambulate 10 feet: 5 sec.   Gait steady and fast without use of assistive device  Cognitive Function:        06/30/2022    3:29 PM  6CIT Screen  What Year? 0 points  What month? 0 points  What time? 0 points  Count back from 20 0 points  Months in reverse 0 points  Repeat phrase 0 points  Total Score 0 points    Immunizations Immunization History  Administered Date(s) Administered   Fluad Quad(high Dose 65+) 09/29/2021   Moderna Covid-19 Vaccine Bivalent Booster 7yrs & up 10/21/2020   Moderna Sars-Covid-2 Vaccination 03/29/2019, 05/01/2019, 11/28/2019, 07/01/2020   PNEUMOCOCCAL CONJUGATE-20 06/25/2021   Pneumococcal Polysaccharide-23 02/13/2020   Tdap 10/20/2010, 02/08/2019   Unspecified  SARS-COV-2 Vaccination 04/03/2019, 05/01/2019   Zoster Recombinat (Shingrix) 02/08/2019, 06/06/2019    TDAP status: Up to date  Flu Vaccine status: Up to date  Pneumococcal vaccine status: Up to date  Covid-19 vaccine status: Completed vaccines  Qualifies for Shingles Vaccine? Yes   Zostavax completed Yes   Shingrix Completed?: Yes  Screening Tests Health Maintenance  Topic Date Due   FOOT EXAM  Never done   OPHTHALMOLOGY EXAM  Never done   Hepatitis C Screening  Never done   COVID-19 Vaccine (8 - 2023-24 season) 09/25/2021   INFLUENZA VACCINE  08/26/2022   HEMOGLOBIN A1C  11/17/2022   Diabetic kidney evaluation - eGFR measurement  05/18/2023   Diabetic kidney evaluation - Urine ACR  05/18/2023   Medicare Annual Wellness (AWV)  06/30/2023   MAMMOGRAM  01/29/2024   DTaP/Tdap/Td (3 - Td or Tdap) 02/07/2029   Colonoscopy  11/06/2031   Pneumonia Vaccine 41+ Years old  Completed   DEXA SCAN  Completed   Zoster Vaccines- Shingrix  Completed   HPV VACCINES  Aged Out    Health Maintenance  Health Maintenance Due  Topic Date Due   FOOT EXAM  Never done   OPHTHALMOLOGY EXAM  Never done   Hepatitis C Screening  Never done   COVID-19 Vaccine (8 - 2023-24 season) 09/25/2021    Colorectal cancer screening: patient states had about 5 years  Mammogram status: Completed 04/2022. Repeat every year  Bone Density status: Completed 06/17/2020.   Lung Cancer Screening: (Low Dose CT Chest recommended if Age 6-80 years, 30 pack-year currently smoking OR have quit w/in 15years.) does not qualify.   Lung Cancer Screening Referral: no  Additional Screening:  Hepatitis C Screening: does qualify;   Vision Screening: Recommended annual ophthalmology exams for early detection of glaucoma and other disorders of the eye. Is the patient up to date with their annual eye exam?  Yes  Who is the provider or what is the name of the office in which the patient attends annual eye exams? [facility] [place] If pt is not established with a provider, would they like to be referred to a provider to establish care? No .   Dental Screening: Recommended annual dental exams for proper oral hygiene  Community Resource Referral / Chronic Care Management: CRR required this visit?  No   CCM required this visit?  No      Plan:     I have personally reviewed and noted the following in the patient's chart:   Medical and social history Use of alcohol, tobacco or illicit drugs  Current medications and supplements including opioid prescriptions. Patient is not currently taking opioid prescriptions. Functional ability and status Nutritional status Physical activity Advanced directives List of other physicians Hospitalizations, surgeries, and ER visits in previous 12 months Vitals Screenings to include cognitive, depression, and falls Referrals and appointments  In addition, I have reviewed and discussed with patient certain preventive protocols, quality metrics, and best practice recommendations. A written personalized care  plan for preventive services as well as general preventive health recommendations were provided to patient.     Barb Merino, LPN   01/30/1094   Nurse Notes: none

## 2022-07-12 ENCOUNTER — Telehealth (INDEPENDENT_AMBULATORY_CARE_PROVIDER_SITE_OTHER): Payer: Medicare HMO | Admitting: Internal Medicine

## 2022-07-12 ENCOUNTER — Encounter: Payer: Self-pay | Admitting: Internal Medicine

## 2022-07-12 DIAGNOSIS — W540XXA Bitten by dog, initial encounter: Secondary | ICD-10-CM

## 2022-07-12 DIAGNOSIS — L03011 Cellulitis of right finger: Secondary | ICD-10-CM | POA: Diagnosis not present

## 2022-07-12 DIAGNOSIS — S61459A Open bite of unspecified hand, initial encounter: Secondary | ICD-10-CM

## 2022-07-12 DIAGNOSIS — E7800 Pure hypercholesterolemia, unspecified: Secondary | ICD-10-CM | POA: Diagnosis not present

## 2022-07-12 MED ORDER — AMOXICILLIN-POT CLAVULANATE 875-125 MG PO TABS: 1.0000 | ORAL_TABLET | Freq: Two times a day (BID) | ORAL | 0 refills | Status: DC

## 2022-07-12 NOTE — Progress Notes (Signed)
Virtual Visit via Video   This visit type was conducted due to national recommendations for restrictions regarding the COVID-19 Pandemic (e.g. social distancing) in an effort to limit this patient's exposure and mitigate transmission in our community.  Due to her co-morbid illnesses, this patient is at least at moderate risk for complications without adequate follow up.  This format is felt to be most appropriate for this patient at this time.  All issues noted in this document were discussed and addressed.  A limited physical exam was performed with this format.    This visit type was conducted due to national recommendations for restrictions regarding the COVID-19 Pandemic (e.g. social distancing) in an effort to limit this patient's exposure and mitigate transmission in our community.  Patients identity confirmed using two different identifiers.  This format is felt to be most appropriate for this patient at this time.  All issues noted in this document were discussed and addressed.  No physical exam was performed (except for noted visual exam findings with Video Visits).    Date:  07/12/2022   ID:  GEVENA CORRALES, DOB [birth year], MRN [id number]  Patient Location:  Home, front porch  Provider location:   Office    Chief Complaint:  "I have a dog bite"  History of Present Illness:    SALVATRICE SUPPLE is a 67 y.o. female who presents via video conferencing for a telehealth visit today.    The patient does not have symptoms concerning for COVID-19 infection (fever, chills, cough, or new shortness of breath).   Patient presents virtually today for dog bites. She agrees to this type of visit. She states she was bitten by her dog last night. She states he punctured her skin in four different places. He bit both hands. She states she was trying to get some paper out of his mouth.  She reports her dog is up to date on all of his vaccines. She admits keeping area clean & she has applied  neosporin.   Animal Bite  The incident occurred yesterday. The incident occurred at home. There is an injury to the Right long finger, left thumb and left index finger. The pain is mild. It is unlikely that a foreign body is present. Pertinent negatives include no visual disturbance and no headaches.     Past Medical History:  Diagnosis Date   Fibroids    Frequency of urination    Hypertension    Rash    RT LEG   SVD (spontaneous vaginal delivery)    x 2   Vaginal lesion    Past Surgical History:  Procedure Laterality Date   BREAST BIOPSY Right 05/13/2022   [place] RT BREAST BX W LOC DEV 1ST LESION IMG BX SPEC US GUIDE 05/13/2022 [facility]   BREATH TEK H PYLORI N/A 04/17/2013   Procedure: BREATH TEK H PYLORI;  Surgeon: Valarie Merino, MD;  Location: [facility];  Service: General;  Laterality: N/A;   CHOLECYSTECTOMY  2001   COLONOSCOPY  2006   DILATATION & CURETTAGE/HYSTEROSCOPY WITH MYOSURE N/A 12/27/2013   Procedure: DILATATION & CURETTAGE/HYSTEROSCOPY WITH MYOSURE;  Surgeon: Jaymes Graff, MD;  Location: [facility];  Service: Gynecology;  Laterality: N/A;   HYSTEROSCOPY     LAPAROSCOPIC GASTRIC SLEEVE RESECTION N/A 12/31/2013   Procedure: LAPAROSCOPIC GASTRIC SLEEVE RESECTION w/Upper GI/gen and hiatal hernia repair;  Surgeon: Valarie Merino, MD;  Location: [facility];  Service: General;  Laterality: N/A;   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   KRILL OIL PO Take 1 capsule by mouth daily.   losartan (COZAAR) 50 MG tablet Take 50 mg by mouth daily.   metoprolol succinate (TOPROL-XL) 25 MG 24 hr tablet Take 1 tablet (25 mg total) by mouth at bedtime.   Multiple Vitamin (MULTIVITAMIN WITH MINERALS) TABS tablet Take 1 tablet by mouth daily.   Semaglutide,0.25 or 0.5MG /DOS, (OZEMPIC, 0.25 OR 0.5 MG/DOSE,) 2 MG/3ML SOPN INJECT 0.50 MG INTO THE SKIN  WEEKLY     Allergies:   Patient has no known allergies.   Social History   Tobacco Use   Smoking status: Never   Smokeless tobacco: Never  Vaping Use   Vaping Use: Never used  Substance Use Topics   Alcohol use: No   Drug use: No     Family Hx: The patient's family history includes Acute lymphoblastic leukemia in her mother; Aneurysm in her father; Anuerysm in her father; Brain cancer in her brother; Breast cancer in her paternal aunt and sister; Cancer in her maternal aunt; Cancer - Lung in her sister; Hypertension in an other family member; Leukemia in her mother; Lung cancer in her sister.  ROS:   Please see the history of present illness.    Review of Systems  Constitutional: Negative.   HENT: Negative.    Eyes:  Negative for visual disturbance.  Respiratory: Negative.    Cardiovascular: Negative.   Gastrointestinal: Negative.   Musculoskeletal: Negative.   Neurological: Negative.  Negative for headaches.  Endo/Heme/Allergies: Negative.   Psychiatric/Behavioral: Negative.      All other systems reviewed and are negative.   Labs/Other Tests and Data Reviewed:    Recent Labs: 05/18/2022: ALT 44; BUN 14; Creatinine, Ser 0.87; Hemoglobin 14.2; Platelets 273; Potassium 4.3; Sodium 142   Recent Lipid Panel Lab Results  Component Value Date/Time   CHOL 197 05/18/2022 03:39 PM   TRIG 84 05/18/2022 03:39 PM   HDL 68 05/18/2022 03:39 PM   CHOLHDL 2.9 05/18/2022 03:39 PM   CHOLHDL 3.9 05/14/2011 12:00 AM   LDLCALC 114 (H) 05/18/2022 03:39 PM    Wt Readings from Last 3 Encounters:  06/30/22 158 lb 9.6 oz (71.9 kg)  05/18/22 161 lb 6.4 oz (73.2 kg)  04/20/22 156 lb (70.8 kg)    The 10-year ASCVD risk score (Arnett DK, et al., 2019) is: 22%   Values used to calculate the score:     Age: 89 years     Sex: Female     Is Non-Hispanic African American: Yes     Diabetic: Yes     Tobacco smoker: No     Systolic Blood Pressure: 128 mmHg     Is BP treated: Yes     HDL  Cholesterol: 68 mg/dL     Total Cholesterol: 197 mg/dL  Exam:    Vital Signs:  There were no vitals taken for this visit.    Physical Exam Vitals and nursing note reviewed.  Constitutional:      Appearance: Normal appearance.  HENT:     Head: Normocephalic and atraumatic.  Eyes:     Extraocular Movements: Extraocular movements intact.  Pulmonary:     Effort: Pulmonary effort is normal.  Musculoskeletal:     Cervical back: Normal range of motion.  Skin:    Comments: Puncture wounds noted on R middle finger, left thumb.  Erythema on R middle finger  Neurological:     Mental Status: She is alert and oriented to person, place, and time.  Psychiatric:        Mood and Affect: Affect normal.     ASSESSMENT & PLAN:    1. Dog bite of hand, unspecified laterality, initial encounter Comments: Occurred on 07/11/22.  She agrees to take rx Augmentin 875mg  twice daily. Advised to given an update in 48 hours.  2. Cellulitis of right middle finger Comments: I will send rx Augmentin 875mg  to take twice daily x 10 days. Encouraged to take full course.  3. Pure hypercholesterolemia Comments: She agrees to move forward with calcium scoring. She is aware of $99 fee. - CT CARDIAC SCORING (SELF PAY ONLY); Future     COVID-19 Education: The signs and symptoms of COVID-19 were discussed with the patient and how to seek care for testing (follow up with PCP or arrange E-visit).  The importance of social distancing was discussed today.  Patient Risk:   After full review of this patients clinical status, I feel that they are at least moderate risk at this time.  Time:   Today, I have spent 10 minutes/ seconds with the patient with telehealth technology discussing above diagnoses.     Medication Adjustments/Labs and Tests Ordered: Current medicines are reviewed at length with the patient today.  Concerns regarding medicines are outlined above.   Tests Ordered: Orders Placed This Encounter   Procedures   CT CARDIAC SCORING (SELF PAY ONLY)    Medication Changes: Meds ordered this encounter  Medications   amoxicillin-clavulanate (AUGMENTIN) 875-125 MG tablet    Sig: Take 1 tablet by mouth 2 (two) times daily.    Dispense:  20 tablet    Refill:  0    Disposition:  Follow up prn  Signed, [name], MD

## 2022-07-12 NOTE — Patient Instructions (Addendum)
Coronary Calcium Scan A coronary calcium scan is an imaging test used to look for deposits of plaque in the inner lining of the blood vessels of the heart (coronary arteries). Plaque is made up of calcium, protein, and fatty substances. These deposits of plaque can partly clog and narrow the coronary arteries without producing any symptoms or warning signs. This puts a person at risk for a heart attack. A coronary calcium scan is performed using a computed tomography (CT) scanner machine without using a dye (contrast). This test is recommended for people who are at moderate risk for heart disease. The test can find plaque deposits before symptoms develop. Tell a health care provider about: Any allergies you have. All medicines you are taking, including vitamins, herbs, eye drops, creams, and over-the-counter medicines. Any problems you or family members have had with anesthetic medicines. Any bleeding problems you have. Any surgeries you have had. Any medical conditions you have. Whether you are pregnant or may be pregnant. What are the risks? Generally, this is a safe procedure. However, problems may occur, including: Harm to a pregnant woman and her unborn baby. This test involves the use of radiation. Radiation exposure can be dangerous to a pregnant woman and her unborn baby. If you are pregnant or think you may be pregnant, you should not have this procedure done. A slight increase in the risk of cancer. This is because of the radiation involved in the test. The amount of radiation from one test is similar to the amount of radiation you are naturally exposed to over one year. What happens before the procedure? Ask your health care provider for any specific instructions on how to prepare for this procedure. You may be asked to avoid products that contain caffeine, tobacco, or nicotine for 4 hours before the procedure. What happens during the procedure?  You will undress and remove any jewelry  from your neck or chest. You may need to remove hearing aides and dentures. Women may need to remove their bras. You will put on a hospital gown. Sticky electrodes will be placed on your chest. The electrodes will be connected to an electrocardiogram (ECG) machine to record a tracing of the electrical activity of your heart. You will lie down on your back on a curved bed that is attached to the CT scanner. You may be given medicine to slow down your heart rate so that clear pictures can be created. You will be moved into the CT scanner, and the CT scanner will take pictures of your heart. During this time, you will be asked to lie still and hold your breath for 10-20 seconds at a time while each picture of your heart is being taken. The procedure may vary among health care providers and hospitals. What can I expect after the procedure? You can return to your normal activities. It is up to you to get the results of your procedure. Ask your health care provider, or the department that is doing the procedure, when your results will be ready. Summary A coronary calcium scan is an imaging test used to look for deposits of plaque in the inner lining of the blood vessels of the heart. Plaque is made up of calcium, protein, and fatty substances. A coronary calcium scan is performed using a CT scanner machine without contrast. Generally, this is a safe procedure. Tell your health care provider if you are pregnant or may be pregnant. Ask your health care provider for any specific instructions on how to   prepare for this procedure. You can return to your normal activities after the scan is done. This information is not intended to replace advice given to you by your health care provider. Make sure you discuss any questions you have with your health care provider. Document Revised: 12/21/2020 Document Reviewed: 12/21/2020 Elsevier Patient Education  2024 Elsevier Inc.   Lobbyist, Adult Animal bites can  be mild or serious. Small bites from house pets normally are mild. Bites from cats, strays, or wild animals can be serious. If a stray or wild animal bites you, you need to get medical help right away. You may also need a shot to prevent rabies infection. What increases the risk? Being near pets you do not know. Being near animals that are eating, sleeping, or caring for their babies. Being outside where small, wild animals move freely. What are the signs or symptoms? Pain. Bleeding. Swelling. Bruising. How is this treated? Treatment may include: Cleaning your wound. Rinsing out (flushing) your wound. This uses saline solution, which is made of salt and water. Putting a bandage on your wound. Closing your wound with stitches (sutures), staples, skin glue, or skin tape (adhesive strips). Antibiotic medicine. You may be given pills, cream, gel, or fluid through an IV. A tetanus shot. Rabies treatment, if the animal could have rabies. Surgery, if there is infection or damage that needs to be fixed. Follow these instructions at home: Medicines Take or apply over-the-counter and prescription medicines only as told by your doctor. If you were prescribed an antibiotic medicine, take or apply it as told by your doctor. Do not stop using it even if your wound gets better. Wound care  Follow instructions from your doctor about how to take care of your wound. Make sure you: Wash your hands with soap and water for at least 20 seconds before and after you change your bandage. If you cannot use soap and water, use hand sanitizer. Change your bandage. Leave stitches or skin glue in place for at least 2 weeks. Leave tape strips alone unless you are told to take them off. You may trim the edges of the tape strips if they curl up. Check your wound every day for signs of infection. Check for: More redness, swelling, or pain. More fluid or blood. Warmth. Pus or a bad smell. General  instructions  Raise (elevate) the injured area above the level of your heart while you are sitting or lying down. If told, put ice on the injured area. To do this: Put ice in a plastic bag. Place a towel between your skin and the bag. Leave the ice on for 20 minutes, 2-3 times per day. Take off the ice if your skin turns bright red. This is very important. If you cannot feel pain, heat, or cold, you have a greater risk of damage to the area. Keep all follow-up visits. Contact a doctor if: You have more redness, swelling, or pain around your wound. Your wound feels warm to the touch. You have a fever or chills. You have a general feeling of sickness (malaise). You feel like you may vomit. You vomit. You have pain that does not get better. Get help right away if: You have a red streak going away from your wound. You have any of these coming from your wound: Non-clear fluid. More blood. Pus or a bad smell. You have trouble moving your injured area. You lose feeling (have numbness) or feel tingling anywhere on your body. Summary Animal bites  can be mild or serious. If a stray or wild animal bites you, you need to get medical help right away. Your doctor will look at the wound and may ask about how the animal bite happened. Treatment may include wound care, antibiotic medicine, a tetanus shot, and rabies treatment. This information is not intended to replace advice given to you by your health care provider. Make sure you discuss any questions you have with your health care provider. Document Revised: 01/16/2021 Document Reviewed: 01/16/2021 Elsevier Patient Education  2024 [employer].

## 2022-07-13 ENCOUNTER — Other Ambulatory Visit: Payer: Self-pay

## 2022-07-13 ENCOUNTER — Inpatient Hospital Stay: Payer: Medicare HMO | Attending: Genetic Counselor | Admitting: Genetic Counselor

## 2022-07-13 ENCOUNTER — Encounter: Payer: Self-pay | Admitting: Genetic Counselor

## 2022-07-13 ENCOUNTER — Inpatient Hospital Stay: Payer: Medicare HMO

## 2022-07-13 DIAGNOSIS — Z809 Family history of malignant neoplasm, unspecified: Secondary | ICD-10-CM

## 2022-07-13 LAB — GENETIC SCREENING ORDER

## 2022-07-14 NOTE — Progress Notes (Signed)
REFERRING PROVIDER: Dorothyann Peng, MD [address] [place],  [place] [address]  PRIMARY PROVIDER:  Dorothyann Peng, MD  PRIMARY REASON FOR VISIT:  1. Family history of cancer    HISTORY OF PRESENT ILLNESS:   Ms. Virrueta, a 67 y.o. female, was seen for a [place] cancer genetics consultation at the request of Dr. Allyne Gee due to a family history of cancer.  Ms. Gloor presents to clinic today to discuss the possibility of a hereditary predisposition to cancer, to discuss genetic testing, and to further clarify her future cancer risks, as well as potential cancer risks for family members.   Ms. Glazer is a 67 y.o. female with no personal history of cancer.    RISK FACTORS:  Menarche was at age 82.  First live birth at age 68.  OCP use for approximately 5-6 years.  Ovaries intact: yes.  Uterus intact: yes.  Menopausal status: postmenopausal.  HRT use: 0 years. Colonoscopy: yes;  she reports a history of 2-3 polyps . Mammogram within the last year: yes. Number of breast biopsies: 1 biopsy in 2024- fibroadipose tissue with benign stromal fibrosis   Up to date with pelvic exams: yes. Any excessive radiation exposure in the past: no  Past Medical History:  Diagnosis Date   Fibroids    Frequency of urination    Hypertension    Rash    RT LEG   SVD (spontaneous vaginal delivery)    x 2   Vaginal lesion     Past Surgical History:  Procedure Laterality Date   BREAST BIOPSY Right 05/13/2022   [place] RT BREAST BX W LOC DEV 1ST LESION IMG BX SPEC US GUIDE 05/13/2022 [facility]AMMO[facility]   BREATH TEK H PYLORI N/A 04/17/2013   Procedure: BREATH TEK H PYLORI;  Surgeon: Valarie Merino, MD;  Location: [facility];  Service: General;  Laterality: N/A;   CHOLECYSTECTOMY  2001   COLONOSCOPY  2006   DILATATION & CURETTAGE/HYSTEROSCOPY WITH MYOSURE N/A 12/27/2013   Procedure: DILATATION & CURETTAGE/HYSTEROSCOPY WITH MYOSURE;  Surgeon: Jaymes Graff, MD;  Location: [facility];  Service:  Gynecology;  Laterality: N/A;   HYSTEROSCOPY     LAPAROSCOPIC GASTRIC SLEEVE RESECTION N/A 12/31/2013   Procedure: LAPAROSCOPIC GASTRIC SLEEVE RESECTION w/Upper GI/gen and hiatal hernia repair;  Surgeon: Valarie Merino, MD;  Location: [facility];  Service: General;  Laterality: N/A;   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    Social History   Socioeconomic History   Marital status: Married    Spouse name: Not on file   Number of children: Not on file   Years of education: Not on file   Highest education level: Not on file  Occupational History   Occupation: Retired  Tobacco Use   Smoking status: Never   Smokeless tobacco: Never  Vaping Use   Vaping Use: Never used  Substance and Sexual Activity   Alcohol use: No   Drug use: No   Sexual activity: Yes    Birth control/protection: Post-menopausal, Surgical    Comment: husband vasectomy  Other Topics Concern   Not on file  Social History Narrative   Not on file   Social Determinants of Health   Financial Resource Strain: Low Risk  (06/30/2022)   Overall Financial Resource Strain (CARDIA)    Difficulty of Paying Living Expenses: Not hard at all  Food Insecurity: No Food Insecurity (06/30/2022)   Hunger Vital Sign    Worried About Programme researcher, broadcasting/film/video in  the Last Year: Never true    Ran Out of Food in the Last Year: Never true  Transportation Needs: No Transportation Needs (06/30/2022)   PRAPARE - Administrator, Civil Service (Medical): No    Lack of Transportation (Non-Medical): No  Physical Activity: Sufficiently Active (06/30/2022)   Exercise Vital Sign    Days of Exercise per Week: 4 days    Minutes of Exercise per Session: 50 min  Stress: Stress Concern Present (06/30/2022)   [employer] of Occupational Health - Occupational Stress Questionnaire    Feeling of Stress : To some extent  Social Connections: Not on file     FAMILY HISTORY:  We obtained a detailed, 4-generation family history.  Significant  diagnoses are listed below: Family History  Problem Relation Age of Onset   Acute lymphoblastic leukemia Mother 21   Aneurysm Father    Anuerysm Father    Lung cancer Sister 47       non-smoker   Brain cancer Brother 17   Brain cancer Maternal Aunt    Breast cancer Paternal Aunt        dx.>50   Hypertension Other      Ms. Paulis is unaware of previous family history of genetic testing for hereditary cancer risks. There is no reported Ashkenazi Jewish ancestry.   GENETIC COUNSELING ASSESSMENT: Ms. Barthell is a 67 y.o. female with a family history of cancer which is somewhat suggestive of a hereditary predisposition to cancer. We, therefore, discussed and recommended the following at today's visit.   DISCUSSION: We discussed that 5 - 10% of cancer is hereditary, with most cases of hereditary breast cancer associated with BRCA1/2.  There are other genes that can be associated with hereditary breast cancer syndromes.  We discussed that testing is beneficial for several reasons, including knowing about other cancer risks, identifying potential screening and risk-reduction options that may be appropriate, and to understanding if other family members could be at risk for cancer and allowing them to undergo genetic testing.  We reviewed the characteristics, features and inheritance patterns of hereditary cancer syndromes. We also discussed genetic testing, including the appropriate family members to test, the process of testing, insurance coverage and turn-around-time for results. We discussed the implications of a negative, positive, carrier and/or variant of uncertain significant result. We discussed that negative results would be uninformative given that Ms. Raybould does not have a personal history of cancer.   Ms. Ridpath was offered a common hereditary cancer panel (34 genes) and an expanded pan-cancer panel (71 genes). Ms. Fetcho was informed of the benefits and limitations of each panel,  including that expanded pan-cancer panels contain several genes that do not have clear management guidelines at this point in time.  We also discussed that as the number of genes included on a panel increases, the chances of variants of uncertain significance increases.  After considering the benefits and limitations of each gene panel, Ms. Foti elected to have [facility] CancerNext-Expanded Panel.   We discussed with Ms. Perovich that the family history does not meet insurance or NCCN criteria for genetic testing and, therefore, is not highly consistent with a familial hereditary cancer syndrome.  We feel she is at low risk to harbor a gene mutation associated with such a condition. Thus, we did not recommend any genetic testing, at this time, and recommended Ms. Tausch continue to follow the cancer screening guidelines given by her primary healthcare provider. However, she was still interested in pursuing testing  for the benefit of herself and her family members.   We discussed that some people do not want to undergo genetic testing due to fear of genetic discrimination.  A federal law called the Genetic Information Non-Discrimination Act (GINA) of 2008 helps protect individuals against genetic discrimination based on their genetic test results.  It impacts both health insurance and employment.  With health insurance, it protects against increased premiums, being kicked off insurance or being forced to take a test in order to be insured.  For employment it protects against hiring, firing and promoting decisions based on genetic test results.  GINA does not apply to those in the [employer], those who work for companies with less than 15 employees, and new life insurance or long-term disability insurance policies.  Health status due to a cancer diagnosis is not protected under GINA.  PLAN: After considering the risks, benefits, and limitations, Ms. Lockwood provided informed consent to pursue genetic testing and the  blood sample was sent to [facility] for analysis of the CancerNext-Expanded Panel. Results should be available within approximately 2-3 weeks' time, at which point they will be disclosed by telephone to Ms. Kelsh, as will any additional recommendations warranted by these results. Ms. Apollo will receive a summary of her genetic counseling visit and a copy of her results once available. This information will also be available in Epic.   Ms. Petry questions were answered to her satisfaction today. Our contact information was provided should additional questions or concerns arise. Thank you for the referral and allowing [place] to share in the care of your patient.   Lalla Brothers, MS, [facility] Genetic Counselor [email or web address] (P) [phone]  The patient was seen for a total of 35 minutes in face-to-face genetic counseling. The patient was seen alone.  Drs. Pamelia Hoit and/or Mosetta Putt were available to discuss this case as needed.  _______________________________________________________________________ For Office Staff:  Number of people involved in session: 1 Was an Intern/ student involved with case: yes, Maddy

## 2022-07-21 ENCOUNTER — Other Ambulatory Visit: Payer: Self-pay

## 2022-07-21 DIAGNOSIS — E1165 Type 2 diabetes mellitus with hyperglycemia: Secondary | ICD-10-CM

## 2022-07-21 MED ORDER — OZEMPIC (0.25 OR 0.5 MG/DOSE) 2 MG/3ML ~~LOC~~ SOPN
PEN_INJECTOR | SUBCUTANEOUS | 1 refills | Status: AC
Start: 2022-07-21 — End: ?

## 2022-07-26 ENCOUNTER — Encounter: Payer: Self-pay | Admitting: Genetic Counselor

## 2022-07-26 ENCOUNTER — Telehealth: Payer: Self-pay | Admitting: Genetic Counselor

## 2022-07-26 DIAGNOSIS — Z1379 Encounter for other screening for genetic and chromosomal anomalies: Secondary | ICD-10-CM | POA: Insufficient documentation

## 2022-07-26 NOTE — Telephone Encounter (Signed)
I contacted Ms. Viele to discuss her genetic testing results. No pathogenic variants were identified in the 71 genes analyzed. Detailed clinic note to follow.  The test report has been scanned into EPIC and is located under the Molecular Pathology section of the Results Review tab.  A portion of the result report is included below for reference.   Lalla Brothers, MS, [facility] Genetic Counselor [email or web address] (P) [phone]

## 2022-08-02 ENCOUNTER — Encounter: Payer: Self-pay | Admitting: Genetic Counselor

## 2022-08-02 ENCOUNTER — Ambulatory Visit: Payer: Self-pay | Admitting: Genetic Counselor

## 2022-08-02 DIAGNOSIS — Z1379 Encounter for other screening for genetic and chromosomal anomalies: Secondary | ICD-10-CM

## 2022-08-02 NOTE — Progress Notes (Signed)
HPI:   [name] was previously seen in the [facility] due to a family history of cancer and concerns regarding a hereditary predisposition to cancer. Please refer to our prior cancer genetics clinic note for more information regarding our discussion, assessment and recommendations, at the time. [name] recent genetic test results were disclosed to her, as were recommendations warranted by these results. These results and recommendations are discussed in more detail below.  CANCER HISTORY:  Oncology History   No history exists.    FAMILY HISTORY:  We obtained a detailed, 4-generation family history.  Significant diagnoses are listed below:      Family History  Problem Relation Age of Onset   Acute lymphoblastic leukemia Mother 64   Aneurysm Father     Chelsea Landry Father     Lung cancer Sister 54        non-smoker   Brain cancer Brother 88   Brain cancer Maternal Aunt     Breast cancer Paternal Aunt          dx.>50   Hypertension Other         [name] is unaware of previous family history of genetic testing for hereditary cancer risks. There is no reported Ashkenazi Jewish ancestry.   GENETIC TEST RESULTS:  The [facility] CancerNext-Expanded Panel found no pathogenic mutations.  The CancerNext-Expanded gene panel offered by [facility] and includes sequencing, rearrangement, and RNA analysis for the following 71 genes: AIP, ALK, APC, ATM, AXIN2, BAP1, BARD1, BMPR1A, BRCA1, BRCA2, BRIP1, CDC73, CDH1, CDK4, CDKN1B, CDKN2A, CHEK2, CTNNA1, DICER1, FH, FLCN, KIF1B, LZTR1, MAX, MEN1, MET, MLH1, MSH2, MSH3, MSH6, MUTYH, NF1, NF2, NTHL1, PALB2, PHOX2B, PMS2, POT1, PRKAR1A, PTCH1, PTEN, RAD51C, RAD51D, RB1, RET, SDHA, SDHAF2, SDHB, SDHC, SDHD, SMAD4, SMARCA4, SMARCB1, SMARCE1, STK11, SUFU, TMEM127, TP53, TSC1, TSC2, and VHL (sequencing and deletion/duplication); EGFR, EGLN1, HOXB13, KIT, MITF, PDGFRA, POLD1, and POLE (sequencing only); EPCAM and GREM1  (deletion/duplication only).   The test report has been scanned into EPIC and is located under the Molecular Pathology section of the Results Review tab.  A portion of the result report is included below for reference. Genetic testing reported out on 07/21/2022.        Even though a pathogenic variant was not identified, possible explanations for the cancer in the family may include: There may be no hereditary risk for cancer in the family. The cancers in her family may be due to other genetic or environmental factors. There may be a gene mutation in one of these genes that current testing methods cannot detect, but that chance is small. There could be another gene that has not yet been discovered, or that we have not yet tested, that is responsible for the cancer diagnoses in the family.  It is also possible there is a hereditary cause for the cancer in the family that [name] did not inherit.  Therefore, it is important to remain in touch with cancer genetics in the future so that we can continue to offer [name] the most up to date genetic testing.   ADDITIONAL GENETIC TESTING:  We discussed with [name] that her genetic testing was fairly extensive.  If there are genes identified to increase cancer risk that can be analyzed in the future, we would be happy to discuss and coordinate this testing at that time.    CANCER SCREENING RECOMMENDATIONS:  [name] test result is considered negative (normal). This means that we have not identified a  hereditary cause for her family history of cancer at this time.    An individual's cancer risk and medical management are not determined by genetic test results alone. Overall cancer risk assessment incorporates additional factors, including personal medical history, family history, and any available genetic information that may result in a personalized plan for cancer prevention and surveillance. Therefore, it is recommended she continue to  follow the cancer management and screening guidelines provided by her primary healthcare provider.  RECOMMENDATIONS FOR FAMILY MEMBERS:   Since she did not inherit a mutation in a cancer predisposition gene included on this panel, her children could not have inherited a mutation from her in one of these genes.  FOLLOW-UP:  Cancer genetics is a rapidly advancing field and it is possible that new genetic tests will be appropriate for her and/or her family members in the future. We encouraged her to remain in contact with cancer genetics on an annual basis so we can update her personal and family histories and let her know of advances in cancer genetics that may benefit this family.   Our contact number was provided. [name] questions were answered to her satisfaction, and she knows she is welcome to call us at anytime with additional questions or concerns.   Lalla Brothers, MS, [facility] Genetic Counselor [email or web address] (P) [phone]

## 2022-08-03 ENCOUNTER — Telehealth: Payer: Self-pay

## 2022-08-03 NOTE — Telephone Encounter (Signed)
I RECEIVED PT APPLICATION,INCOME, PROVIDER PAGES  AND HAS Submitted application for OZEMPIC to [employer] for patient assistance.   Phone: [phone]  PLEASE BE ADVISED    Melanee Spry CPhT Rx Patient Advocate [phone]

## 2022-08-11 ENCOUNTER — Ambulatory Visit (HOSPITAL_BASED_OUTPATIENT_CLINIC_OR_DEPARTMENT_OTHER)
Admission: RE | Admit: 2022-08-11 | Discharge: 2022-08-11 | Disposition: A | Discharge status: Home / Self Care | Payer: Medicare HMO | Source: Ambulatory Visit | Attending: Internal Medicine | Admitting: Internal Medicine

## 2022-08-11 DIAGNOSIS — E7800 Pure hypercholesterolemia, unspecified: Secondary | ICD-10-CM | POA: Insufficient documentation

## 2022-08-11 NOTE — Telephone Encounter (Signed)
Received notification from [employer] regarding approval for OZEMPIC. Patient assistance approved from 08/11/2022 to [id number].  Phone: [phone] [employer] pt ID [id number] Please be advised I have called pt and verified dose and spoke to company for correction.   Georga Bora Rx Patient Advocate [phone]

## 2022-08-18 ENCOUNTER — Other Ambulatory Visit: Payer: Self-pay

## 2022-08-18 DIAGNOSIS — E7800 Pure hypercholesterolemia, unspecified: Secondary | ICD-10-CM

## 2022-08-18 MED ORDER — ATORVASTATIN CALCIUM 20 MG PO TABS
ORAL_TABLET | ORAL | 1 refills | Status: DC
Start: 2022-08-18 — End: 2022-10-05

## 2022-08-24 ENCOUNTER — Encounter: Payer: Self-pay | Admitting: Internal Medicine

## 2022-08-24 ENCOUNTER — Ambulatory Visit (INDEPENDENT_AMBULATORY_CARE_PROVIDER_SITE_OTHER): Payer: Medicare HMO | Admitting: Internal Medicine

## 2022-08-24 VITALS — BP 110/78 | HR 77 | Temp 98.4°F | Ht 61.0 in | Wt 152.2 lb

## 2022-08-24 DIAGNOSIS — E1169 Type 2 diabetes mellitus with other specified complication: Secondary | ICD-10-CM | POA: Insufficient documentation

## 2022-08-24 DIAGNOSIS — Z1159 Encounter for screening for other viral diseases: Secondary | ICD-10-CM

## 2022-08-24 DIAGNOSIS — I2510 Atherosclerotic heart disease of native coronary artery without angina pectoris: Secondary | ICD-10-CM | POA: Diagnosis not present

## 2022-08-24 DIAGNOSIS — I119 Hypertensive heart disease without heart failure: Secondary | ICD-10-CM

## 2022-08-24 DIAGNOSIS — G4733 Obstructive sleep apnea (adult) (pediatric): Secondary | ICD-10-CM | POA: Insufficient documentation

## 2022-08-24 DIAGNOSIS — E7800 Pure hypercholesterolemia, unspecified: Secondary | ICD-10-CM | POA: Diagnosis not present

## 2022-08-24 DIAGNOSIS — E1165 Type 2 diabetes mellitus with hyperglycemia: Secondary | ICD-10-CM

## 2022-08-24 DIAGNOSIS — D1771 Benign lipomatous neoplasm of kidney: Secondary | ICD-10-CM

## 2022-08-24 DIAGNOSIS — Z860100 Personal history of colon polyps, unspecified: Secondary | ICD-10-CM

## 2022-08-24 NOTE — Assessment & Plan Note (Signed)
Chronic, diagnosed in January 2023.  She is currently taking Ozempic 0.5mg  weekly without any issues.  I will check labs as below. She is encouraged to decrease her intake of artificially sweetened beverages. She will rto in four months for re-evaluation.

## 2022-08-24 NOTE — Assessment & Plan Note (Signed)
Diagnosed in 2021, sleep study results reviewed in full detail during her visit. Encouraged to consider CPAP in the near future.

## 2022-08-24 NOTE — Assessment & Plan Note (Signed)
Chronic, well controlled. She will c/w losartan 50mg  daily and metoprolol XL 25mg  daily. Encouraged to follow low sodium diet, f/u 4-6 months.

## 2022-08-24 NOTE — Patient Instructions (Addendum)
Dr. Algis Downs,  [place] MD  Hypertension, Adult Hypertension is another name for high blood pressure. High blood pressure forces your heart to work harder to pump blood. This can cause problems over time. There are two numbers in a blood pressure reading. There is a top number (systolic) over a bottom number (diastolic). It is best to have a blood pressure that is below 120/80. What are the causes? The cause of this condition is not known. Some other conditions can lead to high blood pressure. What increases the risk? Some lifestyle factors can make you more likely to develop high blood pressure: Smoking. Not getting enough exercise or physical activity. Being overweight. Having too much fat, sugar, calories, or salt (sodium) in your diet. Drinking too much alcohol. Other risk factors include: Having any of these conditions: Heart disease. Diabetes. High cholesterol. Kidney disease. Obstructive sleep apnea. Having a family history of high blood pressure and high cholesterol. Age. The risk increases with age. Stress. What are the signs or symptoms? High blood pressure may not cause symptoms. Very high blood pressure (hypertensive crisis) may cause: Headache. Fast or uneven heartbeats (palpitations). Shortness of breath. Nosebleed. Vomiting or feeling like you may vomit (nauseous). Changes in how you see. Very bad chest pain. Feeling dizzy. Seizures. How is this treated? This condition is treated by making healthy lifestyle changes, such as: Eating healthy foods. Exercising more. Drinking less alcohol. Your doctor may prescribe medicine if lifestyle changes do not help enough and if: Your top number is above 130. Your bottom number is above 80. Your personal target blood pressure may vary. Follow these instructions at home: Eating and drinking  If told, follow the DASH eating plan. To follow this plan: Fill one half of your plate at each meal with fruits and  vegetables. Fill one fourth of your plate at each meal with whole grains. Whole grains include whole-wheat pasta, brown rice, and whole-grain bread. Eat or drink low-fat dairy products, such as skim milk or low-fat yogurt. Fill one fourth of your plate at each meal with low-fat (lean) proteins. Low-fat proteins include fish, chicken without skin, eggs, beans, and tofu. Avoid fatty meat, cured and processed meat, or chicken with skin. Avoid pre-made or processed food. Limit the amount of salt in your diet to less than 1,500 mg each day. Do not drink alcohol if: Your doctor tells you not to drink. You are pregnant, may be pregnant, or are planning to become pregnant. If you drink alcohol: Limit how much you have to: 0-1 drink a day for women. 0-2 drinks a day for men. Know how much alcohol is in your drink. In the [place] one drink equals one 12 oz bottle of beer (355 mL), one 5 oz glass of wine (148 mL), or one 1 oz glass of hard liquor (44 mL). Lifestyle  Work with your doctor to stay at a healthy weight or to lose weight. Ask your doctor what the best weight is for you. Get at least 30 minutes of exercise that causes your heart to beat faster (aerobic exercise) most days of the week. This may include walking, swimming, or biking. Get at least 30 minutes of exercise that strengthens your muscles (resistance exercise) at least 3 days a week. This may include lifting weights or doing Pilates. Do not smoke or use any products that contain nicotine or tobacco. If you need help quitting, ask your doctor. Check your blood pressure at home as told by your doctor. Keep all  follow-up visits. Medicines Take over-the-counter and prescription medicines only as told by your doctor. Follow directions carefully. Do not skip doses of blood pressure medicine. The medicine does not work as well if you skip doses. Skipping doses also puts you at risk for problems. Ask your doctor about side effects or  reactions to medicines that you should watch for. Contact a doctor if: You think you are having a reaction to the medicine you are taking. You have headaches that keep coming back. You feel dizzy. You have swelling in your ankles. You have trouble with your vision. Get help right away if: You get a very bad headache. You start to feel mixed up (confused). You feel weak or numb. You feel faint. You have very bad pain in your: Chest. Belly (abdomen). You vomit more than once. You have trouble breathing. These symptoms may be an emergency. Get help right away. Call 911. Do not wait to see if the symptoms will go away. Do not drive yourself to the hospital. Summary Hypertension is another name for high blood pressure. High blood pressure forces your heart to work harder to pump blood. For most people, a normal blood pressure is less than 120/80. Making healthy choices can help lower blood pressure. If your blood pressure does not get lower with healthy choices, you may need to take medicine. This information is not intended to replace advice given to you by your health care provider. Make sure you discuss any questions you have with your health care provider. Document Revised: 10/30/2020 Document Reviewed: 10/30/2020 Elsevier Patient Education  2024 [employer].

## 2022-08-24 NOTE — Progress Notes (Signed)
I,Victoria T Deloria Lair, CMA,acting as a Neurosurgeon for [name], MD.,have documented all relevant documentation on the behalf of [name], MD,as directed by  [name], MD while in the presence of [name], MD.  Subjective:  Patient ID: Chelsea Landry , female    DOB: 1955/06/20 , 67 y.o.   MRN: [id number]  Chief Complaint  Patient presents with   Hypertension   Diabetes   Hyperlipidemia    HPI  Patient presents today for bp & dm follow up. She reports compliance with medications. Denies chest pain, palpitations and SOB.  She admits experiencing a " nagging" headache for the past few days. She reports feeling it most when she wakes up.  She would also like to know if her statin medication long term or just to get her cholesterol under control.  Also if she is to see urologist due to fatty tumors on her kidneys.      Past Medical History:  Diagnosis Date   Fibroids    Frequency of urination    Hypertension    Rash    RT LEG   SVD (spontaneous vaginal delivery)    x 2   Vaginal lesion      Family History  Problem Relation Age of Onset   Acute lymphoblastic leukemia Mother 73   Aneurysm Father    Anuerysm Father    Lung cancer Sister 75       non-smoker   Brain cancer Brother 64   Brain cancer Maternal Aunt    Breast cancer Paternal Aunt        dx.>50   Hypertension Other      Current Outpatient Medications:    atorvastatin (LIPITOR) 20 MG tablet, TAKE ONCE DAILY. MON-SAT SKIP SUNDAYS., Disp: 30 tablet, Rfl: 1   KRILL OIL PO, Take 1 capsule by mouth daily., Disp: , Rfl:    losartan (COZAAR) 50 MG tablet, Take 50 mg by mouth daily., Disp: , Rfl: 12   metoprolol succinate (TOPROL-XL) 25 MG 24 hr tablet, Take 1 tablet (25 mg total) by mouth at bedtime., Disp: 30 tablet, Rfl: 3   Multiple Vitamin (MULTIVITAMIN WITH MINERALS) TABS tablet, Take 1 tablet by mouth daily., Disp: , Rfl:    Semaglutide,0.25 or 0.5MG /DOS, (OZEMPIC, 0.25 OR 0.5 MG/DOSE,) 2  MG/3ML SOPN, INJECT 0.50 MG INTO THE SKIN WEEKLY, Disp: 9 mL, Rfl: 1   amoxicillin-clavulanate (AUGMENTIN) 875-125 MG tablet, Take 1 tablet by mouth 2 (two) times daily. (Patient not taking: Reported on 08/24/2022), Disp: 20 tablet, Rfl: 0   VITAMIN D PO, Take by mouth. (Patient not taking: Reported on 06/30/2022), Disp: , Rfl:    No Known Allergies   Review of Systems  Constitutional: Negative.   Respiratory: Negative.    Cardiovascular: Negative.   Gastrointestinal: Negative.   Neurological:  Positive for headaches.       She admits experiencing a " nagging" headache for the past few days. She reports feeling it most when she wakes up. She is not sure what could be triggering her sx. She does report h/o snoring. Has been tested for OSA in the past (2021). HST was POS for OSA. It was suggested that she start CPAP, but she declined.   Psychiatric/Behavioral: Negative.       Today's Vitals   08/24/22 1517  BP: 110/78  Pulse: 77  Temp: 98.4 F (36.9 C)  SpO2: 98%  Weight: 152 lb 3.2 oz (69 kg)  Height: 5\' 1"  (1.549  m)   Body mass index is 28.76 kg/m.  Wt Readings from Last 3 Encounters:  08/24/22 152 lb 3.2 oz (69 kg)  06/30/22 158 lb 9.6 oz (71.9 kg)  05/18/22 161 lb 6.4 oz (73.2 kg)     Objective:  Physical Exam Vitals and nursing note reviewed.  Constitutional:      Appearance: Normal appearance.  HENT:     Head: Normocephalic and atraumatic.  Eyes:     Extraocular Movements: Extraocular movements intact.  Cardiovascular:     Rate and Rhythm: Normal rate and regular rhythm.     Heart sounds: Normal heart sounds.  Pulmonary:     Effort: Pulmonary effort is normal.     Breath sounds: Normal breath sounds.  Musculoskeletal:     Cervical back: Normal range of motion.  Skin:    General: Skin is warm.  Neurological:     General: No focal deficit present.     Mental Status: She is alert.  Psychiatric:        Mood and Affect: Mood normal.        Behavior: Behavior  normal.         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 She will c/w losartan 50mg  daily and metoprolol XL 25mg  daily. Encouraged to follow low sodium diet, f/u 4-6 months.    Coronary artery calcification seen on CAT scan Assessment & Plan: Coronary calcium score of 31.7. This was 70th percentile for age-, race-, and sex-matched controls. She agrees to Cardiology evaluation for further risk assessment. Currently, she is asymptomatic - but has untreated OSA.   Orders: -     Ambulatory referral to Cardiology  Type 2 diabetes mellitus with hyperglycemia, without long-term current use of insulin [facility]) Assessment & Plan: Chronic, diagnosed in January 2023.  She is currently taking Ozempic 0.5mg  weekly without any issues.  I will check labs as below. She is encouraged to decrease her intake of artificially sweetened beverages. She will rto in four months for re-evaluation.   Orders: -     Hemoglobin A1c -     BMP8+EGFR  Pure hypercholesterolemia Assessment & Plan: LDL goal < 70.  She recently started statin therapy after CT coronary revealed calcium score 31 in LAD.  She will rto in 8 weeks to recheck her lipid panel. We discussed need for Cardiology evaluation to further assess cardiac risk. She agrees to referral to Dr. Duke Salvia.    Orders: -     Lipid panel; Future -     ALT; Future  OSA (obstructive sleep apnea) Assessment & Plan: Diagnosed in 2021, sleep study results reviewed in full detail during her visit. Encouraged to consider CPAP in the near future.    Encounter for HCV screening test for low risk patient -     Hepatitis C antibody  Personal history of colonic polyps Assessment & Plan: She has been seen by Deboraha Sprang, Dr. Bosie Clos in the past. I will request copy of her previous colonoscopy report. I will also refer her for CRC screening.   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 I will refer her to Urology for further evaluation. She was previously followed by General Surgery.   Orders: -     Ambulatory referral to Urology     Return in about 4 months (around 12/25/2022), or dm check.  Patient was given  opportunity to ask questions. Patient verbalized understanding of the plan and was able to repeat key elements of the plan. All questions were answered to their satisfaction.    I, [name], MD, have reviewed all documentation for this visit. The documentation on 08/24/22 for the exam, diagnosis, procedures, and orders are all accurate and complete.   IF YOU HAVE BEEN REFERRED TO A SPECIALIST, IT MAY TAKE 1-2 WEEKS TO SCHEDULE/PROCESS THE REFERRAL. IF YOU HAVE NOT HEARD FROM US/SPECIALIST IN TWO WEEKS, PLEASE GIVE [place] A CALL AT [phone] X [phone].   THE PATIENT IS ENCOURAGED TO PRACTICE SOCIAL DISTANCING DUE TO THE COVID-19 PANDEMIC.

## 2022-08-24 NOTE — Assessment & Plan Note (Signed)
LDL goal < 70.  She recently started statin therapy after CT coronary revealed calcium score 31 in LAD.  She will rto in 8 weeks to recheck her lipid panel. We discussed need for Cardiology evaluation to further assess cardiac risk. She agrees to referral to Dr. Duke Salvia.

## 2022-08-24 NOTE — Assessment & Plan Note (Signed)
Chronic, previous CT/MRIs reviewed. I will refer her to Urology for further evaluation. She was previously followed by General Surgery.

## 2022-08-24 NOTE — Assessment & Plan Note (Signed)
Coronary calcium score of 31.7. This was 70th percentile for age-, race-, and sex-matched controls. She agrees to Cardiology evaluation for further risk assessment. Currently, she is asymptomatic - but has untreated OSA.

## 2022-08-24 NOTE — Assessment & Plan Note (Signed)
She has been seen by Deboraha Sprang, Dr. Bosie Clos in the past. I will request copy of her previous colonoscopy report. I will also refer her for CRC screening.

## 2022-09-23 ENCOUNTER — Telehealth: Payer: Self-pay | Admitting: Internal Medicine

## 2022-09-23 NOTE — Telephone Encounter (Signed)
OZEMPIC 1X3ML QUANTITY PICK UP 09/14/22

## 2022-10-05 ENCOUNTER — Other Ambulatory Visit: Payer: Self-pay | Admitting: Internal Medicine

## 2022-10-05 DIAGNOSIS — E7800 Pure hypercholesterolemia, unspecified: Secondary | ICD-10-CM

## 2022-10-12 ENCOUNTER — Other Ambulatory Visit: Payer: Medicare HMO

## 2022-10-12 DIAGNOSIS — E7800 Pure hypercholesterolemia, unspecified: Secondary | ICD-10-CM | POA: Diagnosis not present

## 2022-10-12 DIAGNOSIS — E559 Vitamin D deficiency, unspecified: Secondary | ICD-10-CM | POA: Diagnosis not present

## 2022-10-12 LAB — ALT: ALT: 24 IU/L (ref 0–32)

## 2022-10-12 LAB — LIPID PANEL
Chol/HDL Ratio: 2.4 ratio (ref 0.0–4.4)
Cholesterol, Total: 127 mg/dL (ref 100–199)
HDL: 53 mg/dL (ref >39)
LDL Chol Calc (NIH): 62 mg/dL (ref 0–99)
Triglycerides: 56 mg/dL (ref 0–149)
VLDL Cholesterol Cal: 12 mg/dL (ref 5–40)

## 2022-10-12 NOTE — Patient Instructions (Signed)

## 2022-10-13 ENCOUNTER — Ambulatory Visit (INDEPENDENT_AMBULATORY_CARE_PROVIDER_SITE_OTHER): Payer: Medicare HMO | Admitting: Internal Medicine

## 2022-10-13 ENCOUNTER — Other Ambulatory Visit: Payer: Self-pay

## 2022-10-13 ENCOUNTER — Encounter: Payer: Self-pay | Admitting: Internal Medicine

## 2022-10-13 VITALS — BP 110/78 | HR 78 | Temp 98.3°F | Ht 61.0 in | Wt 152.6 lb

## 2022-10-13 DIAGNOSIS — Z23 Encounter for immunization: Secondary | ICD-10-CM

## 2022-10-13 DIAGNOSIS — I119 Hypertensive heart disease without heart failure: Secondary | ICD-10-CM

## 2022-10-13 DIAGNOSIS — E1169 Type 2 diabetes mellitus with other specified complication: Secondary | ICD-10-CM | POA: Diagnosis not present

## 2022-10-13 DIAGNOSIS — E785 Hyperlipidemia, unspecified: Secondary | ICD-10-CM

## 2022-10-13 DIAGNOSIS — E2839 Other primary ovarian failure: Secondary | ICD-10-CM

## 2022-10-13 DIAGNOSIS — D1771 Benign lipomatous neoplasm of kidney: Secondary | ICD-10-CM

## 2022-10-13 DIAGNOSIS — E559 Vitamin D deficiency, unspecified: Secondary | ICD-10-CM | POA: Diagnosis not present

## 2022-10-13 DIAGNOSIS — E7800 Pure hypercholesterolemia, unspecified: Secondary | ICD-10-CM

## 2022-10-13 DIAGNOSIS — E1165 Type 2 diabetes mellitus with hyperglycemia: Secondary | ICD-10-CM

## 2022-10-13 MED ORDER — NOVOFINE PEN NEEDLE 32G X 6 MM MISC: 1 refills | Status: AC

## 2022-10-13 NOTE — Progress Notes (Signed)
I,Skyrah Krupp T Deloria Lair, CMA,acting as a Neurosurgeon for [place], CMA.,have documented all relevant documentation on the behalf of [place]ze, [place] directed by  [place]idge Breeze, CMA while in the presence of [place] Breeze, [place].  Subjective:  Patient ID: Chelsea Landry , female    DOB: 31-Mar-1955 , 67 y.o.   MRN: [id number]  No chief complaint on file.   HPI  HPI   Past Medical History:  Diagnosis Date   Fibroids    Frequency of urination    Hypertension    Rash    RT LEG   SVD (spontaneous vaginal delivery)    x 2   Vaginal lesion      Family History  Problem Relation Age of Onset   Acute lymphoblastic leukemia Mother 44   Aneurysm Father    Anuerysm Father    Lung cancer Sister 3       non-smoker   Brain cancer Brother 33   Brain cancer Maternal Aunt    Breast cancer Paternal Aunt        dx.>50   Hypertension Other      Current Outpatient Medications:    amoxicillin-clavulanate (AUGMENTIN) 875-125 MG tablet, Take 1 tablet by mouth 2 (two) times daily. (Patient not taking: Reported on 08/24/2022), Disp: 20 tablet, Rfl: 0   atorvastatin (LIPITOR) 20 MG tablet, TAKE ONCE DAILY. MON-SAT SKIP SUNDAYS., Disp: 30 tablet, Rfl: 1   KRILL OIL PO, Take 1 capsule by mouth daily., Disp: , Rfl:    losartan (COZAAR) 50 MG tablet, Take 50 mg by mouth daily., Disp: , Rfl: 12   metoprolol succinate (TOPROL-XL) 25 MG 24 hr tablet, Take 1 tablet (25 mg total) by mouth at bedtime., Disp: 30 tablet, Rfl: 3   Multiple Vitamin (MULTIVITAMIN WITH MINERALS) TABS tablet, Take 1 tablet by mouth daily., Disp: , Rfl:    Semaglutide,0.25 or 0.5MG /DOS, (OZEMPIC, 0.25 OR 0.5 MG/DOSE,) 2 MG/3ML SOPN, INJECT 0.50 MG INTO THE SKIN WEEKLY, Disp: 9 mL, Rfl: 1   VITAMIN D PO, Take by mouth. (Patient not taking: Reported on 06/30/2022), Disp: , Rfl:    No Known Allergies   Review of Systems   There were no vitals filed for this visit. There is no height or weight on file to  calculate BMI.  Wt Readings from Last 3 Encounters:  10/13/22 152 lb 9.6 oz (69.2 kg)  08/24/22 152 lb 3.2 oz (69 kg)  06/30/22 158 lb 9.6 oz (71.9 kg)     Objective:  Physical Exam      Assessment And Plan:  There are no diagnoses linked to this encounter.   No follow-ups on file.  Patient was given opportunity to ask questions. Patient verbalized understanding of the plan and was able to repeat key elements of the plan. All questions were answered to their satisfaction.  [place]ree[place], CMA  I, [place], CMA, have reviewed all documentation for this visit. The documentation on 10/13/22 for the exam, diagnosis, procedures, and orders are all accurate and complete.   IF YOU HAVE BEEN REFERRED TO A SPECIALIST, IT MAY TAKE 1-2 WEEKS TO SCHEDULE/PROCESS THE REFERRAL. IF YOU HAVE NOT HEARD FROM US/SPECIALIST IN TWO WEEKS, PLEASE GIVE [place] A CALL AT [phone] X [phone].   THE PATIENT IS ENCOURAGED TO PRACTICE SOCIAL DISTANCING DUE TO THE COVID-19 PANDEMIC.

## 2022-10-13 NOTE — Progress Notes (Unsigned)
I,Victoria T Deloria Lair, CMA,acting as a Neurosurgeon for [name], MD.,have documented all relevant documentation on the behalf of [name], MD,as directed by  [name], MD while in the presence of [name], MD.  Subjective:  Patient ID: Chelsea Landry , female    DOB: 1955-04-12 , 67 y.o.   MRN: [id number]  Chief Complaint  Patient presents with  . Hyperlipidemia    HPI  Patient presents today for cholesterol follow up. She reports compliance with medications. Denies chest pain, palpitations and SOB.  She questions how long can she use the 0.5 of 1mg  from the ozempic pen. Previously the pen was marked for her to use. She is concerned due to how long the medicine is lasting. She has the 1mg  pen because [facility] sent her the incorrect dose.   She adds, recently visiting GYN. Her gyn requested to get her Vit D checked today.      Past Medical History:  Diagnosis Date  . Fibroids   . Frequency of urination   . Hypertension   . Rash    RT LEG  . SVD (spontaneous vaginal delivery)    x 2  . Vaginal lesion      Family History  Problem Relation Age of Onset  . Acute lymphoblastic leukemia Mother 47  . Aneurysm Father   . Anuerysm Father   . Lung cancer Sister 92       non-smoker  . Brain cancer Brother 40  . Brain cancer Maternal Aunt   . Breast cancer Paternal Aunt        dx.>50  . Hypertension Other      Current Outpatient Medications:  .  atorvastatin (LIPITOR) 20 MG tablet, TAKE ONCE DAILY. MON-SAT SKIP SUNDAYS., Disp: 30 tablet, Rfl: 1 .  KRILL OIL PO, Take 1 capsule by mouth daily., Disp: , Rfl:  .  losartan (COZAAR) 50 MG tablet, Take 50 mg by mouth daily., Disp: , Rfl: 12 .  metoprolol succinate (TOPROL-XL) 25 MG 24 hr tablet, Take 1 tablet (25 mg total) by mouth at bedtime., Disp: 30 tablet, Rfl: 3 .  Multiple Vitamin (MULTIVITAMIN WITH MINERALS) TABS tablet, Take 1 tablet by mouth daily., Disp: , Rfl:  .  Semaglutide,0.25 or 0.5MG /DOS, (OZEMPIC,  0.25 OR 0.5 MG/DOSE,) 2 MG/3ML SOPN, INJECT 0.50 MG INTO THE SKIN WEEKLY, Disp: 9 mL, Rfl: 1 .  amoxicillin-clavulanate (AUGMENTIN) 875-125 MG tablet, Take 1 tablet by mouth 2 (two) times daily. (Patient not taking: Reported on 08/24/2022), Disp: 20 tablet, Rfl: 0 .  VITAMIN D PO, Take by mouth. (Patient not taking: Reported on 06/30/2022), Disp: , Rfl:    No Known Allergies   Review of Systems  Constitutional: Negative.   Respiratory: Negative.    Cardiovascular: Negative.   Neurological: Negative.   Psychiatric/Behavioral: Negative.       Today's Vitals   10/13/22 1447  BP: 110/78  Pulse: 78  Temp: 98.3 F (36.8 C)  SpO2: 98%  Weight: 152 lb 9.6 oz (69.2 kg)  Height: 5\' 1"  (1.549 m)   Body mass index is 28.83 kg/m.  Wt Readings from Last 3 Encounters:  10/13/22 152 lb 9.6 oz (69.2 kg)  08/24/22 152 lb 3.2 oz (69 kg)  06/30/22 158 lb 9.6 oz (71.9 kg)     Objective:  Physical Exam      Assessment And Plan:  Pure hypercholesterolemia  Angiomyolipoma of kidney  Immunization due -     Flu Vaccine Trivalent  High Dose (Fluad)     Return if symptoms worsen or fail to improve.  Patient was given opportunity to ask questions. Patient verbalized understanding of the plan and was able to repeat key elements of the plan. All questions were answered to their satisfaction.  [name], MD  I, [name], MD, have reviewed all documentation for this visit. The documentation on 10/13/22 for the exam, diagnosis, procedures, and orders are all accurate and complete.   IF YOU HAVE BEEN REFERRED TO A SPECIALIST, IT MAY TAKE 1-2 WEEKS TO SCHEDULE/PROCESS THE REFERRAL. IF YOU HAVE NOT HEARD FROM US/SPECIALIST IN TWO WEEKS, PLEASE GIVE [place] A CALL AT [phone].   THE PATIENT IS ENCOURAGED TO PRACTICE SOCIAL DISTANCING DUE TO THE COVID-19 PANDEMIC.

## 2022-10-15 ENCOUNTER — Other Ambulatory Visit: Payer: Self-pay | Admitting: Internal Medicine

## 2022-10-15 LAB — SPECIMEN STATUS REPORT

## 2022-10-15 LAB — VITAMIN D 25 HYDROXY (VIT D DEFICIENCY, FRACTURES): Vit D, 25-Hydroxy: 43.8 ng/mL (ref 30.0–100.0)

## 2022-10-23 MED ORDER — ATORVASTATIN CALCIUM 20 MG PO TABS
ORAL_TABLET | ORAL | 2 refills | Status: DC
Start: 2022-10-23 — End: 2023-01-25

## 2022-10-23 NOTE — Assessment & Plan Note (Signed)
She was previously followed by General Surgery, however, has not had a recent evaluation. I will refer her to Urology for f/u. She would like confirmation that this ias a benign condition. Previous MRI results reviewed.

## 2022-10-24 NOTE — Assessment & Plan Note (Addendum)
Chronic, LDL goal is less than 70.  Labs drawn yesterday were reviewed in detail. She is now at goal. She will continue with atorvastatin 20mg  M-Saturdays, skipping Sundays. Regarding Ozempic, each of her pens were marked at 36 clicks, equivalent to 0.5mg  dosage.

## 2022-10-24 NOTE — Assessment & Plan Note (Signed)
 I will check vitamin D level and supplement as needed.

## 2022-11-15 DIAGNOSIS — Z23 Encounter for immunization: Secondary | ICD-10-CM | POA: Diagnosis not present

## 2022-12-01 ENCOUNTER — Telehealth: Payer: Self-pay | Admitting: Pharmacist

## 2022-12-01 NOTE — Telephone Encounter (Signed)
Assisting patient with re-enrollment for Ozempic 0.5 mg weekly for 2025. Patient portion completed for her to sign at upcoming PCP appointment.   Contacted patient to notify. She verbalized understanding.   Catie Eppie Gibson, PharmD, BCACP, CPP Clinical Pharmacist [facility] [phone]

## 2022-12-06 ENCOUNTER — Encounter: Payer: Self-pay | Admitting: Internal Medicine

## 2022-12-06 ENCOUNTER — Ambulatory Visit (INDEPENDENT_AMBULATORY_CARE_PROVIDER_SITE_OTHER): Payer: Medicare HMO | Admitting: Internal Medicine

## 2022-12-06 VITALS — BP 110/76 | HR 78 | Temp 98.0°F | Ht 61.0 in | Wt 149.4 lb

## 2022-12-06 DIAGNOSIS — E785 Hyperlipidemia, unspecified: Secondary | ICD-10-CM

## 2022-12-06 DIAGNOSIS — Z6828 Body mass index (BMI) 28.0-28.9, adult: Secondary | ICD-10-CM | POA: Insufficient documentation

## 2022-12-06 DIAGNOSIS — E663 Overweight: Secondary | ICD-10-CM | POA: Insufficient documentation

## 2022-12-06 DIAGNOSIS — D1771 Benign lipomatous neoplasm of kidney: Secondary | ICD-10-CM | POA: Diagnosis not present

## 2022-12-06 DIAGNOSIS — E1169 Type 2 diabetes mellitus with other specified complication: Secondary | ICD-10-CM

## 2022-12-06 DIAGNOSIS — I119 Hypertensive heart disease without heart failure: Secondary | ICD-10-CM

## 2022-12-06 NOTE — Assessment & Plan Note (Signed)
Chronic, well controlled. She will continue with losartan and metoprolol.  Encouraged to follow low sodium diet.

## 2022-12-06 NOTE — Assessment & Plan Note (Signed)
Chronic, encouraged to aim for at least 150 minutes of exercise per week.

## 2022-12-06 NOTE — Progress Notes (Signed)
I,Victoria T Deloria Lair, CMA,acting as a Neurosurgeon for [name], MD.,have documented all relevant documentation on the behalf of [name], MD,as directed by  [name], MD while in the presence of [name], MD.  Subjective:  Patient ID: Chelsea Landry , female    DOB: 01-01-1956 , 67 y.o.   MRN: [id number]  Chief Complaint  Patient presents with   Diabetes   Hypertension    HPI  Patient presents today for cholesterol follow up. She is now taking atorvastatin 20mg  daily. She has not had any issues with the medication. She reports compliance with medications. Denies chest pain, palpitations and SOB.  She is exercising on a regular basis.    She denies having any questions or concerns.        Diabetes She presents for her follow-up diabetic visit. She has type 2 diabetes mellitus. Her disease course has been stable. There are no hypoglycemic associated symptoms. There are no diabetic associated symptoms. Pertinent negatives for diabetes include no blurred vision and no chest pain. There are no hypoglycemic complications. Symptoms are stable. There are no diabetic complications. Risk factors for coronary artery disease include diabetes mellitus, dyslipidemia, hypertension and post-menopausal. She is compliant with treatment most of the time.  Hypertension This is a chronic problem. The current episode started more Landry 1 year ago. The problem has been gradually improving since onset. Pertinent negatives include no blurred vision, chest pain, orthopnea, palpitations or shortness of breath. Past treatments include angiotensin blockers and beta blockers. The current treatment provides moderate improvement.     Past Medical History:  Diagnosis Date   Fibroids    Frequency of urination    Hypertension    Rash    RT LEG   SVD (spontaneous vaginal delivery)    x 2   Vaginal lesion      Family History  Problem Relation Age of Onset   Acute lymphoblastic leukemia  Mother 36   Aneurysm Father    Anuerysm Father    Lung cancer Sister 79       non-smoker   Brain cancer Brother 60   Brain cancer Maternal Aunt    Breast cancer Paternal Aunt        dx.>50   Hypertension Other      Current Outpatient Medications:    atorvastatin (LIPITOR) 20 MG tablet, TAKE ONCE DAILY. MON-SAT SKIP SUNDAYS., Disp: 75 tablet, Rfl: 2   Insulin Pen Needle (NOVOFINE PEN NEEDLE) 32G X 6 MM MISC, USE ONCE DAILY AS NEEDED., Disp: 100 each, Rfl: 1   KRILL OIL PO, Take 1 capsule by mouth daily., Disp: , Rfl:    losartan (COZAAR) 50 MG tablet, Take 50 mg by mouth daily., Disp: , Rfl: 12   metoprolol succinate (TOPROL-XL) 25 MG 24 hr tablet, Take 1 tablet (25 mg total) by mouth at bedtime., Disp: 30 tablet, Rfl: 3   Multiple Vitamin (MULTIVITAMIN WITH MINERALS) TABS tablet, Take 1 tablet by mouth daily., Disp: , Rfl:    Semaglutide,0.25 or 0.5MG /DOS, (OZEMPIC, 0.25 OR 0.5 MG/DOSE,) 2 MG/3ML SOPN, INJECT 0.50 MG INTO THE SKIN WEEKLY, Disp: 9 mL, Rfl: 1   amoxicillin-clavulanate (AUGMENTIN) 875-125 MG tablet, Take 1 tablet by mouth 2 (two) times daily. (Patient not taking: Reported on 08/24/2022), Disp: 20 tablet, Rfl: 0   No Known Allergies   Review of Systems  Constitutional: Negative.   Eyes:  Negative for blurred vision.  Respiratory: Negative.  Negative for shortness of breath.  Cardiovascular: Negative.  Negative for chest pain, palpitations and orthopnea.  Gastrointestinal: Negative.   Neurological: Negative.   Psychiatric/Behavioral: Negative.       Today's Vitals   12/06/22 1452  BP: 110/76  Pulse: 78  Temp: 98 F (36.7 C)  SpO2: 98%  Weight: 149 lb 6.4 oz (67.8 kg)  Height: 5\' 1"  (1.549 m)   Body mass index is 28.23 kg/m.  Wt Readings from Last 3 Encounters:  12/06/22 149 lb 6.4 oz (67.8 kg)  10/13/22 152 lb 9.6 oz (69.2 kg)  08/24/22 152 lb 3.2 oz (69 kg)     Objective:  Physical Exam Vitals and nursing note reviewed.  Constitutional:       Appearance: Normal appearance.  HENT:     Head: Normocephalic and atraumatic.  Eyes:     Extraocular Movements: Extraocular movements intact.  Cardiovascular:     Rate and Rhythm: Normal rate and regular rhythm.     Heart sounds: Normal heart sounds.  Pulmonary:     Effort: Pulmonary effort is normal.     Breath sounds: Normal breath sounds.  Musculoskeletal:     Cervical back: Normal range of motion.  Skin:    General: Skin is warm.  Neurological:     General: No focal deficit present.     Mental Status: She is alert.  Psychiatric:        Mood and Affect: Mood normal.        Behavior: Behavior normal.         Assessment And Plan:  Dyslipidemia associated with type 2 diabetes mellitus (HCC) Assessment & Plan: Chronic, LDL goal is less Landry 70. She is now taking atorvastatin 20mg  daily. Sept 2024 A1c less Landry 6.0. No med changes. She will f/lu in 4 months for re-evaluation.   Orders: -     CMP14+EGFR -     Hemoglobin A1c -     TSH  Hypertensive heart disease without heart failure Assessment & Plan: Chronic, well controlled. She will continue with losartan and metoprolol.  Encouraged to follow low sodium diet.    Angiomyolipoma of kidney Assessment & Plan: Chronic, she has upcoming Urology appt. Most recent MR abdomen reviewed.    Overweight with body mass index (BMI) of 28 to 28.9 in adult Assessment & Plan: Chronic, encouraged to aim for at least 150 minutes of exercise per week.        Return if symptoms worsen or fail to improve.  Patient was given opportunity to ask questions. Patient verbalized understanding of the plan and was able to repeat key elements of the plan. All questions were answered to their satisfaction.    I, [name], MD, have reviewed all documentation for this visit. The documentation on 12/06/22 for the exam, diagnosis, procedures, and orders are all accurate and complete.   IF YOU HAVE BEEN REFERRED TO A SPECIALIST, IT MAY  TAKE 1-2 WEEKS TO SCHEDULE/PROCESS THE REFERRAL. IF YOU HAVE NOT HEARD FROM US/SPECIALIST IN TWO WEEKS, PLEASE GIVE [place] A CALL AT [phone].   THE PATIENT IS ENCOURAGED TO PRACTICE SOCIAL DISTANCING DUE TO THE COVID-19 PANDEMIC.

## 2022-12-06 NOTE — Patient Instructions (Signed)

## 2022-12-06 NOTE — Assessment & Plan Note (Addendum)
Chronic, she has upcoming Urology appt. Most recent MR abdomen reviewed.

## 2022-12-06 NOTE — Assessment & Plan Note (Signed)
Chronic, LDL goal is less than 70. She is now taking atorvastatin 20mg  daily. Sept 2024 A1c less than 6.0. No med changes. She will f/lu in 4 months for re-evaluation.

## 2022-12-07 LAB — CMP14+EGFR
ALT: 37 [IU]/L — ABNORMAL HIGH (ref 0–32)
AST: 38 [IU]/L (ref 0–40)
Albumin: 4.3 g/dL (ref 3.9–4.9)
Alkaline Phosphatase: 63 [IU]/L (ref 44–121)
BUN/Creatinine Ratio: 17 (ref 12–28)
BUN: 15 mg/dL (ref 8–27)
Bilirubin Total: 0.5 mg/dL (ref 0.0–1.2)
CO2: 25 mmol/L (ref 20–29)
Calcium: 9.7 mg/dL (ref 8.7–10.3)
Chloride: 104 mmol/L (ref 96–106)
Creatinine, Ser: 0.86 mg/dL (ref 0.57–1.00)
Globulin, Total: 2.1 g/dL (ref 1.5–4.5)
Glucose: 83 mg/dL (ref 70–99)
Potassium: 4.8 mmol/L (ref 3.5–5.2)
Sodium: 141 mmol/L (ref 134–144)
Total Protein: 6.4 g/dL (ref 6.0–8.5)
eGFR: 74 mL/min/{1.73_m2} (ref >59)

## 2022-12-07 LAB — TSH: TSH: 1.97 u[IU]/mL (ref 0.450–4.500)

## 2022-12-07 LAB — HEMOGLOBIN A1C
Est. average glucose Bld gHb Est-mCnc: 123 mg/dL
Hgb A1c MFr Bld: 5.9 % — ABNORMAL HIGH (ref 4.8–5.6)

## 2022-12-08 ENCOUNTER — Other Ambulatory Visit: Payer: Self-pay | Admitting: Pharmacist

## 2022-12-08 NOTE — Progress Notes (Signed)
Pharmacy Medication Assistance Program Note    12/08/2022  Patient ID: Chelsea Landry, female   DOB: 06-22-1955, 67 y.o.   MRN: [id number]     12/08/2022  Outreach Medication One  Initial Outreach Date (Medication One) 12/06/2022  Manufacturer Medication One [employer] [facility] Ozempic  Dose of Ozempic 0.5 mg  Type of Radiographer, therapeutic Assistance  Date Application Sent to Patient 12/06/2022  Application Items Requested Application  Date Application Sent to Prescriber 12/06/2022  Name of Prescriber Dorothyann Peng  Date Application Received From Patient 12/08/2022  Application Items Received From Patient Application  Date Application Received From Provider 12/08/2022  Date Application Submitted to Manufacturer 12/08/2022  Method Application Sent to Manufacturer Fax      Will collaborate with technician team for follow up.   Catie Eppie Gibson, PharmD, BCACP, CPP Clinical Pharmacist [facility] [phone]

## 2022-12-20 ENCOUNTER — Encounter (HOSPITAL_BASED_OUTPATIENT_CLINIC_OR_DEPARTMENT_OTHER): Payer: Self-pay | Admitting: Cardiovascular Disease

## 2022-12-20 ENCOUNTER — Ambulatory Visit (HOSPITAL_BASED_OUTPATIENT_CLINIC_OR_DEPARTMENT_OTHER): Payer: Medicare HMO | Admitting: Cardiovascular Disease

## 2022-12-20 VITALS — BP 112/84 | HR 72 | Ht 62.0 in | Wt 150.4 lb

## 2022-12-20 DIAGNOSIS — E1169 Type 2 diabetes mellitus with other specified complication: Secondary | ICD-10-CM

## 2022-12-20 DIAGNOSIS — Z636 Dependent relative needing care at home: Secondary | ICD-10-CM

## 2022-12-20 DIAGNOSIS — G4733 Obstructive sleep apnea (adult) (pediatric): Secondary | ICD-10-CM

## 2022-12-20 DIAGNOSIS — E785 Hyperlipidemia, unspecified: Secondary | ICD-10-CM | POA: Diagnosis not present

## 2022-12-20 DIAGNOSIS — I10 Essential (primary) hypertension: Secondary | ICD-10-CM | POA: Diagnosis not present

## 2022-12-20 DIAGNOSIS — I2510 Atherosclerotic heart disease of native coronary artery without angina pectoris: Secondary | ICD-10-CM | POA: Diagnosis not present

## 2022-12-20 HISTORY — DX: Dependent relative needing care at home: Z63.6

## 2022-12-20 NOTE — Progress Notes (Signed)
  Cardiology Office Note:  .   Date:  12/20/2022  ID:  Chelsea Landry, DOB 01/25/1956, MRN [id number] PCP: Dorothyann Peng, MD  [facility] Providers Cardiologist:  None    History of Present Illness: .    Chelsea Landry is a 67 y.o. female with hypertension, hyperlipidemia, diabetes who is being seen today for the evaluation of coronary calcification at the request of Dorothyann Peng, MD.  Chelsea Landry had a coronary calcium score 07/2022 that revealed a calcium score 31.7.  This was 70th percentile for age and gender.  She was started on atorvastatin and lipids have been well-controlled.  She today presents today for further restratification.  ROS:  As per HPI  Studies Reviewed: Marland Kitchen       Coronary calcium score 07/2022: IMPRESSION: 1. Coronary calcium score of 31.7. This was 70th percentile for age-, race-, and sex-matched controls.  Risk Assessment/Calculations:             Physical Exam:    VS:  BP 112/84 (BP Location: Left Arm, Patient Position: Sitting, Cuff Size: Normal)   Pulse 72   Ht 5\' 2"  (1.575 m)   Wt 150 lb 6.4 oz (68.2 kg)   SpO2 99%   BMI 27.51 kg/m  , BMI Body mass index is 27.51 kg/m. GENERAL:  Well appearing HEENT: Pupils equal round and reactive, fundi not visualized, oral mucosa unremarkable NECK:  No jugular venous distention, waveform within normal limits, carotid upstroke brisk and symmetric, no bruits, no thyromegaly LUNGS:  Clear to auscultation bilaterally HEART:  RRR.  PMI not displaced or sustained,S1 and S2 within normal limits, no S3, no S4, no clicks, no rubs, no murmurs ABD:  Flat, positive bowel sounds normal in frequency in pitch, no bruits, no rebound, no guarding, no midline pulsatile mass, no hepatomegaly, no splenomegaly EXT:  2 plus pulses throughout, no edema, no cyanosis no clubbing SKIN:  No rashes no nodules NEURO:  Cranial nerves II through XII grossly intact, motor grossly intact throughout PSYCH:  Cognitively intact, oriented  to person place and time   ASSESSMENT AND PLAN: .    # Coronary Artery Disease Asymptomatic with a calcium score indicating presence of plaque. No symptoms of angina with exercise.  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  Family history of brain aneurysm in father but no MI/CVA. -Continue Atorvastatin to maintain LDL under 70. -Encourage plant-based diet and regular exercise. -Annual follow-up unless symptoms develop.  # Hypertension Controlled on Losartan. Patient is on Metoprolol without clear indication. -Discontinue Metoprolol. -Monitor blood pressure at home after stopping Metoprolol. -Consider increasing Losartan dose if blood pressure rises.  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 Experiences occasional agitation.  It is difficult for her to find time for her exercise and medial appointments. -Encourage seeking additional support as needed. -Consider resources for in-home care if stress increases.             Dispo: F/u 1 year  Signed, Chilton Si, MD

## 2022-12-20 NOTE — Patient Instructions (Signed)
Medication Instructions:  STOP METOPROLOL   *If you need a refill on your cardiac medications before your next appointment, please call your pharmacy*  Lab Work: NONE   Testing/Procedures: NONE  Follow-Up: At [facility], you and your health needs are our priority.  As part of our continuing mission to provide you with exceptional heart care, we have created designated Provider Care Teams.  These Care Teams include your primary Cardiologist (physician) and Advanced Practice Providers (APPs -  Physician Assistants and Nurse Practitioners) who all work together to provide you with the care you need, when you need it.  We recommend signing up for the patient portal called "MyChart".  Sign up information is provided on this After Visit Summary.  MyChart is used to connect with patients for Virtual Visits (Telemedicine).  Patients are able to view lab/test results, encounter notes, upcoming appointments, etc.  Non-urgent messages can be sent to your provider as well.   To learn more about what you can do with MyChart, go to [email or web address].    Your next appointment:   12 month(s)  Provider:   Chilton Si, MD or Gillian Shields, NP

## 2022-12-27 DIAGNOSIS — D49512 Neoplasm of unspecified behavior of left kidney: Secondary | ICD-10-CM | POA: Diagnosis not present

## 2022-12-29 ENCOUNTER — Encounter: Payer: Self-pay | Admitting: Urology

## 2022-12-29 ENCOUNTER — Other Ambulatory Visit: Payer: Self-pay | Admitting: Pharmacist

## 2022-12-29 NOTE — Progress Notes (Signed)
Care Coordination Call  [employer] to follow up on status of 2025 application. Clarifications needed. Refaxed to [employer].   Catie Eppie Gibson, PharmD, BCACP, CPP Clinical Pharmacist [facility] [phone]

## 2022-12-31 ENCOUNTER — Other Ambulatory Visit: Payer: Self-pay | Admitting: Urology

## 2022-12-31 DIAGNOSIS — D49512 Neoplasm of unspecified behavior of left kidney: Secondary | ICD-10-CM

## 2023-01-04 ENCOUNTER — Other Ambulatory Visit: Payer: Self-pay | Admitting: Internal Medicine

## 2023-01-04 DIAGNOSIS — Z1231 Encounter for screening mammogram for malignant neoplasm of breast: Secondary | ICD-10-CM

## 2023-01-06 ENCOUNTER — Encounter: Payer: Self-pay | Admitting: Urology

## 2023-01-20 ENCOUNTER — Other Ambulatory Visit: Payer: Medicare HMO

## 2023-01-25 ENCOUNTER — Other Ambulatory Visit: Payer: Self-pay | Admitting: Internal Medicine

## 2023-01-25 DIAGNOSIS — E1169 Type 2 diabetes mellitus with other specified complication: Secondary | ICD-10-CM

## 2023-02-09 ENCOUNTER — Ambulatory Visit
Admission: RE | Admit: 2023-02-09 | Discharge: 2023-02-09 | Disposition: A | Discharge status: Home / Self Care | Payer: Medicare HMO | Source: Ambulatory Visit | Attending: Internal Medicine | Admitting: Internal Medicine

## 2023-02-09 DIAGNOSIS — D49512 Neoplasm of unspecified behavior of left kidney: Secondary | ICD-10-CM | POA: Diagnosis not present

## 2023-02-09 DIAGNOSIS — Z1231 Encounter for screening mammogram for malignant neoplasm of breast: Secondary | ICD-10-CM | POA: Diagnosis not present

## 2023-02-15 DIAGNOSIS — D1771 Benign lipomatous neoplasm of kidney: Secondary | ICD-10-CM | POA: Diagnosis not present

## 2023-02-15 DIAGNOSIS — Z9049 Acquired absence of other specified parts of digestive tract: Secondary | ICD-10-CM | POA: Diagnosis not present

## 2023-02-16 ENCOUNTER — Other Ambulatory Visit: Payer: Self-pay

## 2023-02-16 DIAGNOSIS — E1169 Type 2 diabetes mellitus with other specified complication: Secondary | ICD-10-CM

## 2023-02-16 MED ORDER — ATORVASTATIN CALCIUM 20 MG PO TABS: ORAL_TABLET | ORAL | 1 refills | Status: DC

## 2023-03-01 DIAGNOSIS — M25552 Pain in left hip: Secondary | ICD-10-CM | POA: Diagnosis not present

## 2023-03-03 ENCOUNTER — Ambulatory Visit: Payer: Medicare HMO | Admitting: Podiatry

## 2023-03-03 ENCOUNTER — Encounter: Payer: Self-pay | Admitting: Podiatry

## 2023-03-03 VITALS — Ht 62.0 in | Wt 150.4 lb

## 2023-03-03 DIAGNOSIS — M205X2 Other deformities of toe(s) (acquired), left foot: Secondary | ICD-10-CM | POA: Diagnosis not present

## 2023-03-04 ENCOUNTER — Other Ambulatory Visit: Payer: Self-pay | Admitting: Internal Medicine

## 2023-03-04 ENCOUNTER — Telehealth: Payer: Self-pay

## 2023-03-04 NOTE — Telephone Encounter (Signed)
 Called [employer] on 1/10 to follow up on Ozempic  PAP-  They need her proof of income-sent mychart message 1/10.  Left voice mail 2/7 to follow up on proof of income

## 2023-03-08 ENCOUNTER — Other Ambulatory Visit: Payer: Self-pay

## 2023-03-08 MED ORDER — LOSARTAN POTASSIUM 50 MG PO TABS
50.0000 mg | ORAL_TABLET | Freq: Every day | ORAL | 2 refills | Status: DC
  Filled 2023-03-08: qty 90, 90d supply, fill #0

## 2023-03-10 ENCOUNTER — Other Ambulatory Visit: Payer: Self-pay

## 2023-03-10 MED ORDER — LOSARTAN POTASSIUM 50 MG PO TABS: 50.0000 mg | ORAL_TABLET | Freq: Every day | ORAL | 2 refills | Status: DC

## 2023-03-13 ENCOUNTER — Encounter: Payer: Self-pay | Admitting: Podiatry

## 2023-03-13 NOTE — Progress Notes (Signed)
  Subjective:  Patient ID: ZENITA KISTER, female    DOB: 1955-02-05,  MRN: [id number]  68 y.o. female presents to clinic with callus(es) right foot and painful elongated toenails that are difficult to trim. Painful toenails interfere with ambulation. Aggravating factors include wearing enclosed shoe gear. Pain is relieved with periodic professional debridement. Painful calluses are aggravated when weightbearing with and without shoegear. Pain is relieved with periodic professional debridement. Patient states her toes seem to be curling under when she is walking. She has been wearing [employer], [employer] and [employer]. Chief Complaint  Patient presents with   Nail Problem    Pt is here for [facility] last A1C was 5.7 PCP is Dr Jarold and LOV was in November.   PCP is Jarold Medici, MD.  No Known Allergies  Review of Systems: Negative except as noted in the HPI.   Objective:  LADORA OSTERBERG is a pleasant 68 y.o. female WD, WN in NAD. AAO x 3.  Vascular Examination: Vascular status intact b/l with palpable pedal pulses. CFT immediate b/l. No edema. No pain with calf compression b/l. Skin temperature gradient WNL b/l.   Neurological Examination: Sensation grossly intact b/l with 10 gram monofilament. Vibratory sensation intact b/l.   Dermatological Examination: Pedal skin with normal turgor, texture and tone b/l. Toenails left great toe thick, discolored, elongated with subungual debris and pain on dorsal palpation. Hyperkeratotic lesion(s) submet head 3 right foot and submet head 4 right foot.  No erythema, no edema, no drainage, no fluctuance.  Musculoskeletal Examination: Muscle strength 5/5 to b/l LE. Adductovarus deformity bilateral 5th toes.  Radiographs: None  Last A1c:      Latest Ref Rng & Units 12/06/2022    3:53 PM 08/24/2022    4:23 PM 05/18/2022    3:39 PM  Hemoglobin A1C  Hemoglobin-A1c 4.8 - 5.6 % 5.9  5.9  6.2      Assessment:   1. 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 -Discussed change in shoe gear and recommended [place] sneakers.  -Toenails were debrided in length and girth left great toe with sterile nail nippers and dremel without iatrogenic bleeding.  -As a courtesy, corn(s) R 5th toe, callus(es) submet head 3, 4 right foot pared utilizing sterile scalpel blade without complication or incident. Total number pared=1. -Patient/POA to call should there be question/concern in the interim.  Return in about 3 months (around 05/31/2023).  Delon LITTIE Merlin, DPM      [place] LOCATION: [address], [place] [address]                   Office [phone]   [facility] LOCATION: [address] [place], [place] [address] Office [phone]

## 2023-03-14 NOTE — Telephone Encounter (Signed)
 Atttempted top call patient ti provide proof of income for Ozempic PAP.  Patient has not returned phone calls or responded to [employer].  Closing encounter as [employer] will void application after 60 days of no response.

## 2023-03-15 DIAGNOSIS — M25552 Pain in left hip: Secondary | ICD-10-CM | POA: Diagnosis not present

## 2023-03-30 ENCOUNTER — Telehealth: Payer: Self-pay

## 2023-03-30 NOTE — Telephone Encounter (Signed)
 [employer] needed income and household clarity- sent document to [employer] that Patient provided.  Will follow up soon.

## 2023-04-05 NOTE — Telephone Encounter (Signed)
 [employer] Rep and b/c the original application was started in November- it's been over 90 days before the missing income, etc documents were provided- the application has to start over- at this time Patient wishes not to try again for PAP- will speak to her MD at upcoming appt. In April.

## 2023-04-19 ENCOUNTER — Telehealth: Payer: Self-pay

## 2023-04-19 NOTE — Telephone Encounter (Signed)
 Called patient to inform patient assistance is ready for pick up,LVM. MY CHART MESSAGE SENT.

## 2023-04-20 ENCOUNTER — Telehealth: Payer: Self-pay

## 2023-04-20 NOTE — Telephone Encounter (Signed)
PT PICKED UP OZEMPIC

## 2023-05-23 ENCOUNTER — Encounter: Payer: Self-pay | Admitting: Internal Medicine

## 2023-05-23 ENCOUNTER — Ambulatory Visit (INDEPENDENT_AMBULATORY_CARE_PROVIDER_SITE_OTHER): Payer: Self-pay | Admitting: Internal Medicine

## 2023-05-23 VITALS — BP 122/74 | HR 74 | Temp 98.2°F | Ht 62.0 in | Wt 150.0 lb

## 2023-05-23 DIAGNOSIS — J309 Allergic rhinitis, unspecified: Secondary | ICD-10-CM

## 2023-05-23 DIAGNOSIS — R0982 Postnasal drip: Secondary | ICD-10-CM

## 2023-05-23 DIAGNOSIS — E785 Hyperlipidemia, unspecified: Secondary | ICD-10-CM | POA: Diagnosis not present

## 2023-05-23 DIAGNOSIS — Z0000 Encounter for general adult medical examination without abnormal findings: Secondary | ICD-10-CM

## 2023-05-23 DIAGNOSIS — I119 Hypertensive heart disease without heart failure: Secondary | ICD-10-CM | POA: Diagnosis not present

## 2023-05-23 DIAGNOSIS — E2839 Other primary ovarian failure: Secondary | ICD-10-CM

## 2023-05-23 DIAGNOSIS — S30811A Abrasion of abdominal wall, initial encounter: Secondary | ICD-10-CM

## 2023-05-23 DIAGNOSIS — I2510 Atherosclerotic heart disease of native coronary artery without angina pectoris: Secondary | ICD-10-CM | POA: Diagnosis not present

## 2023-05-23 DIAGNOSIS — E1169 Type 2 diabetes mellitus with other specified complication: Secondary | ICD-10-CM

## 2023-05-23 LAB — POCT URINALYSIS DIPSTICK
Bilirubin, UA: NEGATIVE
Blood, UA: NEGATIVE
Glucose, UA: NEGATIVE
Ketones, UA: NEGATIVE
Nitrite, UA: NEGATIVE
Protein, UA: NEGATIVE
Spec Grav, UA: 1.02 (ref 1.010–1.025)
Urobilinogen, UA: 0.2 U/dL
pH, UA: 5.5 (ref 5.0–8.0)

## 2023-05-23 NOTE — Progress Notes (Signed)
 I,Victoria T Basil Lim, CMA,acting as a Neurosurgeon for Smiley Dung, MD.,have documented all relevant documentation on the behalf of Smiley Dung, MD,as directed by  Smiley Dung, MD while in the presence of Smiley Dung, MD.  Subjective:    Patient ID: Chelsea Landry , female    DOB: April 15, 1955 , 68 y.o.   MRN: [id number]  Chief Complaint  Patient presents with   Annual Exam    Patient presents today for annual exam. She reports compliance with medications. Denies headache, chest pain & sob. She wants a place on her belly examined while here today. She also states missing 3 weeks of her ozempic . She wants to know Is she to continue with previous dose or start over.    Diabetes   Hypertension    HPI Discussed the use of AI scribe software for clinical note transcription with the patient, who gave verbal consent to proceed.  History of Present Illness Chelsea Landry is a 68 year old female with diabetes who presents for a physical exam and diabetes follow-up.  She does not regularly check her blood sugar levels but mentions she could use her husband's meter if necessary. She is currently taking atorvastatin  20 mg, losartan  50 mg, and was on 0.5 mg of Ozempic  but will restart at 0.25 mg.  She has been off of her meds due to delayed shipment.   She reports a new skin issue, describing a spot under her skin folds that itches and has been scratched. She first noticed it yesterday and describes it as itchy and that she 'probably scratched it pretty good.'  She experiences symptoms related to pollen, including puffy eyes and a persistent tickling sensation in her throat, for which she uses cough drops for relief.  She engages in regular physical activity, exercising with her husband on Monday and Wednesday mornings and participating in water aerobics on Tuesdays and Thursdays. She is the primary caregiver for her husband, who has dementia, and manages his care with the help of her cousin  when needed.  No chest pain or shortness of breath during exercise. She reports good bowel movements daily.   Diabetes She presents for her follow-up diabetic visit. She has type 2 diabetes mellitus. Her disease course has been stable. There are no hypoglycemic associated symptoms. There are no diabetic associated symptoms. Pertinent negatives for diabetes include no blurred vision and no chest pain. There are no hypoglycemic complications. Symptoms are stable. There are no diabetic complications. Risk factors for coronary artery disease include diabetes mellitus, dyslipidemia, hypertension and post-menopausal. She is compliant with treatment most of the time.  Hypertension This is a chronic problem. The current episode started more than 1 year ago. The problem has been gradually improving since onset. Pertinent negatives include no blurred vision, chest pain, orthopnea, palpitations or shortness of breath. Past treatments include angiotensin blockers and beta blockers. The current treatment provides moderate improvement.     Past Medical History:  Diagnosis Date   Caregiver stress 12/20/2022   Fibroids    Frequency of urination    Hypertension    Rash    RT LEG   SVD (spontaneous vaginal delivery)    x 2   Vaginal lesion      Family History  Problem Relation Age of Onset   Acute lymphoblastic leukemia Mother 15   Aneurysm Father        brain   Anuerysm Father    Breast cancer Sister 47 - 52  Lung cancer Sister 17       non-smoker   Brain cancer Maternal Aunt    Breast cancer Paternal Aunt 25 - 77   Brain cancer Brother 40   Hypertension Other      Current Outpatient Medications:    atorvastatin  (LIPITOR) 20 MG tablet, TAKE ONCE DAILY. MON-SAT SKIP SUNDAYS., Disp: 90 tablet, Rfl: 1   Cholecalciferol (VITAMIN D3) 50 MCG (2000 UT) capsule, Take 2,000 Units by mouth daily., Disp: , Rfl:    Insulin Pen Needle (NOVOFINE PEN NEEDLE) 32G X 6 MM MISC, USE ONCE DAILY AS NEEDED.,  Disp: 100 each, Rfl: 1   losartan  (COZAAR ) 50 MG tablet, Take 1 tablet (50 mg total) by mouth daily., Disp: 90 tablet, Rfl: 2   Multiple Vitamin (MULTIVITAMIN WITH MINERALS) TABS tablet, Take 1 tablet by mouth daily., Disp: , Rfl:    Semaglutide ,0.25 or 0.5MG /DOS, (OZEMPIC , 0.25 OR 0.5 MG/DOSE,) 2 MG/3ML SOPN, INJECT 0.50 MG INTO THE SKIN WEEKLY, Disp: 9 mL, Rfl: 1   KRILL OIL PO, Take 1 capsule by mouth daily. (Patient not taking: Reported on 05/23/2023), Disp: , Rfl:    No Known Allergies    The patient states she uses post menopausal status for birth control. No LMP recorded. Patient is postmenopausal.. Negative for Dysmenorrhea. Negative for: breast discharge, breast lump(s), breast pain and breast self exam. Associated symptoms include abnormal vaginal bleeding. Pertinent negatives include abnormal bleeding (hematology), anxiety, decreased libido, depression, difficulty falling sleep, dyspareunia, history of infertility, nocturia, sexual dysfunction, sleep disturbances, urinary incontinence, urinary urgency, vaginal discharge and vaginal itching. Diet regular.The patient states her exercise level is    . The patient's tobacco use is:  Social History   Tobacco Use  Smoking Status Never  Smokeless Tobacco Never  . She has been exposed to passive smoke. The patient's alcohol use is:  Social History   Substance and Sexual Activity  Alcohol Use No    Review of Systems  Constitutional: Negative.   HENT: Negative.    Eyes: Negative.  Negative for blurred vision.  Respiratory: Negative.  Negative for shortness of breath.   Cardiovascular: Negative.  Negative for chest pain, palpitations and orthopnea.  Gastrointestinal: Negative.   Endocrine: Negative.   Genitourinary: Negative.   Musculoskeletal: Negative.   Skin: Negative.   Allergic/Immunologic: Negative.   Neurological: Negative.   Hematological: Negative.   Psychiatric/Behavioral: Negative.       Today's Vitals   05/23/23  1406  BP: 122/74  Pulse: 74  Temp: 98.2 F (36.8 C)  SpO2: 98%  Weight: 150 lb (68 kg)  Height: 5\' 2"  (1.575 m)   Body mass index is 27.44 kg/m.  Wt Readings from Last 3 Encounters:  05/23/23 150 lb (68 kg)  03/03/23 150 lb 6.4 oz (68.2 kg)  12/20/22 150 lb 6.4 oz (68.2 kg)     Objective:  Physical Exam Vitals and nursing note reviewed.  Constitutional:      Appearance: Normal appearance.  HENT:     Head: Normocephalic and atraumatic.     Right Ear: Tympanic membrane, ear canal and external ear normal. There is no impacted cerumen.     Left Ear: Tympanic membrane, ear canal and external ear normal. There is no impacted cerumen.     Mouth/Throat:     Mouth: Mucous membranes are dry.     Pharynx: Oropharynx is clear.  Eyes:     Extraocular Movements: Extraocular movements intact.  Cardiovascular:     Rate and Rhythm: Normal  rate and regular rhythm.     Pulses:          Dorsalis pedis pulses are 2+ on the right side and 2+ on the left side.     Heart sounds: Normal heart sounds.  Pulmonary:     Effort: Pulmonary effort is normal.     Breath sounds: Normal breath sounds.  Chest:  Breasts:    Tanner Score is 5.     Right: Normal.     Left: Normal.  Abdominal:     General: Bowel sounds are normal.     Palpations: Abdomen is soft.  Genitourinary:    Comments: Deferred  Musculoskeletal:        General: Normal range of motion.     Cervical back: Normal range of motion.  Feet:     Right foot:     Protective Sensation: 5 sites tested.  5 sites sensed.     Skin integrity: Skin integrity normal.     Toenail Condition: Right toenails are normal.     Left foot:     Protective Sensation: 5 sites tested.  5 sites sensed.     Skin integrity: Skin integrity normal.     Toenail Condition: Left toenails are normal.  Skin:    General: Skin is warm.  Neurological:     General: No focal deficit present.     Mental Status: She is alert.  Psychiatric:        Mood and Affect:  Mood normal.        Behavior: Behavior normal.          Assessment And Plan:     Encounter for annual physical exam Assessment & Plan: A full exam was performed.  Importance of monthly self breast exams was discussed with the patient.  She is advised to get 30-45 minutes of regular exercise, no less than four to five days per week. Both weight-bearing and aerobic exercises are recommended.  She is advised to follow a healthy diet with at least six fruits/veggies per day, decrease intake of red meat and other saturated fats and to increase fish intake to twice weekly.  Meats/fish should not be fried -- baked, boiled or broiled is preferable. It is also important to cut back on your sugar intake.  Be sure to read labels - try to avoid anything with added sugar, high fructose corn syrup or other sweeteners.  If you must use a sweetener, you can try stevia or monkfruit.  It is also important to avoid artificially sweetened foods/beverages and diet drinks. Lastly, wear SPF 50 sunscreen on exposed skin and when in direct sunlight for an extended period of time.  Be sure to avoid fast food restaurants and aim for at least 60 ounces of water daily.       Dyslipidemia associated with type 2 diabetes mellitus (HCC) Assessment & Plan: Chronic, LDL goal is less than 70. She is now taking atorvastatin  20mg  daily. Sept 2024 A1c less than 6.0. Diabetic foot exam was performed.  Due to delay in shipment, I will resume Ozempic  at 0.25mg  x 2-4 weeks, then increase to 0.5mg  weekly.  She will f/lu in 4 months for re-evaluation. I DISCUSSED WITH THE PATIENT AT LENGTH REGARDING THE GOALS OF GLYCEMIC CONTROL AND POSSIBLE LONG-TERM COMPLICATIONS.  I  ALSO STRESSED THE IMPORTANCE OF COMPLIANCE WITH HOME GLUCOSE MONITORING, DIETARY RESTRICTIONS INCLUDING AVOIDANCE OF SUGARY DRINKS/PROCESSED FOODS,  ALONG WITH REGULAR EXERCISE.  I  ALSO STRESSED THE IMPORTANCE OF ANNUAL EYE  EXAMS, SELF FOOT CARE AND COMPLIANCE WITH OFFICE  VISITS.   Orders: -     CBC -     CMP14+EGFR -     Lipid panel -     Hemoglobin A1c -     POCT urinalysis dipstick -     Microalbumin / creatinine urine ratio -     EKG 12-Lead  Hypertensive heart disease without heart failure Assessment & Plan: Chronic, well controlled. EKG performed, NSR w/ nonspecific T abnormality.  She will continue with losartan  and metoprolol .  Encouraged to follow low sodium diet.   Orders: -     CBC -     CMP14+EGFR -     Lipid panel -     EKG 12-Lead  Coronary artery calcification seen on CAT scan Assessment & Plan: Coronary calcium  score of 31.7. This was 70th percentile for age-, race-, and sex-matched controls. She agrees to Cardiology evaluation for further risk assessment. Currently, she is asymptomatic - but has untreated OSA.    Abrasion of abdominal wall, initial encounter Assessment & Plan: Recent abrasion under abdominal fold, no infection. Discussed potential yeast infection and insurance documentation for periculectomy. - Apply bacitracin ointment twice daily to abrasion. - Consider Gold Bond powder to prevent yeast infection in skin folds. .   Allergic rhinitis with postnasal drip Assessment & Plan: Symptoms include puffy eyes and persistent tickling sensation. She is using cough drops for symptom relief.    Return for 1 year HM, 4 month dm f/u.Aaron Aas Patient was given opportunity to ask questions. Patient verbalized understanding of the plan and was able to repeat key elements of the plan. All questions were answered to their satisfaction.    I, Smiley Dung, MD, have reviewed all documentation for this visit. The documentation on 05/23/23 for the exam, diagnosis, procedures, and orders are all accurate and complete.

## 2023-05-23 NOTE — Patient Instructions (Signed)

## 2023-05-24 LAB — MICROALBUMIN / CREATININE URINE RATIO
Creatinine, Urine: 107.7 mg/dL
Microalb/Creat Ratio: <3 mg/g{creat} (ref 0–29)
Microalbumin, Urine: <3 ug/mL

## 2023-05-24 LAB — HEMOGLOBIN A1C
Est. average glucose Bld gHb Est-mCnc: 123 mg/dL
Hgb A1c MFr Bld: 5.9 % — ABNORMAL HIGH (ref 4.8–5.6)

## 2023-05-24 LAB — CMP14+EGFR
ALT: 57 IU/L — ABNORMAL HIGH (ref 0–32)
AST: 41 IU/L — ABNORMAL HIGH (ref 0–40)
Albumin: 4.1 g/dL (ref 3.9–4.9)
Alkaline Phosphatase: 77 IU/L (ref 44–121)
BUN/Creatinine Ratio: 18 (ref 12–28)
BUN: 14 mg/dL (ref 8–27)
Bilirubin Total: 0.4 mg/dL (ref 0.0–1.2)
CO2: 25 mmol/L (ref 20–29)
Calcium: 8.9 mg/dL (ref 8.7–10.3)
Chloride: 104 mmol/L (ref 96–106)
Creatinine, Ser: 0.78 mg/dL (ref 0.57–1.00)
Globulin, Total: 2.2 g/dL (ref 1.5–4.5)
Glucose: 80 mg/dL (ref 70–99)
Potassium: 4.4 mmol/L (ref 3.5–5.2)
Sodium: 141 mmol/L (ref 134–144)
Total Protein: 6.3 g/dL (ref 6.0–8.5)
eGFR: 83 mL/min/{1.73_m2} (ref >59)

## 2023-05-24 LAB — LIPID PANEL
Chol/HDL Ratio: 2.4 ratio (ref 0.0–4.4)
Cholesterol, Total: 146 mg/dL (ref 100–199)
HDL: 62 mg/dL (ref >39)
LDL Chol Calc (NIH): 60 mg/dL (ref 0–99)
Triglycerides: 144 mg/dL (ref 0–149)
VLDL Cholesterol Cal: 24 mg/dL (ref 5–40)

## 2023-05-24 LAB — CBC
Hematocrit: 39.5 % (ref 34.0–46.6)
Hemoglobin: 13.3 g/dL (ref 11.1–15.9)
MCH: 30.1 pg (ref 26.6–33.0)
MCHC: 33.7 g/dL (ref 31.5–35.7)
MCV: 89 fL (ref 79–97)
Platelets: 235 10*3/uL (ref 150–450)
RBC: 4.42 x10E6/uL (ref 3.77–5.28)
RDW: 12.5 % (ref 11.7–15.4)
WBC: 5.4 10*3/uL (ref 3.4–10.8)

## 2023-05-26 ENCOUNTER — Ambulatory Visit
Admission: RE | Admit: 2023-05-26 | Discharge: 2023-05-26 | Disposition: A | Discharge status: Home / Self Care | Payer: Medicare HMO | Source: Ambulatory Visit | Attending: Internal Medicine | Admitting: Internal Medicine

## 2023-05-26 DIAGNOSIS — E2839 Other primary ovarian failure: Secondary | ICD-10-CM

## 2023-05-26 DIAGNOSIS — N958 Other specified menopausal and perimenopausal disorders: Secondary | ICD-10-CM | POA: Diagnosis not present

## 2023-05-28 DIAGNOSIS — S30811A Abrasion of abdominal wall, initial encounter: Secondary | ICD-10-CM | POA: Insufficient documentation

## 2023-05-28 DIAGNOSIS — Z0000 Encounter for general adult medical examination without abnormal findings: Secondary | ICD-10-CM | POA: Insufficient documentation

## 2023-05-28 NOTE — Assessment & Plan Note (Signed)
Coronary calcium score of 31.7. This was 70th percentile for age-, race-, and sex-matched controls. She agrees to Cardiology evaluation for further risk assessment. Currently, she is asymptomatic - but has untreated OSA.

## 2023-05-28 NOTE — Assessment & Plan Note (Signed)
 Chronic, LDL goal is less than 70. She is now taking atorvastatin  20mg  daily. Sept 2024 A1c less than 6.0. Diabetic foot exam was performed.  Due to delay in shipment, I will resume Ozempic  at 0.25mg  x 2-4 weeks, then increase to 0.5mg  weekly.  She will f/lu in 4 months for re-evaluation. I DISCUSSED WITH THE PATIENT AT LENGTH REGARDING THE GOALS OF GLYCEMIC CONTROL AND POSSIBLE LONG-TERM COMPLICATIONS.  I  ALSO STRESSED THE IMPORTANCE OF COMPLIANCE WITH HOME GLUCOSE MONITORING, DIETARY RESTRICTIONS INCLUDING AVOIDANCE OF SUGARY DRINKS/PROCESSED FOODS,  ALONG WITH REGULAR EXERCISE.  I  ALSO STRESSED THE IMPORTANCE OF ANNUAL EYE EXAMS, SELF FOOT CARE AND COMPLIANCE WITH OFFICE VISITS.

## 2023-05-28 NOTE — Assessment & Plan Note (Signed)

## 2023-05-29 ENCOUNTER — Encounter: Payer: Self-pay | Admitting: Internal Medicine

## 2023-05-29 DIAGNOSIS — R0982 Postnasal drip: Secondary | ICD-10-CM | POA: Insufficient documentation

## 2023-05-29 NOTE — Assessment & Plan Note (Signed)
 Recent abrasion under abdominal fold, no infection. Discussed potential yeast infection and insurance documentation for periculectomy. - Apply bacitracin ointment twice daily to abrasion. - Consider Gold Bond powder to prevent yeast infection in skin folds. Aaron Aas

## 2023-05-29 NOTE — Assessment & Plan Note (Signed)
 Symptoms include puffy eyes and persistent tickling sensation. She is using cough drops for symptom relief.

## 2023-05-29 NOTE — Assessment & Plan Note (Signed)
 Chronic, well controlled. EKG performed, NSR w/ nonspecific T abnormality.  She will continue with losartan  and metoprolol .  Encouraged to follow low sodium diet.

## 2023-06-01 ENCOUNTER — Other Ambulatory Visit: Payer: Self-pay

## 2023-06-01 MED ORDER — LOSARTAN POTASSIUM 50 MG PO TABS: 50.0000 mg | ORAL_TABLET | Freq: Every day | ORAL | 2 refills | Status: DC

## 2023-06-06 ENCOUNTER — Telehealth: Payer: Self-pay

## 2023-06-06 NOTE — Telephone Encounter (Signed)
 PAP: Patient assistance application for Ozempic  through [employer] has been mailed to pt's home address on file. Provider portion of application will be faxed to provider's office. Spoke to patient and she wants to apply for PAP -e-filed patient portion and she has income document in media to send to [employer] if needed this time.

## 2023-06-09 NOTE — Telephone Encounter (Signed)
 PAP: Application for Ozempic has been submitted to [employer], via fax

## 2023-06-14 ENCOUNTER — Encounter: Payer: Self-pay | Admitting: Podiatry

## 2023-06-14 ENCOUNTER — Ambulatory Visit: Payer: Medicare HMO | Admitting: Podiatry

## 2023-06-14 VITALS — Ht 62.0 in | Wt 150.0 lb

## 2023-06-14 DIAGNOSIS — E1165 Type 2 diabetes mellitus with hyperglycemia: Secondary | ICD-10-CM | POA: Diagnosis not present

## 2023-06-14 DIAGNOSIS — M79674 Pain in right toe(s): Secondary | ICD-10-CM

## 2023-06-14 DIAGNOSIS — B351 Tinea unguium: Secondary | ICD-10-CM

## 2023-06-14 DIAGNOSIS — L84 Corns and callosities: Secondary | ICD-10-CM | POA: Diagnosis not present

## 2023-06-14 DIAGNOSIS — M79675 Pain in left toe(s): Secondary | ICD-10-CM | POA: Diagnosis not present

## 2023-06-14 NOTE — Progress Notes (Unsigned)
  Subjective:  Patient ID: Chelsea Landry, female    DOB: 1955-07-02,  MRN: [id number]  Chelsea Landry presents to clinic today for {jgcomplaint:23593}  Chief Complaint  Patient presents with   Nail Problem    Pt is here for [facility] last A1C was 5.7 PCP is Dr Elnita Hai and LOV was in April.   New problem(s): None. {jgcomplaint:23593}  PCP is Cleave Curling, MD.  No Known Allergies  Review of Systems: Negative except as noted in the HPI.  Objective: No changes noted in today's physical examination. There were no vitals filed for this visit. Chelsea Landry is a pleasant 68 y.o. female {jgbodyhabitus:24098} AAO x 3.  Vascular Examination: Capillary refill time immediate b/l. Vascular status intact b/l with palpable pedal pulses. Pedal hair present b/l. No pain with calf compression b/l. Skin temperature gradient WNL b/l. No cyanosis or clubbing b/l. No ischemia or gangrene noted b/l. {jgvascular:23595}  Neurological Examination: Sensation grossly intact b/l with 10 gram monofilament. Vibratory sensation intact b/l. {jgneuro:23601::"Protective sensation intact 5/5 intact bilaterally with 10g monofilament b/l.","Vibratory sensation intact b/l.","Proprioception intact bilaterally."}  Dermatological Examination: Pedal skin with normal turgor, texture and tone b/l.  No open wounds. No interdigital macerations.   Toenails 1-5 b/l thick, discolored, elongated with subungual debris and pain on dorsal palpation.   {jgderm:23598}  Musculoskeletal Examination: {jgmsk:23600}  Radiographs: None  {jgxrayfindings:23683}  Last A1c:      Latest Ref Rng & Units 05/23/2023    3:31 PM 12/06/2022    3:53 PM 08/24/2022    4:23 PM  Hemoglobin A1C  Hemoglobin-A1c 4.8 - 5.6 % 5.9  5.9  5.9     Assessment/Plan: No diagnosis found.  No orders of the defined types were placed in this encounter.   None {Jgplan:23602::"-Patient/POA to call should there be question/concern in the interim."}   Return  in about 3 months (around 09/14/2023).  Luella Sager, DPM      [facility] LOCATION: [address] [place] [address]                   Office [phone]   [facility] LOCATION: [address] [place], [place] [address] Office [phone]

## 2023-06-14 NOTE — Telephone Encounter (Signed)
 PAP: Patient assistance application for Ozempic  has been approved by PAP Companies: [employer] from 06/14/2023 to 01/25/2024. Medication should be delivered to PAP Delivery: Provider's office. For further shipping updates, please contact [employer] at [phone]. Patient ID is: [id number]

## 2023-06-14 NOTE — Progress Notes (Signed)
 Pharmacy Medication Assistance Program Note    06/14/2023  Patient ID: Chelsea Landry, female  DOB: Apr 15, 1955, 68 y.o.  MRN:  [id number]     06/14/2023  Outreach Medication Two  Initial Outreach Date (Medication Two) 06/06/2023  Manufacturer Medication Two [employer]  [employer] [facility] Ozempic   Dose of Ozempic  0.5 mg  Type of Radiographer, therapeutic Assistance  Date Application Sent to Patient 06/06/2023  Application Items Requested Application;Proof of Income  Date Application Sent to Prescriber 06/06/2023  Name of Prescriber Cleave Curling, MD  Date Application Received From Patient 06/06/2023  Application Items Received From Patient Application;Proof of Income  Date Application Received From Provider 06/09/2023  Method Application Sent to Manufacturer Fax  Date Application Submitted to Manufacturer 06/09/2023  Patient Assistance Determination Approved  Approval Start Date 06/14/2023  Patient Notification Method MyChart

## 2023-06-20 ENCOUNTER — Encounter: Payer: Self-pay | Admitting: Podiatry

## 2023-07-06 ENCOUNTER — Ambulatory Visit (INDEPENDENT_AMBULATORY_CARE_PROVIDER_SITE_OTHER): Payer: Medicare HMO

## 2023-07-06 VITALS — BP 110/64 | HR 77 | Temp 97.7°F | Ht 62.0 in | Wt 150.0 lb

## 2023-07-06 DIAGNOSIS — Z0000 Encounter for general adult medical examination without abnormal findings: Secondary | ICD-10-CM | POA: Diagnosis not present

## 2023-07-06 NOTE — Patient Instructions (Signed)
 [name] , Thank you for taking time out of your busy schedule to complete your Annual Wellness Visit with me. I enjoyed our conversation and look forward to speaking with you again next year. I, as well as your care team,  appreciate your ongoing commitment to your health goals. Please review the following plan we discussed and let me know if I can assist you in the future. Your Game plan/ To Do List    Referrals: If you haven't heard from the office you've been referred to, please reach out to them at the phone provided.  N/a Follow up Visits: Next Medicare AWV with our clinical staff: 08/15/2024 at 2:00   Have you seen your provider in the last 6 months (3 months if uncontrolled diabetes)? Yes Next Office Visit with your provider: 09/22/2023 at 2:00  Clinician Recommendations:  Aim for 30 minutes of exercise or brisk walking, 6-8 glasses of water, and 5 servings of fruits and vegetables each day.       This is a list of the screening recommended for you and due dates:  Health Maintenance  Topic Date Due   COVID-19 Vaccine (9 - 2024-25 season) 05/16/2023   Eye exam for diabetics  06/24/2023   Flu Shot  08/26/2023   Hemoglobin A1C  11/22/2023   Yearly kidney function blood test for diabetes  05/22/2024   Yearly kidney health urinalysis for diabetes  05/22/2024   Complete foot exam   05/22/2024   Medicare Annual Wellness Visit  07/05/2024   Mammogram  02/08/2025   DTaP/Tdap/Td vaccine (3 - Td or Tdap) 02/07/2029   Colon Cancer Screening  01/09/2030   Pneumonia Vaccine  Completed   DEXA scan (bone density measurement)  Completed   Hepatitis C Screening  Completed   Zoster (Shingles) Vaccine  Completed   HPV Vaccine  Aged Out   Meningitis B Vaccine  Aged Out    Advanced directives: (Copy Requested) Please bring a copy of your health care power of attorney and living will to the office to be added to your chart at your convenience. You can mail to [facility] [address] [place], [place] [address] or email to [email or web address] Advance Care Planning is important because it:  [x]  Makes sure you receive the medical care that is consistent with your values, goals, and preferences  [x]  It provides guidance to your family and loved ones and reduces their decisional burden about whether or not they are making the right decisions based on your wishes.  Follow the link provided in your after visit summary or read over the paperwork we have mailed to you to help you started getting your Advance Directives in place. If you need assistance in completing these, please reach out to us  so that we can help you!  See attachments for Preventive Care and Fall Prevention Tips.

## 2023-07-06 NOTE — Progress Notes (Signed)
 Subjective:   Chelsea Landry is a 68 y.o. who presents for a Medicare Wellness preventive visit.  As a reminder, Annual Wellness Visits don't include a physical exam, and some assessments may be limited, especially if this visit is performed virtually. We may recommend an in-person follow-up visit with your provider if needed.  Visit Complete: In person    Persons Participating in Visit: Patient.  AWV Questionnaire: No: Patient Medicare AWV questionnaire was not completed prior to this visit.  Cardiac Risk Factors include: advanced age (>42men, >82 women);diabetes mellitus;hypertension     Objective:     Today's Vitals   07/06/23 1421  BP: 110/64  Pulse: 77  Temp: 97.7 F (36.5 C)  TempSrc: Oral  SpO2: 94%  Weight: 150 lb (68 kg)  Height: 5' 2 (1.575 m)   Body mass index is 27.44 kg/m.     07/06/2023    2:25 PM 06/30/2022    3:26 PM 02/02/2016   10:30 PM 12/31/2013    6:30 PM 12/31/2013   12:16 PM 12/27/2013    9:47 AM 12/19/2013   10:42 AM  Advanced Directives  Does Patient Have a Medical Advance Directive? Yes Yes No No  No No  Type of Estate agent of [place];Living will Healthcare Power of [place];Living will       Copy of Healthcare Power of Attorney in Chart? No - copy requested No - copy requested       Would patient like information on creating a medical advance directive?    Yes - Educational materials given --  Yes - Educational materials given    Current Medications (verified) Outpatient Encounter Medications as of 07/06/2023  Medication Sig   atorvastatin  (LIPITOR) 20 MG tablet TAKE ONCE DAILY. MON-SAT SKIP SUNDAYS.   Cholecalciferol (VITAMIN D3) 50 MCG (2000 UT) capsule Take 2,000 Units by mouth daily.   Insulin Pen Needle (NOVOFINE PEN NEEDLE) 32G X 6 MM MISC USE ONCE DAILY AS NEEDED.   losartan  (COZAAR ) 50 MG tablet Take 1 tablet (50 mg total) by mouth daily.   Multiple Vitamin (MULTIVITAMIN WITH MINERALS) TABS tablet Take 1  tablet by mouth daily.   Semaglutide ,0.25 or 0.5MG /DOS, (OZEMPIC , 0.25 OR 0.5 MG/DOSE,) 2 MG/3ML SOPN INJECT 0.50 MG INTO THE SKIN WEEKLY   KRILL OIL PO Take 1 capsule by mouth daily. (Patient not taking: Reported on 05/23/2023)   No facility-administered encounter medications on file as of 07/06/2023.    Allergies (verified) Patient has no known allergies.   History: Past Medical History:  Diagnosis Date   Caregiver stress 12/20/2022   Fibroids    Frequency of urination    Hypertension    Rash    RT LEG   SVD (spontaneous vaginal delivery)    x 2   Vaginal lesion    Past Surgical History:  Procedure Laterality Date   BREAST BIOPSY Right 05/13/2022   US  RT BREAST BX W LOC DEV 1ST LESION IMG BX SPEC US  GUIDE 05/13/2022 [facility] MAMMOGRAPHY   BREATH TEK H PYLORI N/A 04/17/2013   Procedure: BREATH TEK H PYLORI;  Surgeon: Azucena Bollard, MD;  Location: [facility];  Service: General;  Laterality: N/A;   CHOLECYSTECTOMY  01/26/1999   COLONOSCOPY  01/26/2004   DILATATION & CURETTAGE/HYSTEROSCOPY WITH MYOSURE N/A 12/27/2013   Procedure: DILATATION & CURETTAGE/HYSTEROSCOPY WITH MYOSURE;  Surgeon: Marylu Soda, MD;  Location: [facility];  Service: Gynecology;  Laterality: N/A;   HYSTEROSCOPY     LAPAROSCOPIC GASTRIC SLEEVE RESECTION N/A  12/31/2013   Procedure: LAPAROSCOPIC GASTRIC SLEEVE RESECTION w/Upper GI/gen and hiatal hernia repair;  Surgeon: Azucena Bollard, MD;  Location: [facility];  Service: General;  Laterality: N/A;   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   Family History  Problem Relation Age of Onset   Acute lymphoblastic leukemia Mother 19   Aneurysm Father        brain   Anuerysm Father    Breast cancer Sister 65 - 77   Lung cancer Sister 44       non-smoker   Brain cancer Maternal Aunt    Breast cancer Paternal Aunt 66 - 25   Brain cancer Brother 40   Hypertension Other    Social History   Socioeconomic History   Marital status: Married    Spouse name: Not  on file   Number of children: Not on file   Years of education: Not on file   Highest education level: Not on file  Occupational History   Occupation: Retired  Tobacco Use   Smoking status: Never   Smokeless tobacco: Never  Vaping Use   Vaping status: Never Used  Substance and Sexual Activity   Alcohol use: No   Drug use: No   Sexual activity: Yes    Birth control/protection: Post-menopausal, Surgical    Comment: husband vasectomy  Other Topics Concern   Not on file  Social History Narrative   Not on file   Social Drivers of Health   Financial Resource Strain: Low Risk  (07/06/2023)   Overall Financial Resource Strain (CARDIA)    Difficulty of Paying Living Expenses: Not hard at all  Food Insecurity: No Food Insecurity (07/06/2023)   Hunger Vital Sign    Worried About Running Out of Food in the Last Year: Never true    Ran Out of Food in the Last Year: Never true  Transportation Needs: No Transportation Needs (07/06/2023)   PRAPARE - Administrator, Civil Service (Medical): No    Lack of Transportation (Non-Medical): No  Physical Activity: Sufficiently Active (07/06/2023)   Exercise Vital Sign    Days of Exercise per Week: 4 days    Minutes of Exercise per Session: 50 min  Stress: Stress Concern Present (07/06/2023)   [employer] of Occupational Health - Occupational Stress Questionnaire    Feeling of Stress : To some extent  Social Connections: Moderately Integrated (07/06/2023)   Social Connection and Isolation Panel [NHANES]    Frequency of Communication with Friends and Family: More than three times a week    Frequency of Social Gatherings with Friends and Family: More than three times a week    Attends Religious Services: More than 4 times per year    Active Member of [employer] or Organizations: No    Attends Engineer, structural: Never    Marital Status: Married    Tobacco Counseling Counseling given: Not Answered    Clinical  Intake:  Pre-visit preparation completed: Yes  Pain : No/denies pain     Nutritional Status: BMI 25 -29 Overweight Nutritional Risks: None Diabetes: Yes CBG done?: No Did pt. bring in CBG monitor from home?: No  Lab Results  Component Value Date   HGBA1C 5.9 (H) 05/23/2023   HGBA1C 5.9 (H) 12/06/2022   HGBA1C 5.9 (H) 08/24/2022     How often do you need to have someone help you when you read instructions, pamphlets, or other written materials from your doctor or pharmacy?: 1 - Never  Interpreter Needed?: No  Information entered by :: NAllen LPN   Activities of Daily Living     07/06/2023    2:22 PM  In your present state of health, do you have any difficulty performing the following activities:  Hearing? 0  Vision? 0  Difficulty concentrating or making decisions? 1  Comment memory sometimes  Walking or climbing stairs? 0  Dressing or bathing? 0  Doing errands, shopping? 0  Preparing Food and eating ? N  Using the Toilet? N  In the past six months, have you accidently leaked urine? N  Do you have problems with loss of bowel control? N  Managing your Medications? N  Managing your Finances? N  Housekeeping or managing your Housekeeping? N    Patient Care Team: Cleave Curling, MD as PCP - General (Internal Medicine)  I have updated your Care Teams any recent Medical Services you may have received from other providers in the past year.     Assessment:    This is a routine wellness examination for Joana.  Hearing/Vision screen Hearing Screening - Comments:: Denies hearing issues Vision Screening - Comments:: Regular eye exams, [facility] [place]   Goals Addressed             This Visit's Progress    Patient Stated       07/06/2023, get off medication       Depression Screen     07/06/2023    2:27 PM 05/23/2023    2:07 PM 12/06/2022    2:52 PM 10/13/2022    2:44 PM 08/24/2022    3:17 PM 06/30/2022    3:27 PM 05/18/2022    2:16 PM  PHQ 2/9  Scores  PHQ - 2 Score 0 0 0 0 0 0 0  PHQ- 9 Score 1 0 0 0 0  0    Fall Risk     07/06/2023    2:26 PM 05/23/2023    2:07 PM 12/06/2022    2:52 PM 10/13/2022    2:44 PM 08/24/2022    3:17 PM  Fall Risk   Falls in the past year? 0 0 0 0 0  Number falls in past yr: 0 0 0 0 0  Injury with Fall? 0 0 0 0 0  Risk for fall due to : Medication side effect No Fall Risks No Fall Risks No Fall Risks No Fall Risks  Follow up Falls evaluation completed;Falls prevention discussed Falls evaluation completed Falls evaluation completed Falls evaluation completed Falls evaluation completed    MEDICARE RISK AT HOME:  Medicare Risk at Home Any stairs in or around the home?: Yes If so, are there any without handrails?: No Home free of loose throw rugs in walkways, pet beds, electrical cords, etc?: Yes Adequate lighting in your home to reduce risk of falls?: Yes Life alert?: No Use of a cane, walker or w/c?: No Grab bars in the bathroom?: Yes Shower chair or bench in shower?: No Elevated toilet seat or a handicapped toilet?: Yes  TIMED UP AND GO:  Was the test performed?  Yes  Length of time to ambulate 10 feet: 5 sec Gait steady and fast without use of assistive device  Cognitive Function: 6CIT completed        07/06/2023    2:27 PM 06/30/2022    3:29 PM  6CIT Screen  What Year? 0 points 0 points  What month? 0 points 0 points  What time? 0 points 0 points  Count  back from 20 0 points 0 points  Months in reverse 0 points 0 points  Repeat phrase 0 points 0 points  Total Score 0 points 0 points    Immunizations Immunization History  Administered Date(s) Administered   Fluad Quad(high Dose 65+) 09/29/2021   Fluad Trivalent(High Dose 65+) 10/13/2022   Moderna Covid-19 Vaccine  Bivalent Booster 27yrs & up 10/21/2020   Moderna Sars-Covid-2 Vaccination 03/29/2019, 05/01/2019, 11/28/2019, 07/01/2020   PNEUMOCOCCAL CONJUGATE-20 06/25/2021   Pneumococcal Polysaccharide-23 02/13/2020   Tdap  10/20/2010, 02/08/2019   Unspecified SARS-COV-2 Vaccination 04/03/2019, 05/01/2019, 11/15/2022   Zoster Recombinant(Shingrix) 02/08/2019, 06/06/2019   Zoster, Live 02/08/2019, 06/06/2019    Screening Tests Health Maintenance  Topic Date Due   COVID-19 Vaccine (9 - 2024-25 season) 05/16/2023   OPHTHALMOLOGY EXAM  06/24/2023   INFLUENZA VACCINE  08/26/2023   HEMOGLOBIN A1C  11/22/2023   Diabetic kidney evaluation - eGFR measurement  05/22/2024   Diabetic kidney evaluation - Urine ACR  05/22/2024   FOOT EXAM  05/22/2024   Medicare Annual Wellness (AWV)  07/05/2024   MAMMOGRAM  02/08/2025   DTaP/Tdap/Td (3 - Td or Tdap) 02/07/2029   Colonoscopy  01/09/2030   Pneumonia Vaccine 39+ Years old  Completed   DEXA SCAN  Completed   Hepatitis C Screening  Completed   Zoster Vaccines- Shingrix  Completed   HPV VACCINES  Aged Out   Meningococcal B Vaccine  Aged Out    Health Maintenance  Health Maintenance Due  Topic Date Due   COVID-19 Vaccine (9 - 2024-25 season) 05/16/2023   OPHTHALMOLOGY EXAM  06/24/2023   Health Maintenance Items Addressed: Has eye exam tomorrow.  Additional Screening:  Vision Screening: Recommended annual ophthalmology exams for early detection of glaucoma and other disorders of the eye. Would you like a referral to an eye doctor? No    Dental Screening: Recommended annual dental exams for proper oral hygiene  Community Resource Referral / Chronic Care Management: CRR required this visit?  No   CCM required this visit?  No   Plan:    I have personally reviewed and noted the following in the patient's chart:   Medical and social history Use of alcohol, tobacco or illicit drugs  Current medications and supplements including opioid prescriptions. Patient is not currently taking opioid prescriptions. Functional ability and status Nutritional status Physical activity Advanced directives List of other physicians Hospitalizations, surgeries, and ER  visits in previous 12 months Vitals Screenings to include cognitive, depression, and falls Referrals and appointments  In addition, I have reviewed and discussed with patient certain preventive protocols, quality metrics, and best practice recommendations. A written personalized care plan for preventive services as well as general preventive health recommendations were provided to patient.   Areatha Beecham, LPN   [id number]/[id number]   After Visit Summary: (In Person-Printed) AVS printed and given to the patient  Notes: Nothing significant to report at this time.

## 2023-07-07 DIAGNOSIS — H40021 Open angle with borderline findings, high risk, right eye: Secondary | ICD-10-CM | POA: Diagnosis not present

## 2023-07-07 DIAGNOSIS — H524 Presbyopia: Secondary | ICD-10-CM | POA: Diagnosis not present

## 2023-07-07 LAB — HM DIABETES EYE EXAM

## 2023-09-15 ENCOUNTER — Telehealth: Payer: Self-pay

## 2023-09-15 NOTE — Telephone Encounter (Signed)
 Filled out refill order form for Ozempic  (Novo Nordisk) and faxed provider for review and signature.

## 2023-09-21 ENCOUNTER — Encounter: Payer: Self-pay | Admitting: Podiatry

## 2023-09-21 ENCOUNTER — Ambulatory Visit: Admitting: Podiatry

## 2023-09-21 DIAGNOSIS — L84 Corns and callosities: Secondary | ICD-10-CM

## 2023-09-21 DIAGNOSIS — M79674 Pain in right toe(s): Secondary | ICD-10-CM | POA: Diagnosis not present

## 2023-09-21 DIAGNOSIS — E1165 Type 2 diabetes mellitus with hyperglycemia: Secondary | ICD-10-CM | POA: Diagnosis not present

## 2023-09-21 DIAGNOSIS — M79675 Pain in left toe(s): Secondary | ICD-10-CM | POA: Diagnosis not present

## 2023-09-21 DIAGNOSIS — B351 Tinea unguium: Secondary | ICD-10-CM | POA: Diagnosis not present

## 2023-09-22 ENCOUNTER — Ambulatory Visit (INDEPENDENT_AMBULATORY_CARE_PROVIDER_SITE_OTHER): Admitting: Internal Medicine

## 2023-09-22 ENCOUNTER — Encounter: Payer: Self-pay | Admitting: Internal Medicine

## 2023-09-22 VITALS — BP 110/78 | HR 70 | Temp 98.2°F | Ht 62.0 in | Wt 144.6 lb

## 2023-09-22 DIAGNOSIS — R911 Solitary pulmonary nodule: Secondary | ICD-10-CM

## 2023-09-22 DIAGNOSIS — E785 Hyperlipidemia, unspecified: Secondary | ICD-10-CM

## 2023-09-22 DIAGNOSIS — I2510 Atherosclerotic heart disease of native coronary artery without angina pectoris: Secondary | ICD-10-CM

## 2023-09-22 DIAGNOSIS — I119 Hypertensive heart disease without heart failure: Secondary | ICD-10-CM

## 2023-09-22 DIAGNOSIS — E1169 Type 2 diabetes mellitus with other specified complication: Secondary | ICD-10-CM

## 2023-09-22 NOTE — Assessment & Plan Note (Signed)
 Coronary calcium  score of 31.7. This was 70th percentile for age-, race-, and sex-matched controls. She has been evaluated by Cardiology.  Continue with atorvastatin .

## 2023-09-22 NOTE — Assessment & Plan Note (Signed)
 Diabetes well-controlled, no significant hypoglycemia. Concern for weight loss possibly due to past surgery and medication. - Provide glucometer for home use. - Instruct to check blood glucose twice a week and more frequently if symptomatic. - Monitor A1c levels. - Continue Ozempic  0.5 mg. Consider lowering the dose in the future - Optimize protein intake and strength training.

## 2023-09-22 NOTE — Assessment & Plan Note (Signed)
 Chronic, well controlled. She will continue with losartan and metoprolol.  Encouraged to follow low sodium diet.

## 2023-09-22 NOTE — Patient Instructions (Addendum)
 [facility] [address] [address] [employer]  Type 2 Diabetes Mellitus, Diagnosis, Adult Type 2 diabetes (type 2 diabetes mellitus) is a long-term (chronic) disease. It may happen when there is one or both of these problems: The pancreas does not make enough insulin. The body does not react in a normal way to insulin that it makes. Insulin lets sugars go into cells in your body. If you have type 2 diabetes, sugars cannot get into your cells. Sugars build up in the blood. This causes high blood sugar. What are the causes? The exact cause of this condition is not known. What increases the risk? Having type 2 diabetes in your family. Being overweight or very overweight. Not being active. Your body not reacting in a normal way to the insulin it makes. Having higher than normal blood sugar over time. Having a type of diabetes when you were pregnant. Having a condition that causes small fluid-filled sacs on your ovaries. What are the signs or symptoms? At first, you may have no symptoms. You will get symptoms slowly. They may include: More thirst than normal. More hunger than normal. Needing to pee more than normal. Losing weight without trying. Feeling tired. Feeling weak. Seeing things blurry. Dark patches on your skin. How is this treated? This condition may be treated by a diabetes expert. You may need to: Follow an eating plan made by a food expert (dietitian). Get regular exercise. Find ways to deal with stress. Check blood sugar as often as told. Take medicines. Your doctor will set treatment goals for you. Your blood sugar should be at these levels: Before meals: 80-130 mg/dL (4.4-7.2 mmol/L). After meals: below 180 mg/dL (10 mmol/L). Over the last 2-3 months: less than 7%. Follow these instructions at home: Medicines Take your diabetes medicines or insulin every day. Take medicines as told to help you prevent other problems caused by this condition. You may  need: Aspirin. Medicine to lower cholesterol. Medicine to control blood pressure. Questions to ask your doctor Should I meet with a diabetes educator? What medicines do I need, and when should I take them? What will I need to treat my condition at home? When should I check my blood sugar? Where can I find a support group? Who can I call if I have questions? When is my next doctor visit? General instructions Take over-the-counter and prescription medicines only as told by your doctor. Keep all follow-up visits. Where to find more information For help and guidance and more information about diabetes, please go to: American Diabetes Association (ADA): www.diabetes.org American Association of Diabetes Care and Education Specialists (ADCES): www.diabeteseducator.org International Diabetes Federation (IDF): DCOnly.dk Contact a doctor if: Your blood sugar is at or above 240 mg/dL (86.6 mmol/L) for 2 days in a row. You have been sick for 2 days or more, and you are not getting better. You have had a fever for 2 days or more, and you are not getting better. You have any of these problems for more than 6 hours: You cannot eat or drink. You feel like you may vomit. You vomit. You have watery poop (diarrhea). Get help right away if: Your blood sugar is lower than 54 mg/dL (3 mmol/L). You feel mixed up (confused). You have trouble thinking clearly. You have trouble breathing. You have medium or large ketone levels in your pee. These symptoms may be an emergency. Get help right away. Call your local emergency services (911 in the [place] Do not wait to see  if the symptoms will go away. Do not drive yourself to the hospital. Summary Type 2 diabetes is a long-term disease. Your pancreas may not make enough insulin, or your body may not react in a normal way to insulin that it makes. This condition is treated with an eating plan, lifestyle changes, and medicines. Your doctor will set  treatment goals for you. These will help you keep your blood sugar in a healthy range. Keep all follow-up visits. This information is not intended to replace advice given to you by your health care provider. Make sure you discuss any questions you have with your health care provider. Document Revised: 04/07/2020 Document Reviewed: 04/07/2020 Elsevier Patient Education  2024 [employer].

## 2023-09-22 NOTE — Assessment & Plan Note (Signed)
 Solitary pulmonary nodule identified. Follow-up CT warranted due to family history of lung cancer. - Order CT of the chest to evaluate the solitary pulmonary nodule.

## 2023-09-22 NOTE — Progress Notes (Addendum)
 I,Chelsea Landry, CMA,acting as a Neurosurgeon for Chelsea LOISE Slocumb, MD.,have documented all relevant documentation on the behalf of Chelsea LOISE Slocumb, MD,as directed by  Chelsea LOISE Slocumb, MD while in the presence of Chelsea LOISE Slocumb, MD.  Subjective:  Patient ID: Chelsea Landry , female    DOB: [birth year] , 68 y.o.   MRN: [id number]  Chief Complaint  Patient presents with   Diabetes    Patient presents today for dm & bpc. She reports compliance with medications. Denies headache, chest pain & sob.    Hypertension    HPI Discussed the use of AI scribe software for clinical note transcription with the patient, who gave verbal consent to proceed.  History of Present Illness Chelsea Landry is a 68 year old female with diabetes and hypertension who presents for routine follow-up.  She manages her diabetes using her husband's glucometer, checking her blood sugar when experiencing symptoms such as lightheadedness, dizziness, or shakiness. She reports no recent episodes of abnormal blood sugar readings. Her current medications include atorvastatin  20 mg, losartan  50 mg, and Ozempic  0.5 mg, which she receives from the company. She recently picked up her medication and does not require a refill at this time.  She has a history of weight loss surgery, which influences her eating habits. She currently weighs 144 lbs, down from her goal of 150 lbs, and notes early satiety, feeling full after consuming half a hamburger.  Her family history is significant for lung cancer, as her sister, a non-smoker, died from the disease. She DOES NOT have a history of smoking.   Diabetes She presents for her follow-up diabetic visit. She has type 2 diabetes mellitus. Her disease course has been stable. There are no hypoglycemic associated symptoms. There are no diabetic associated symptoms. Pertinent negatives for diabetes include no blurred vision and no chest pain. Symptoms are stable. There are no diabetic complications.  Risk factors for coronary artery disease include diabetes mellitus, dyslipidemia, hypertension and post-menopausal. She is compliant with treatment most of the time.  Hypertension This is a chronic problem. The current episode started more than 1 year ago. The problem has been gradually improving since onset. Pertinent negatives include no blurred vision, chest pain, orthopnea, palpitations or shortness of breath. Past treatments include angiotensin blockers and beta blockers. The current treatment provides moderate improvement.     Past Medical History:  Diagnosis Date   Caregiver stress 12/20/2022   Fibroids    Frequency of urination    Hypertension    Rash    RT LEG   SVD (spontaneous vaginal delivery)    x 2   Vaginal lesion      Family History  Problem Relation Age of Onset   Acute lymphoblastic leukemia Mother 12   Aneurysm Father        brain   Anuerysm Father    Breast cancer Sister 78 - 79   Lung cancer Sister 19       non-smoker   Brain cancer Maternal Aunt    Breast cancer Paternal Aunt 24 - 24   Brain cancer Brother 40   Hypertension Other      Current Outpatient Medications:    atorvastatin  (LIPITOR) 20 MG tablet, TAKE ONCE DAILY. MON-SAT SKIP SUNDAYS., Disp: 90 tablet, Rfl: 1   Cholecalciferol (VITAMIN D3) 50 MCG (2000 UT) capsule, Take 2,000 Units by mouth daily., Disp: , Rfl:    Insulin Pen Needle (NOVOFINE PEN NEEDLE) 32G X 6 MM MISC, USE  ONCE DAILY AS NEEDED., Disp: 100 each, Rfl: 1   KRILL OIL PO, Take 1 capsule by mouth daily., Disp: , Rfl:    losartan  (COZAAR ) 50 MG tablet, Take 1 tablet (50 mg total) by mouth daily., Disp: 90 tablet, Rfl: 2   Multiple Vitamin (MULTIVITAMIN WITH MINERALS) TABS tablet, Take 1 tablet by mouth daily., Disp: , Rfl:    Semaglutide ,0.25 or 0.5MG /DOS, (OZEMPIC , 0.25 OR 0.5 MG/DOSE,) 2 MG/3ML SOPN, INJECT 0.50 MG INTO THE SKIN WEEKLY, Disp: 9 mL, Rfl: 1   No Known Allergies   Review of Systems  Constitutional: Negative.    Eyes:  Negative for blurred vision.  Respiratory: Negative.  Negative for shortness of breath.   Cardiovascular: Negative.  Negative for chest pain, palpitations and orthopnea.  Gastrointestinal: Negative.   Neurological: Negative.   Psychiatric/Behavioral: Negative.       Today's Vitals   09/22/23 1431  BP: 110/78  Pulse: 70  Temp: 98.2 F (36.8 C)  SpO2: 98%  Weight: 144 lb 9.6 oz (65.6 kg)  Height: 5' 2 (1.575 m)   Body mass index is 26.45 kg/m.  Wt Readings from Last 3 Encounters:  09/22/23 144 lb 9.6 oz (65.6 kg)  07/06/23 150 lb (68 kg)  06/14/23 150 lb (68 kg)     Objective:  Physical Exam Vitals and nursing note reviewed.  Constitutional:      Appearance: Normal appearance.  HENT:     Head: Normocephalic and atraumatic.  Eyes:     Extraocular Movements: Extraocular movements intact.  Cardiovascular:     Rate and Rhythm: Normal rate and regular rhythm.     Heart sounds: Normal heart sounds.  Pulmonary:     Effort: Pulmonary effort is normal.     Breath sounds: Normal breath sounds.  Musculoskeletal:     Cervical back: Normal range of motion.  Skin:    General: Skin is warm.  Neurological:     General: No focal deficit present.     Mental Status: She is alert.  Psychiatric:        Mood and Affect: Mood normal.        Behavior: Behavior normal.         Assessment And Plan:  Dyslipidemia associated with type 2 diabetes mellitus (HCC) Assessment & Plan: Diabetes well-controlled, no significant hypoglycemia. Concern for weight loss possibly due to past surgery and medication. - Provide glucometer for home use. - Instruct to check blood glucose twice a week and more frequently if symptomatic. - Monitor A1c levels. - Continue Ozempic  0.5 mg. Consider lowering the dose in the future - Optimize protein intake and strength training.  Orders: -     CMP14+EGFR -     Hemoglobin A1c -     TSH  Hypertensive heart disease without heart  failure Assessment & Plan: Chronic, well controlled.  She will continue with losartan  and metoprolol .  Encouraged to follow low sodium diet.   Orders: -     CMP14+EGFR  Coronary artery calcification seen on CAT scan Assessment & Plan: Coronary calcium  score of 31.7. This was 70th percentile for age-, race-, and sex-matched controls. She has been evaluated by Cardiology.  Continue with atorvastatin .   Lung nodule seen on imaging study Assessment & Plan: Solitary pulmonary nodule identified. Follow-up CT warranted due to family history of lung cancer. - Order CT of the chest to evaluate the solitary pulmonary nodule.  Orders: -     CT CHEST WO CONTRAST; Future   Return  in 3 weeks (on 10/13/2023), or [place] flu shot, for ., May 2026 physical, 4 month DM (January 2026).  Patient was given opportunity to ask questions. Patient verbalized understanding of the plan and was able to repeat key elements of the plan. All questions were answered to their satisfaction.   I, Chelsea LOISE Slocumb, MD, have reviewed all documentation for this visit. The documentation on 09/22/23 for the exam, diagnosis, procedures, and orders are all accurate and complete.   IF YOU HAVE BEEN REFERRED TO A SPECIALIST, IT MAY TAKE 1-2 WEEKS TO SCHEDULE/PROCESS THE REFERRAL. IF YOU HAVE NOT HEARD FROM US /SPECIALIST IN TWO WEEKS, PLEASE GIVE US  A CALL AT [phone].   THE PATIENT IS ENCOURAGED TO PRACTICE SOCIAL DISTANCING DUE TO THE COVID-19 PANDEMIC.

## 2023-09-23 ENCOUNTER — Ambulatory Visit: Payer: Self-pay | Admitting: Internal Medicine

## 2023-09-23 LAB — CMP14+EGFR
ALT: 31 [IU]/L (ref 0–32)
AST: 27 [IU]/L (ref 0–40)
Albumin: 4.2 g/dL (ref 3.9–4.9)
Alkaline Phosphatase: 62 [IU]/L (ref 44–121)
BUN/Creatinine Ratio: 16 (ref 12–28)
BUN: 15 mg/dL (ref 8–27)
Bilirubin Total: 0.5 mg/dL (ref 0.0–1.2)
CO2: 25 mmol/L (ref 20–29)
Calcium: 9.7 mg/dL (ref 8.7–10.3)
Chloride: 103 mmol/L (ref 96–106)
Creatinine, Ser: 0.93 mg/dL (ref 0.57–1.00)
Globulin, Total: 2.3 g/dL (ref 1.5–4.5)
Glucose: 92 mg/dL (ref 70–99)
Potassium: 4.5 mmol/L (ref 3.5–5.2)
Sodium: 142 mmol/L (ref 134–144)
Total Protein: 6.5 g/dL (ref 6.0–8.5)
eGFR: 67 mL/min/{1.73_m2}

## 2023-09-23 LAB — HEMOGLOBIN A1C
Est. average glucose Bld gHb Est-mCnc: 123 mg/dL
Hgb A1c MFr Bld: 5.9 % — ABNORMAL HIGH (ref 4.8–5.6)

## 2023-09-23 LAB — TSH: TSH: 1.51 u[IU]/mL (ref 0.450–4.500)

## 2023-09-24 NOTE — Progress Notes (Signed)
  Subjective:  Patient ID: Chelsea Landry, female    DOB: 04-19-1955,  MRN: [id number]  STANLEY HELMUTH presents to clinic today for callus(es) right lower extremity and painful thick toenails that are difficult to trim. Painful toenails interfere with ambulation. Aggravating factors include wearing enclosed shoe gear. Pain is relieved with periodic professional debridement. Painful calluses are aggravated when weightbearing with and without shoegear. Pain is relieved with periodic professional debridement. Patient states she has been having ankle pain LLE as well as questions about her bunion of the LLE. Chief Complaint  Patient presents with   Diabetes    [facility] Diet control diabetes. A1C 5.9. Toenail trim. LOV with PCP 08/22/23.   PCP is Jarold Medici, MD.  No Known Allergies  Review of Systems: Negative except as noted in the HPI.  Objective: There were no vitals filed for this visit. Chelsea Landry is a pleasant 68 y.o. female WD, WN in NAD. AAO x 3.  Vascular Examination: Capillary refill time immediate b/l. Vascular status intact b/l with palpable pedal pulses. Pedal hair present b/l. No pain with calf compression b/l. Skin temperature gradient WNL b/l. No cyanosis or clubbing b/l. No ischemia or gangrene noted b/l.   Neurological Examination: Sensation grossly intact b/l with 10 gram monofilament. Vibratory sensation intact b/l.   Dermatological Examination: Pedal skin with normal turgor, texture and tone b/l.  No open wounds. No interdigital macerations.   Toenails left great toe/l thick, discolored, elongated with subungual debris and pain on dorsal palpation.   Hyperkeratotic lesion(s) submet head 3 right foot and submet head 4 right foot and dorsal PIPJ right 5th toe.  No erythema, no edema, no drainage, no fluctuance.  Musculoskeletal Examination: Muscle strength 5/5 to all lower extremity muscle groups bilaterally. Pain on palpation medial ankle along PTT. HAV with bunion  LLE. Adductovarus deformity left fifth digit and right fifth digit.  Radiographs: None  Assessment/Plan: 1. Pain due to onychomycosis of toenails of both feet   2. Corns and callosities   3. Type 2 diabetes mellitus with hyperglycemia, without long-term current use of insulin [facility])   Consent given for treatment. Patient examined.All patient's and/or POA's questions/concerns addressed on today's visit. Will refer to Dr. Silva for left ankle pain and left bunion deformity. Toenails 1-5 debrided in length and girth without incident. Corn right 5th digit PIPJ and callus(es) submet head 3 right foot and submet head 4 right foot pared with sharp debridement without incident. Continue foot and shoe inspections daily. Monitor blood glucose per PCP/Endocrinologist's recommendations.Continue soft, supportive shoe gear daily. Report any pedal injuries to medical professional. Call office if there are any questions/concerns.  Return in about 3 months (around 12/22/2023).  Delon LITTIE Merlin, DPM      [facility] LOCATION: [address], [place] [address]                   Office [phone]   [facility] LOCATION: [address] [place], [place] [address] Office [phone]

## 2023-09-27 ENCOUNTER — Encounter: Payer: Self-pay | Admitting: Internal Medicine

## 2023-09-28 ENCOUNTER — Encounter: Payer: Self-pay | Admitting: Internal Medicine

## 2023-09-29 ENCOUNTER — Ambulatory Visit
Admission: RE | Admit: 2023-09-29 | Discharge: 2023-09-29 | Disposition: A | Discharge status: Home / Self Care | Source: Ambulatory Visit | Attending: Internal Medicine | Admitting: Internal Medicine

## 2023-09-29 DIAGNOSIS — J984 Other disorders of lung: Secondary | ICD-10-CM | POA: Diagnosis not present

## 2023-09-29 DIAGNOSIS — R911 Solitary pulmonary nodule: Secondary | ICD-10-CM

## 2023-10-10 ENCOUNTER — Other Ambulatory Visit: Payer: Self-pay

## 2023-10-10 DIAGNOSIS — E1169 Type 2 diabetes mellitus with other specified complication: Secondary | ICD-10-CM

## 2023-10-10 MED ORDER — ATORVASTATIN CALCIUM 20 MG PO TABS: ORAL_TABLET | ORAL | 1 refills | Status: DC

## 2023-10-11 ENCOUNTER — Ambulatory Visit (INDEPENDENT_AMBULATORY_CARE_PROVIDER_SITE_OTHER)

## 2023-10-11 ENCOUNTER — Ambulatory Visit: Admitting: Podiatry

## 2023-10-11 VITALS — BP 120/80 | HR 78 | Temp 98.7°F | Ht 62.0 in | Wt 144.0 lb

## 2023-10-11 VITALS — Ht 62.0 in | Wt 144.0 lb

## 2023-10-11 DIAGNOSIS — Z23 Encounter for immunization: Secondary | ICD-10-CM | POA: Diagnosis not present

## 2023-10-11 DIAGNOSIS — Q742 Other congenital malformations of lower limb(s), including pelvic girdle: Secondary | ICD-10-CM | POA: Diagnosis not present

## 2023-10-11 DIAGNOSIS — M2141 Flat foot [pes planus] (acquired), right foot: Secondary | ICD-10-CM | POA: Diagnosis not present

## 2023-10-11 DIAGNOSIS — M79672 Pain in left foot: Secondary | ICD-10-CM

## 2023-10-11 DIAGNOSIS — M7752 Other enthesopathy of left foot: Secondary | ICD-10-CM | POA: Diagnosis not present

## 2023-10-11 DIAGNOSIS — M76822 Posterior tibial tendinitis, left leg: Secondary | ICD-10-CM

## 2023-10-11 DIAGNOSIS — M2142 Flat foot [pes planus] (acquired), left foot: Secondary | ICD-10-CM | POA: Diagnosis not present

## 2023-10-11 NOTE — Progress Notes (Unsigned)
 Subjective:  Patient ID: Chelsea Landry, female    DOB: Dec 07, 1955,  MRN: [id number]  Chief Complaint  Patient presents with   Foot Pain    Rm 1 Patient is here for left foot pain. Pain located in ankle radiates towards arch. Pain described as dull achy. Pain present for x two weeks.    Discussed the use of AI scribe software for clinical note transcription with the patient, who gave verbal consent to proceed.  History of Present Illness Chelsea Landry is a 68 year old female who presents with foot pain.  She experiences soreness in her foot, particularly along the arch area, and notes that her arch is 'pretty much gone.' She is seeking non-surgical options to address this issue. She recalls being fitted for orthotics in 2008, which she stopped using for reasons she cannot remember. She has been wearing supportive shoes but finds that certain shoes, like 'Sunday shoes,' do not provide adequate support.  The pain is more pronounced in a specific area of her foot. No pain when pressing down or pulling back on her foot. She has been using Voltaren  gel for pain relief and is unable to take NSAIDs, opting for Tylenol  instead.  She has concerns about her toe, which she describes as leaning to the left. She has been using toe spacers to prevent further deviation and wears wide, comfortable shoes to accommodate her foot structure. Her bunion does not cause pain.  She discusses her shoe size, noting variability depending on the brand, and mentions discomfort with certain sizes, particularly feeling that her toe rubs in larger shoes.      Objective:    Physical Exam VASCULAR: DP and PT pulse palpable. Foot is warm and well-perfused. Capillary fill time is brisk. DERMATOLOGIC: Normal skin turgor, texture, and temperature. No open lesions, rashes, or ulcerations. NEUROLOGIC: Normal sensation to light touch and pressure. No paresthesias. ORTHOPEDIC: Pes planus deformity with collapse of the  medial arch on weight bearing, valgus positioning, and abduction of the hallux. 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 Smooth pain-free range of motion of all examined joints. No ecchymosis or bruising. No gross deformity. No pain to palpation.       Results RADIOLOGY Left foot X-ray: Accessory navicular bone, forefoot abduction, pes planus, mild hallux interphalangeus deformity without hallux valgus (10/11/2023)   Assessment:   1. Posterior tibial tendon dysfunction (PTTD) of left lower extremity   2. Pes planus of both feet   3. 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 The condition is causing pain along the medial aspect of the foot. X-rays confirm the presence of the accessory navicular bone and pes planus. - Prescribe a lace-up ankle brace to support the tendon and ankle. - Refer to physical therapy for stretching and strengthening exercises to support the medial arch. - Recover and refurbish existing orthotics to improve arch support and comfort. Refurbishment costs approximately ninety dollars and should last five to six years. - Advise removal of existing shoe insoles when using orthotics  for better support. - Recommend use of Voltaren  gel three to four times a day for pain management. - Provide exercises to start at home before physical therapy. - Instruct to use Tylenol  for pain if needed, as NSAIDs are  contraindicated.  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 The bunion is not causing pain or significant discomfort. - Advise use of toe spacers to prevent further deviation of the toe. - Recommend wearing wide, comfortable shoes with a roomy toe box to accommodate the deformity. - Limit use of tight or pointed shoes to reduce pressure on the toe.      Return in about 2 months (around 12/11/2023).

## 2023-10-11 NOTE — Progress Notes (Signed)
 Patient is in office today for a nurse visit for FLU Immunization. Patient Injection was given in the  Left deltoid. Patient tolerated injection well.

## 2023-10-12 ENCOUNTER — Encounter: Payer: Self-pay | Admitting: Podiatry

## 2023-10-12 ENCOUNTER — Other Ambulatory Visit: Payer: Self-pay

## 2023-10-12 DIAGNOSIS — E1169 Type 2 diabetes mellitus with other specified complication: Secondary | ICD-10-CM

## 2023-10-12 MED ORDER — ATORVASTATIN CALCIUM 20 MG PO TABS
ORAL_TABLET | ORAL | 3 refills | Status: AC
Start: 2023-10-12 — End: ?

## 2023-10-14 ENCOUNTER — Ambulatory Visit: Attending: Podiatry | Admitting: Physical Therapy

## 2023-10-14 ENCOUNTER — Encounter: Payer: Self-pay | Admitting: Physical Therapy

## 2023-10-14 ENCOUNTER — Other Ambulatory Visit: Payer: Self-pay

## 2023-10-14 DIAGNOSIS — M2141 Flat foot [pes planus] (acquired), right foot: Secondary | ICD-10-CM | POA: Insufficient documentation

## 2023-10-14 DIAGNOSIS — M79672 Pain in left foot: Secondary | ICD-10-CM | POA: Diagnosis not present

## 2023-10-14 DIAGNOSIS — M6281 Muscle weakness (generalized): Secondary | ICD-10-CM | POA: Insufficient documentation

## 2023-10-14 DIAGNOSIS — M2142 Flat foot [pes planus] (acquired), left foot: Secondary | ICD-10-CM | POA: Insufficient documentation

## 2023-10-14 DIAGNOSIS — Q742 Other congenital malformations of lower limb(s), including pelvic girdle: Secondary | ICD-10-CM | POA: Diagnosis not present

## 2023-10-14 DIAGNOSIS — M76822 Posterior tibial tendinitis, left leg: Secondary | ICD-10-CM | POA: Diagnosis not present

## 2023-10-14 DIAGNOSIS — M25572 Pain in left ankle and joints of left foot: Secondary | ICD-10-CM | POA: Diagnosis not present

## 2023-10-14 NOTE — Therapy (Signed)
 OUTPATIENT PHYSICAL THERAPY LOWER EXTREMITY EVALUATION   Patient Name: Chelsea Landry MRN: [id number] DOB:[birth year], 68 y.o., female Today's Date: 10/14/2023  END OF SESSION:  PT End of Session - 10/14/23 1022     Visit Number 1    Date for Recertification  12/09/23    Authorization Type [employer] will submit auth    PT Start Time 1019    PT Stop Time 1100    PT Time Calculation (min) 41 min    Activity Tolerance Patient tolerated treatment well          Past Medical History:  Diagnosis Date   Caregiver stress 12/20/2022   Fibroids    Frequency of urination    Hypertension    Rash    RT LEG   SVD (spontaneous vaginal delivery)    x 2   Vaginal lesion    Past Surgical History:  Procedure Laterality Date   BREAST BIOPSY Right 05/13/2022   US  RT BREAST BX W LOC DEV 1ST LESION IMG BX SPEC US  GUIDE 05/13/2022 [facility]   BREATH TEK H PYLORI N/A 04/17/2013   Procedure: BREATH TEK H PYLORI;  Surgeon: Donnice KATHEE Lunger, MD;  Location: [facility];  Service: General;  Laterality: N/A;   CHOLECYSTECTOMY  01/26/1999   COLONOSCOPY  01/26/2004   DILATATION & CURETTAGE/HYSTEROSCOPY WITH MYOSURE N/A 12/27/2013   Procedure: DILATATION & CURETTAGE/HYSTEROSCOPY WITH MYOSURE;  Surgeon: Ovid All, MD;  Location: [facility];  Service: Gynecology;  Laterality: N/A;   HYSTEROSCOPY     LAPAROSCOPIC GASTRIC SLEEVE RESECTION N/A 12/31/2013   Procedure: LAPAROSCOPIC GASTRIC SLEEVE RESECTION w/Upper GI/gen and hiatal hernia repair;  Surgeon: Donnice KATHEE Lunger, MD;  Location: [facility];  Service: General;  Laterality: N/A;   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   Patient Active Problem List   Diagnosis Date Noted   Lung nodule seen on imaging study 09/22/2023   Allergic rhinitis with postnasal drip 05/29/2023   Abrasion of abdominal wall 05/28/2023   Encounter for annual physical exam 05/28/2023   Caregiver stress 12/20/2022   Hypertensive heart disease without heart  failure 12/06/2022   Overweight with body mass index (BMI) of 28 to 28.9 in adult 12/06/2022   Vitamin D  deficiency 10/13/2022   Immunization due 10/13/2022   Estrogen deficiency 10/13/2022   Coronary artery calcification seen on CAT scan 08/24/2022   Dyslipidemia associated with type 2 diabetes mellitus (HCC) 08/24/2022   Encounter for HCV screening test for low risk patient 08/24/2022   OSA (obstructive sleep apnea) 08/24/2022   History of colonic polyps 08/24/2022   Genetic testing 07/26/2022   Essential hypertension, benign 05/23/2022   Type 2 diabetes mellitus with hyperglycemia, without long-term current use of insulin (HCC) 05/23/2022   Angiomyolipoma of kidney 05/23/2022   Family history of cancer 05/23/2022   Complex endometrial hyperplasia 04/03/2018   Postmenopausal bleeding 04/03/2018   Chondromalacia of left patella 03/06/2018   Pain in left knee 02/09/2018   Cervical radiculopathy 08/23/2017   Pain in upper limb 08/11/2017   Chest pain, rule out acute myocardial infarction 02/03/2016   S/P laparoscopic sleeve gastrectomy Dec 2015 12/31/2013   Lipoma of arm - both forearms - 4 cm, subcutaneous 12/28/2010   Lipoma of back - 3 cm 12/28/2010    PCP: Jarold Medici MD  REFERRING PROVIDER: Silva Cornet DPM  REFERRING DIAG: F23.177 posterior tibial tendon dysfunction left; M21.41, M21.42 pes planus both feet; M79.672, Q74.2 left navicular bone pain  THERAPY DIAG:  Left ankle/foot pain; weakness; difficulty in walking Rationale for Evaluation and Treatment: Rehabilitation  ONSET DATE: 2 months  SUBJECTIVE:   SUBJECTIVE STATEMENT: Wearing ankle support, having new custom orthotics being made; wearing HOKA shoes x 2 months; toe spasm while walking; going to ex class more than walking (only 2 laps walking); water aerobics   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 Yes NPRS scale: not at rest with movement will  pull, with walking 5-6/10 Pain location: medial  Pain orientation: Left and Medial  PAIN TYPE: burning Pain description: intermittent  Aggravating factors: walking a lot= 2 miles Relieving factors: get off feet  PRECAUTIONS: None  WEIGHT BEARING RESTRICTIONS: No  FALLS:  Has patient fallen in last 6 months? Fell yesterday left shoulder, right hip running from the shower to check on her husband    OCCUPATION: retired from [employer]  PLOF: Independent  PATIENT GOALS: get pain go completely away   OBJECTIVE:  Note: Objective measures were completed at Evaluation unless otherwise noted.  DIAGNOSTIC FINDINGS: x-rays  PATIENT SURVEYS:  LEFS  76/80 COGNITION: Overall cognitive status: Within functional limits for tasks assessed     SENSATION: WFL  EDEMA:  none  POSTURE: bil moderate pes planus  PALPATION: No tenderness along posterior tib tendon, anterior tib or gastrocs  LOWER EXTREMITY ROM: decreased left ankle dorsiflexion 2 degrees, limited toe extension particularly great toe extension; plantarflexion, inversion and eversion all WFLS and nonpainful;  decreased calcaneal mobility and metatarsal mobility   LOWER EXTREMITY MMT: decreased intrinsic foot strength, able to activate foot musculature to lift from pronation to neutral in non-weight bearing for 3-5 sec;  ankle strength grossly 4/5  GAIT: Comments: lack of toe extension during push off but also wearing ankle support brace                                                                                                                                TREATMENT DATE: 10/14/23 evaluation  Initial HEP   PATIENT EDUCATION:  Education details: Educated patient on anatomy and physiology of current symptoms, prognosis, plan of care as well as initial self care strategies to promote recovery Person educated: Patient Education method: Explanation Education comprehension: verbalized understanding  HOME EXERCISE  PROGRAM: Access Code: [email or web address] URL: [email or web address] Date: 10/14/2023 Prepared by: Glade Pesa  Exercises - Seated Arch Lifts  - 1 x daily - 7 x weekly - 1 sets - 10 reps - Towel Scrunches  - 1 x daily - 7 x weekly - 1 sets - 10 reps - Seated Calf Stretch with Strap  - 1 x daily - 7 x weekly - 1 sets - 3 reps - 30 hold - Gastroc Stretch on Wall  - 1 x daily - 7 x weekly - 1 sets - 3 reps - 30 hold - Gastroc Stretch with Foot at Wall  - 1 x daily - 7 x weekly -  1 sets - 3 reps - 30 hold  ASSESSMENT:  CLINICAL IMPRESSION: Patient is a 68 y.o. female who was seen today for physical therapy evaluation and treatment for left posterior tibial tendon dysfunction and navicular bone pain related to pes planus.  She reports the onset of medial ankle pain about 2 months ago for no apparent reason.  She is not limited functionally other than affecting her ability to walk longer periods of time (2 miles).  Decreased gastroc length limiting ankle dorsiflexion.  Lack of toe extension affects push off during gait.  Weakness in foot intrinsics as well as postural changes contribute to overpronation in standing but she is able to maintain neutral foot in non weight bearing for short periods of time.  She would benefit from PT to address these deficits and establish a HEP.  OBJECTIVE IMPAIRMENTS: difficulty walking, decreased ROM, decreased strength, hypomobility, and pain.   ACTIVITY LIMITATIONS: locomotion level  PARTICIPATION LIMITATIONS: community activity  PERSONAL FACTORS: caregiver stress are also affecting patient's functional outcome.   REHAB POTENTIAL: Good  CLINICAL DECISION MAKING: Stable/uncomplicated  EVALUATION COMPLEXITY: Low   GOALS: Goals reviewed with patient? Yes  SHORT TERM GOALS: Target date: 11/11/2023   The patient will demonstrate knowledge of basic self care strategies and exercises to promote healing  Baseline: Goal status: INITIAL  2.  The  patient will have improved ankle dorsiflexion to 8 degrees and toe extension to 10 degrees needed for ambulation  Baseline:  Goal status: INITIAL    LONG TERM GOALS: Target date: 12/09/2023   The patient will be independent in a safe self progression of a home exercise program to promote further recovery of function  Baseline:  Goal status: INITIAL  2.  The patient will report a 60% improvement in pain levels with functional activities which are currently difficult including walking longer distances Baseline:  Goal status: INITIAL  3.  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)  PT DURATION: 8 weeks  PLANNED INTERVENTIONS: 97164- PT Re-evaluation, 97750- Physical Performance Testing, 97110-Therapeutic exercises, 97530- Therapeutic activity, 97112- Neuromuscular re-education, 97535- Self Care, 02859- Manual therapy, [phone]- Aquatic Therapy, H9716- Electrical stimulation (unattended), [phone]- Electrical stimulation (manual), [id number]- Vasopneumatic device, [id number]- Ultrasound, [id number]- Ionotophoresis 4mg /ml Dexamethasone , 79439 (1-2 muscles), 20561 (3+ muscles)- Dry Needling, Patient/Family education, Balance training, Taping, Joint mobilization, Cryotherapy, and Moist heat  PLAN FOR NEXT SESSION: review initial HEP and progress; add seated heel raise with ball squeeze, band ex's; try arch lifts in standing  Glade Pesa, PT 10/14/23 12:02 PM Phone: [phone] Fax: [phone]

## 2023-10-28 ENCOUNTER — Ambulatory Visit: Attending: Podiatry | Admitting: Physical Therapy

## 2023-10-28 DIAGNOSIS — M6281 Muscle weakness (generalized): Secondary | ICD-10-CM | POA: Diagnosis not present

## 2023-10-28 DIAGNOSIS — M25572 Pain in left ankle and joints of left foot: Secondary | ICD-10-CM | POA: Diagnosis not present

## 2023-10-28 NOTE — Therapy (Signed)
 OUTPATIENT PHYSICAL THERAPY LOWER EXTREMITY PROGRESS NOTE   Patient Name: Chelsea Landry MRN: [id number] DOB:[birth year], 68 y.o., female Today's Date: 10/28/2023  END OF SESSION:  PT End of Session - 10/28/23 1007     Visit Number 2    Date for Recertification  12/09/23    Authorization Type [employer] cohere 8 visits 9/19-11/14    PT Start Time 1014    PT Stop Time 1055    PT Time Calculation (min) 41 min    Activity Tolerance Patient tolerated treatment well          Past Medical History:  Diagnosis Date   Caregiver stress 12/20/2022   Fibroids    Frequency of urination    Hypertension    Rash    RT LEG   SVD (spontaneous vaginal delivery)    x 2   Vaginal lesion    Past Surgical History:  Procedure Laterality Date   BREAST BIOPSY Right 05/13/2022   US  RT BREAST BX W LOC DEV 1ST LESION IMG BX SPEC US  GUIDE 05/13/2022 [facility]   BREATH TEK H PYLORI N/A 04/17/2013   Procedure: BREATH TEK H PYLORI;  Surgeon: Donnice KATHEE Lunger, MD;  Location: [facility];  Service: General;  Laterality: N/A;   CHOLECYSTECTOMY  01/26/1999   COLONOSCOPY  01/26/2004   DILATATION & CURETTAGE/HYSTEROSCOPY WITH MYOSURE N/A 12/27/2013   Procedure: DILATATION & CURETTAGE/HYSTEROSCOPY WITH MYOSURE;  Surgeon: Ovid All, MD;  Location: [facility];  Service: Gynecology;  Laterality: N/A;   HYSTEROSCOPY     LAPAROSCOPIC GASTRIC SLEEVE RESECTION N/A 12/31/2013   Procedure: LAPAROSCOPIC GASTRIC SLEEVE RESECTION w/Upper GI/gen and hiatal hernia repair;  Surgeon: Donnice KATHEE Lunger, MD;  Location: [facility];  Service: General;  Laterality: N/A;   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   Patient Active Problem List   Diagnosis Date Noted   Lung nodule seen on imaging study 09/22/2023   Allergic rhinitis with postnasal drip 05/29/2023   Abrasion of abdominal wall 05/28/2023   Encounter for annual physical exam 05/28/2023   Caregiver stress 12/20/2022   Hypertensive heart disease without heart  failure 12/06/2022   Overweight with body mass index (BMI) of 28 to 28.9 in adult 12/06/2022   Vitamin D  deficiency 10/13/2022   Immunization due 10/13/2022   Estrogen deficiency 10/13/2022   Coronary artery calcification seen on CAT scan 08/24/2022   Dyslipidemia associated with type 2 diabetes mellitus (HCC) 08/24/2022   Encounter for HCV screening test for low risk patient 08/24/2022   OSA (obstructive sleep apnea) 08/24/2022   History of colonic polyps 08/24/2022   Genetic testing 07/26/2022   Essential hypertension, benign 05/23/2022   Type 2 diabetes mellitus with hyperglycemia, without long-term current use of insulin (HCC) 05/23/2022   Angiomyolipoma of kidney 05/23/2022   Family history of cancer 05/23/2022   Complex endometrial hyperplasia 04/03/2018   Postmenopausal bleeding 04/03/2018   Chondromalacia of left patella 03/06/2018   Pain in left knee 02/09/2018   Cervical radiculopathy 08/23/2017   Pain in upper limb 08/11/2017   Chest pain, rule out acute myocardial infarction 02/03/2016   S/P laparoscopic sleeve gastrectomy Dec 2015 12/31/2013   Lipoma of arm - both forearms - 4 cm, subcutaneous 12/28/2010   Lipoma of back - 3 cm 12/28/2010    PCP: Jarold Medici MD  REFERRING PROVIDER: Silva Cornet DPM  REFERRING DIAG: F23.177 posterior tibial tendon dysfunction left; M21.41, M21.42 pes planus both feet; M79.672, Q74.2 left navicular bone pain  THERAPY DIAG:  Left ankle/foot pain; weakness; difficulty in walking Rationale for Evaluation and Treatment: Rehabilitation  ONSET DATE: 2 months  SUBJECTIVE:   SUBJECTIVE STATEMENT: Going good.  Exercises are easy to do with daily activities. No orthotics yet.    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 Yes NPRS scale: point 5; no issue with walking  Pain location: medial  Pain orientation: Left and Medial  PAIN TYPE: burning Pain description: intermittent  Aggravating factors: walking a lot= 2 miles Relieving factors: get off feet  PRECAUTIONS: None  WEIGHT BEARING RESTRICTIONS: No  FALLS:  Has patient fallen in last 6 months? Fell yesterday left shoulder, right hip running from the shower to check on her husband    OCCUPATION: retired from [employer]  PLOF: Independent  PATIENT GOALS: get pain go completely away   OBJECTIVE:  Note: Objective measures were completed at Evaluation unless otherwise noted.  DIAGNOSTIC FINDINGS: x-rays  PATIENT SURVEYS:  LEFS  76/80 COGNITION: Overall cognitive status: Within functional limits for tasks assessed     SENSATION: WFL  EDEMA:  none  POSTURE: bil moderate pes planus  PALPATION: No tenderness along posterior tib tendon, anterior tib or gastrocs  LOWER EXTREMITY ROM: decreased left ankle dorsiflexion 2 degrees, limited toe extension particularly great toe extension; plantarflexion, inversion and eversion all WFLS and nonpainful;  decreased calcaneal mobility and metatarsal mobility   LOWER EXTREMITY MMT: decreased intrinsic foot strength, able to activate foot musculature to lift from pronation to neutral in non-weight bearing for 3-5 sec;  ankle strength grossly 4/5  GAIT: Comments: lack of toe extension during push off but also wearing ankle support brace  TREATMENT DATE: 10/27/23 Red band ankle eversion 10x (Added to HEP- see below) Red band ankle dorsiflexion 10x (Added to HEP- see below) Red band ankle inversion 10x (Added to HEP- see below) Seated ankle inversion isometric against ball 15x(Added to HEP- see below) Standing arch lifts 10x Standing arch lifts hold 5 sec 10x Standing arch lifts with opposite 4 ways 10x Standing heel raises with small ball squeeze between  heels 15x Discussed progressions of standing ex's with arch lifts Given red band for home use                                                                                                                               TREATMENT DATE: 10/14/23 evaluation  Initial HEP   PATIENT EDUCATION:  Education details: Educated patient on anatomy and physiology of current symptoms, prognosis, plan of care as well as initial self care strategies to promote recovery Person educated: Patient Education method: Explanation Education comprehension: verbalized understanding  HOME EXERCISE PROGRAM: Access Code: [email or web address] URL: [email or web address] Date: 10/28/2023 Prepared by: Glade Pesa  Exercises - Seated Arch Lifts  - 1 x daily - 7 x weekly - 1 sets - 10 reps - Towel Scrunches  - 1 x daily - 7 x weekly - 1 sets - 10 reps - Seated Calf Stretch with Strap  - 1 x daily - 7 x weekly - 1 sets - 3 reps - 30 hold - Gastroc Stretch on Wall  - 1 x daily - 7 x weekly - 1 sets - 3 reps - 30 hold - Gastroc Stretch with Foot at Wall  - 1 x daily - 7 x weekly - 1 sets - 3 reps - 30 hold - Isometric Ankle Inversion  - 1 x daily - 7 x weekly - 1 sets - 10 reps - Seated Ankle Inversion with Anchored Resistance  - 1 x daily - 7 x weekly - 1 sets - 3-5 reps - 10 hold - Seated Ankle Eversion with Anchored Resistance  - 1 x daily - 7 x weekly - 1 sets - 3-5 reps - 10 hold - Seated Ankle Dorsiflexion with Resistance  - 1 x daily - 7 x weekly - 1 sets - 10 reps - Standing Heel Raise  - 1 x daily - 7 x weekly - 2 sets - 10 reps  ASSESSMENT:  CLINICAL IMPRESSION: Daysy demonstrates good carryover with initial HEP.  Therapist progressing and updating HEP for increased intensity and challenge level for further strengthening and functional mobility.   She feels she can carry on with her HEP independently.  She will call if any issues arise and if she has not called back to schedule any further appts will  discharge in November.     Eval: Patient is a 68 y.o. female who was seen today for physical therapy evaluation and treatment for left posterior tibial tendon dysfunction and navicular bone pain related to pes planus.  She reports the onset of medial ankle pain about 2 months ago for no apparent reason.  She is not limited functionally other than affecting her ability to walk longer periods of time (2 miles).  Decreased gastroc length limiting ankle dorsiflexion.  Lack of toe extension affects push off during gait.  Weakness in foot intrinsics as well as postural changes contribute to overpronation in standing but she is able to maintain neutral foot in non weight bearing for short periods of time.  She would benefit from PT to address these deficits and establish a HEP.  OBJECTIVE IMPAIRMENTS: difficulty walking, decreased ROM, decreased strength, hypomobility, and pain.   ACTIVITY LIMITATIONS: locomotion level  PARTICIPATION LIMITATIONS: community activity  PERSONAL FACTORS: caregiver stress are also affecting patient's functional outcome.   REHAB POTENTIAL: Good  CLINICAL DECISION MAKING: Stable/uncomplicated  EVALUATION COMPLEXITY: Low   GOALS: Goals reviewed with patient? Yes  SHORT TERM GOALS: Target date: 11/11/2023   The patient will demonstrate knowledge of basic self care strategies and exercises to promote healing  Baseline: Goal status: met 10/3  2.  The patient will have improved ankle dorsiflexion to 8 degrees and toe extension to 10 degrees needed for ambulation  Baseline:  Goal status:ongoing    LONG TERM GOALS: Target date: 12/09/2023   The patient will be independent in a safe self progression of a home exercise program to promote further recovery of function  Baseline:  Goal status: ongoing  2.  The patient will report a 60% improvement in pain levels with functional activities which are currently difficult including walking longer distances Baseline:   Goal status:  INITIAL  3.  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)  PT DURATION: 8 weeks  PLANNED INTERVENTIONS: 97164- PT Re-evaluation, 97750- Physical Performance Testing, 97110-Therapeutic exercises, 97530- Therapeutic activity, 97112- Neuromuscular re-education, 97535- Self Care, 02859- Manual therapy, [phone]- Aquatic Therapy, H9716- Electrical stimulation (unattended), [phone]- Electrical stimulation (manual), [id number]- Vasopneumatic device, [id number]- Ultrasound, [id number]- Ionotophoresis 4mg /ml Dexamethasone , 79439 (1-2 muscles), 20561 (3+ muscles)- Dry Needling, Patient/Family education, Balance training, Taping, Joint mobilization, Cryotherapy, and Moist heat  PLAN FOR NEXT SESSION:f she has not called back to schedule any further appts will discharge in November.  HEP and progress  Glade Pesa, PT 10/28/23 12:08 PM Phone: [phone] Fax: [phone]

## 2023-11-04 ENCOUNTER — Telehealth: Payer: Self-pay

## 2023-11-04 DIAGNOSIS — M2141 Flat foot [pes planus] (acquired), right foot: Secondary | ICD-10-CM

## 2023-11-04 DIAGNOSIS — M2142 Flat foot [pes planus] (acquired), left foot: Secondary | ICD-10-CM

## 2023-11-04 NOTE — Telephone Encounter (Signed)
 Refurb orthotics are here patient aware $90 charge entered

## 2023-12-12 ENCOUNTER — Ambulatory Visit: Admitting: Podiatry

## 2023-12-23 ENCOUNTER — Telehealth: Payer: Self-pay

## 2023-12-23 NOTE — Telephone Encounter (Signed)
 Completed refill order form for ozempic  and faxed to provider office for review and signature.

## 2023-12-27 ENCOUNTER — Ambulatory Visit: Admitting: Podiatry

## 2023-12-28 ENCOUNTER — Encounter: Payer: Self-pay | Admitting: Pharmacist

## 2023-12-28 NOTE — Progress Notes (Signed)
   12/28/2023  Patient ID: Chelsea Landry, female   DOB: 09-Aug-1955, 68 y.o.   MRN: [id number]  Obtained Provider signature on refill/reorder/change request form for Ozempic  and faxed it to [employer] for scanning and processing.  Called Patient to make sure she understands that Ozempic  wiil not be available through [employer]'s Patient Assistance program next year.  HIPAA identifiers were obtained.  Patient communicated understanding.     Lab Results  Component Value Date   HGBA1C 5.9 (H) 09/22/2023   HGBA1C 5.9 (H) 05/23/2023   HGBA1C 5.9 (H) 12/06/2022    Cassius DOROTHA Brought, PharmD, BCACP Clinical Pharmacist [phone]

## 2024-01-03 ENCOUNTER — Ambulatory Visit: Admitting: Podiatry

## 2024-01-03 DIAGNOSIS — B351 Tinea unguium: Secondary | ICD-10-CM

## 2024-01-03 DIAGNOSIS — E1165 Type 2 diabetes mellitus with hyperglycemia: Secondary | ICD-10-CM

## 2024-01-03 DIAGNOSIS — L84 Corns and callosities: Secondary | ICD-10-CM

## 2024-01-03 DIAGNOSIS — M79675 Pain in left toe(s): Secondary | ICD-10-CM

## 2024-01-03 DIAGNOSIS — M79674 Pain in right toe(s): Secondary | ICD-10-CM

## 2024-01-05 ENCOUNTER — Other Ambulatory Visit: Payer: Self-pay | Admitting: Internal Medicine

## 2024-01-10 ENCOUNTER — Encounter: Payer: Self-pay | Admitting: Podiatry

## 2024-01-10 NOTE — Progress Notes (Signed)
°  Subjective:  Patient ID: Chelsea Landry, female    DOB: [birth year],  MRN: [id number]  Chelsea Landry presents to clinic today for preventative diabetic foot care and callus(es) of both feet and painful mycotic toenails that are difficult to trim. Painful toenails interfere with ambulation. Aggravating factors include wearing enclosed shoe gear. Pain is relieved with periodic professional debridement. Painful calluses are aggravated when weightbearing with and without shoegear. Pain is relieved with periodic professional debridement.  Chief Complaint  Patient presents with   [facility]    RM#15 [facility] 09/02/23 A1C 5.9 BS not checked this am PCP 01/2023 scheduled.   New problem(s): None.   PCP is Jarold Medici, MD.  Allergies[1]  Review of Systems: Negative except as noted in the HPI.  Objective: No changes noted in today's physical examination. There were no vitals filed for this visit. Chelsea Landry is a pleasant 68 y.o. female WD, WN in NAD. AAO x 3.  Vascular Examination: Capillary refill time immediate b/l. Vascular status intact b/l with palpable pedal pulses. Pedal hair present b/l. No pain with calf compression b/l. Skin temperature gradient WNL b/l. No cyanosis or clubbing b/l. No ischemia or gangrene noted b/l.   Neurological Examination: Sensation grossly intact b/l with 10 gram monofilament. Vibratory sensation intact b/l.   Dermatological Examination: Pedal skin with normal turgor, texture and tone b/l.  No open wounds. No interdigital macerations.   Toenails left great toe/l thick, discolored, elongated with subungual debris and pain on dorsal palpation.   Hyperkeratotic lesion(s) submet head 2, 3 b/l. No erythema, no edema, no drainage, no fluctuance.  Musculoskeletal Examination: Muscle strength 5/5 to all lower extremity muscle groups bilaterally. Pain on palpation medial ankle along PTT. HAV with bunion LLE. 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  Radiographs: None  Assessment/Plan: 1. Pain due to onychomycosis of toenails of both feet   2. Callus   3. Type 2 diabetes mellitus with hyperglycemia, without long-term current use of insulin (HCC)   Patient was evaluated and treated. All patient's and/or POA's questions/concerns addressed on today's visit. Toenails 1-5 b/l  debrided in length and girth without incident. Callus(es) submet head 2 b/l, and submet head 3 b/l pared with sharp debridement without incident. Continue daily foot inspections and monitor blood glucose per PCP/Endocrinologist's recommendations Continue soft, supportive shoe gear daily. Report any pedal injuries to medical professional. Call office if there are any questions/concerns.  Return in about 3 months (around 04/02/2024).  Delon LITTIE Merlin, DPM      [facility] LOCATION: [address], [place] [address]                   Office [phone]   [facility] LOCATION: [address] [place], [place] [address] Office [phone]     [1] No Known Allergies

## 2024-01-11 ENCOUNTER — Other Ambulatory Visit: Payer: Self-pay | Admitting: Internal Medicine

## 2024-01-11 DIAGNOSIS — Z1231 Encounter for screening mammogram for malignant neoplasm of breast: Secondary | ICD-10-CM

## 2024-01-13 ENCOUNTER — Encounter: Payer: Self-pay | Admitting: Pharmacist

## 2024-01-13 NOTE — Progress Notes (Signed)
 Patient ID: Chelsea Landry, female   DOB: Mar 25, 1955, 68 y.o.   MRN: [id number]  PLAN:Pharmacy Quality Measure Review  This patient is appearing on a report for being at risk of failing the adherence measure for cholesterol (statin) medications this calendar year.   Medication: Atorvastatin   Last fill date: 12/19/23  for 90  day supply  Insurance report was not up to date. No action needed at this time.   Cassius DOROTHA Brought, PharmD, BCACP Clinical Pharmacist [phone]

## 2024-01-23 ENCOUNTER — Ambulatory Visit: Admitting: Podiatry

## 2024-01-23 VITALS — Ht 62.0 in | Wt 144.0 lb

## 2024-01-23 DIAGNOSIS — M76822 Posterior tibial tendinitis, left leg: Secondary | ICD-10-CM

## 2024-01-25 NOTE — Progress Notes (Signed)
"  °  Subjective:  Patient ID: Chelsea Landry, female    DOB: [birth year],  MRN: [id number]  Chief Complaint  Patient presents with   Foot Pain    Rm 23 Patient is here to f/u on PTTD. Pt states pain has not decreased in left tibial area, she is unable to wear the brace along with orthotics.     Discussed the use of AI scribe software for clinical note transcription with the patient, who gave verbal consent to proceed.  History of Present Illness Chelsea Landry is a 68 year old female who presents with foot pain.  She notes that there is still significant pain there, has not entirely improved.      Objective:    Physical Exam VASCULAR: DP and PT pulse palpable. Foot is warm and well-perfused. Capillary fill time is brisk. DERMATOLOGIC: Normal skin turgor, texture, and temperature. No open lesions, rashes, or ulcerations. NEUROLOGIC: Normal sensation to light touch and pressure. No paresthesias. ORTHOPEDIC: Pes planus deformity with collapse of the medial arch on weight bearing, valgus positioning, and abduction of the hallux. 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  Pain on palpation of posterior tibial tendon at the retromalleolar groove and at the insertion       Results RADIOLOGY Left foot X-ray: Accessory navicular bone, forefoot abduction, pes planus, mild hallux interphalangeus deformity without hallux valgus (10/11/2023)   Assessment:   1. Posterior tibial tendon dysfunction (PTTD) of left lower extremity      Plan:  Patient was evaluated and treated and all questions answered.  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  I recommend an MRI to evaluate for any interstitial tearing within the tendon as well as any abnormalities to help guide further treatment options.  MRI has been ordered I will see her back after the study to reevaluate.        No follow-ups on file.   "

## 2024-01-30 ENCOUNTER — Ambulatory Visit: Payer: Self-pay | Admitting: Internal Medicine

## 2024-01-30 ENCOUNTER — Encounter: Payer: Self-pay | Admitting: Internal Medicine

## 2024-01-30 VITALS — BP 124/76 | HR 67 | Temp 98.1°F | Ht 62.0 in | Wt 153.4 lb

## 2024-01-30 DIAGNOSIS — I2510 Atherosclerotic heart disease of native coronary artery without angina pectoris: Secondary | ICD-10-CM

## 2024-01-30 DIAGNOSIS — E785 Hyperlipidemia, unspecified: Secondary | ICD-10-CM

## 2024-01-30 DIAGNOSIS — Z6828 Body mass index (BMI) 28.0-28.9, adult: Secondary | ICD-10-CM

## 2024-01-30 DIAGNOSIS — R82998 Other abnormal findings in urine: Secondary | ICD-10-CM | POA: Diagnosis not present

## 2024-01-30 DIAGNOSIS — E1169 Type 2 diabetes mellitus with other specified complication: Secondary | ICD-10-CM | POA: Diagnosis not present

## 2024-01-30 DIAGNOSIS — E663 Overweight: Secondary | ICD-10-CM | POA: Diagnosis not present

## 2024-01-30 DIAGNOSIS — I119 Hypertensive heart disease without heart failure: Secondary | ICD-10-CM

## 2024-01-30 LAB — POCT URINALYSIS DIPSTICK
Bilirubin, UA: NEGATIVE
Blood, UA: NEGATIVE
Glucose, UA: NEGATIVE
Ketones, UA: NEGATIVE
Nitrite, UA: NEGATIVE
Protein, UA: NEGATIVE
Spec Grav, UA: 1.025
Urobilinogen, UA: 0.2 U/dL
pH, UA: 6.5

## 2024-01-30 NOTE — Progress Notes (Signed)
 I,[name], CMA,acting as a neurosurgeon for [name], MD.,have documented all relevant documentation on the behalf of [name], MD,as directed by  [name], MD while in the presence of [name], MD.  Subjective:  Patient ID: Chelsea Landry , female    DOB: 11-18-1955 , 69 y.o.   MRN: [id number]  Chief Complaint  Patient presents with   Diabetes    Patient presents today for dm & bpc. She reports compliance with medications. Denies headache, chest pain & sob.   Hypertension    HPI Discussed the use of AI scribe software for clinical note transcription with the patient, who gave verbal consent to proceed.  History of Present Illness Chelsea Landry is a 69 year old female with diabetes and hypertension who presents for management of her conditions.  Her blood sugar levels have been stable, ranging from 105 mg/dL to 882 mg/dL, checked in the morning before eating. She does not check her blood sugar daily and attributes recent fluctuations to the holiday season, specifically mentioning Thanksgiving and Christmas as challenging times for maintaining control. She is currently taking atorvastatin  20 mg daily except on Sundays, losartan  50 mg daily, and Ozempic  0.5 mg weekly. She also takes vitamin D  and Centrum Silver.  She experiences soreness in her left ankle, initially due to pain in the arch area, diagnosed as falling arches. She had orthotics years ago, which were recently revamped, and was given a brace to wear. However, she finds it difficult to wear both the brace and orthotics simultaneously. She feels the orthotics have worsened her condition, causing pain to extend into the ankle. An MRI has been ordered to check for any tears, and she has attended therapy where she was given exercises to perform.  In April, a urinalysis showed traces of leukocytes, but she did not experience any symptoms such as pain, burning, or abnormal sensations. No current urinary  symptoms such as pain, burning, or abnormal sensations.   Diabetes She presents for her follow-up diabetic visit. She has type 2 diabetes mellitus. Her disease course has been stable. There are no hypoglycemic associated symptoms. There are no diabetic associated symptoms. Pertinent negatives for diabetes include no blurred vision and no chest pain. There are no hypoglycemic complications. Symptoms are stable. There are no diabetic complications. Risk factors for coronary artery disease include diabetes mellitus, dyslipidemia, hypertension and post-menopausal. She is compliant with treatment most of the time.  Hypertension This is a chronic problem. The current episode started more than 1 year ago. The problem has been gradually improving since onset. Pertinent negatives include no blurred vision, chest pain, orthopnea, palpitations or shortness of breath. Past treatments include angiotensin blockers and beta blockers. The current treatment provides moderate improvement.     Past Medical History:  Diagnosis Date   Caregiver stress 12/20/2022   Fibroids    Frequency of urination    Hypertension    Rash    RT LEG   SVD (spontaneous vaginal delivery)    x 2   Vaginal lesion      Family History  Problem Relation Age of Onset   Acute lymphoblastic leukemia Mother 67   Aneurysm Father        brain   Anuerysm Father    Breast cancer Sister 65 - 70   Lung cancer Sister 24       non-smoker   Brain cancer Maternal Aunt    Breast cancer Paternal Aunt 14 -  79   Brain cancer Brother 40   Hypertension Other      Current Outpatient Medications:    atorvastatin  (LIPITOR) 20 MG tablet, TAKE ONCE DAILY. MON-SAT SKIP SUNDAYS., Disp: 100 tablet, Rfl: 3   Cholecalciferol (VITAMIN D3) 50 MCG (2000 UT) capsule, Take 2,000 Units by mouth daily., Disp: , Rfl:    Insulin Pen Needle (NOVOFINE PEN NEEDLE) 32G X 6 MM MISC, USE ONCE DAILY AS NEEDED., Disp: 100 each, Rfl: 1   KRILL OIL PO, Take 1 capsule  by mouth daily., Disp: , Rfl:    losartan  (COZAAR ) 50 MG tablet, TAKE 1 TABLET EVERY DAY, Disp: 90 tablet, Rfl: 3   Multiple Vitamin (MULTIVITAMIN WITH MINERALS) TABS tablet, Take 1 tablet by mouth daily., Disp: , Rfl:    Semaglutide ,0.25 or 0.5MG /DOS, (OZEMPIC , 0.25 OR 0.5 MG/DOSE,) 2 MG/3ML SOPN, INJECT 0.50 MG INTO THE SKIN WEEKLY, Disp: 9 mL, Rfl: 1   No Known Allergies   Review of Systems  Constitutional: Negative.   Eyes:  Negative for blurred vision.  Respiratory: Negative.  Negative for shortness of breath.   Cardiovascular: Negative.  Negative for chest pain, palpitations and orthopnea.  Gastrointestinal: Negative.   Neurological: Negative.   Psychiatric/Behavioral: Negative.       Today's Vitals   01/30/24 1437  BP: 124/76  Pulse: 67  Temp: 98.1 F (36.7 C)  SpO2: 98%  Weight: 153 lb 6.4 oz (69.6 kg)  Height: 5' 2 (1.575 m)   Body mass index is 28.06 kg/m.  Wt Readings from Last 3 Encounters:  01/30/24 153 lb 6.4 oz (69.6 kg)  01/23/24 144 lb (65.3 kg)  10/11/23 144 lb (65.3 kg)    The 10-year ASCVD risk score (Arnett DK, et al., 2019) is: 18.3%   Values used to calculate the score:     Age: 63 years     Clinically relevant sex: Female     Is Non-Hispanic African American: Yes     Diabetic: Yes     Tobacco smoker: No     Systolic Blood Pressure: 124 mmHg     Is BP treated: Yes     HDL Cholesterol: 62 mg/dL     Total Cholesterol: 146 mg/dL  Objective:  Physical Exam Vitals and nursing note reviewed.  Constitutional:      Appearance: Normal appearance.  HENT:     Head: Normocephalic and atraumatic.  Eyes:     Extraocular Movements: Extraocular movements intact.  Cardiovascular:     Rate and Rhythm: Normal rate and regular rhythm.     Heart sounds: Normal heart sounds.  Pulmonary:     Effort: Pulmonary effort is normal.     Breath sounds: Normal breath sounds.  Musculoskeletal:     Cervical back: Normal range of motion.  Skin:    General: Skin  is warm.  Neurological:     General: No focal deficit present.     Mental Status: She is alert.  Psychiatric:        Mood and Affect: Mood normal.        Behavior: Behavior normal.         Assessment And Plan:   Assessment & Plan Dyslipidemia associated with type 2 diabetes mellitus (HCC) Blood glucose well-controlled with readings 105-117 mg/dL. Current medications stable. Informed about Ozempic  dosing and storage. - LDL goal is less than 70 - Continue atorvastatin  20 mg daily except Sunday. - Continue losartan  50 mg daily for renal protection. - Continue Ozempic  0.5 mg weekly  as prescribed. - Ensure Ozempic  is stored in the refrigerator and used as directed. 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  She will continue with losartan  and metoprolol .  Encouraged to follow low sodium diet.  Coronary artery calcification seen on CAT scan Coronary calcium  score of 31.7. This was 70th percentile for age-, race-, and sex-matched controls. She has been evaluated by Cardiology.  Continue with atorvastatin . Overweight with body mass index (BMI) of 28 to 28.9 in adult Chronic, encouraged to aim for at least 150 minutes of exercise per week.   Leukocytes in urine I will check urinalysis today. Currently without urinary symptoms.     Orders Placed This Encounter  Procedures   Culture, Urine   Hemoglobin A1c   BMP8+EGFR   POCT Urinalysis Dipstick (81002)   Return for 4 month dm f/u.SABRA  Patient was given opportunity to ask questions. Patient verbalized understanding of the plan and was able to repeat key elements of the plan. All questions were answered to their satisfaction.   I, [name], MD, have reviewed all documentation for this visit. The documentation on 01/30/2024 for the exam, diagnosis, procedures, and orders are all accurate and complete.   IF YOU HAVE BEEN REFERRED TO A SPECIALIST, IT MAY TAKE 1-2 WEEKS TO SCHEDULE/PROCESS THE REFERRAL.  IF YOU HAVE NOT HEARD FROM US /SPECIALIST IN TWO WEEKS, PLEASE GIVE US  A CALL AT [phone].

## 2024-01-30 NOTE — Assessment & Plan Note (Signed)
 Coronary calcium  score of 31.7. This was 70th percentile for age-, race-, and sex-matched controls. She has been evaluated by Cardiology.  Continue with atorvastatin .

## 2024-01-30 NOTE — Patient Instructions (Addendum)
 36 clicks - 0.5mg .   Type 2 Diabetes Mellitus, Diagnosis, Adult Type 2 diabetes (type 2 diabetes mellitus) is a long-term (chronic) disease. It may happen when there is one or both of these problems: The pancreas does not make enough insulin. The body does not react in a normal way to insulin that it makes. Insulin lets sugars go into cells in your body. If you have type 2 diabetes, sugars cannot get into your cells. Sugars build up in the blood. This causes high blood sugar. What are the causes? The exact cause of this condition is not known. What increases the risk? Having type 2 diabetes in your family. Being overweight or very overweight. Not being active. Your body not reacting in a normal way to the insulin it makes. Having higher than normal blood sugar over time. Having a type of diabetes when you were pregnant. Having a condition that causes small fluid-filled sacs on your ovaries. What are the signs or symptoms? At first, you may have no symptoms. You will get symptoms slowly. They may include: More thirst than normal. More hunger than normal. Needing to pee more than normal. Losing weight without trying. Feeling tired. Feeling weak. Seeing things blurry. Dark patches on your skin. How is this treated? This condition may be treated by a diabetes expert. You may need to: Follow an eating plan made by a food expert (dietitian). Get regular exercise. Find ways to deal with stress. Check blood sugar as often as told. Take medicines. Your doctor will set treatment goals for you. Your blood sugar should be at these levels: Before meals: 80-130 mg/dL (4.4-7.2 mmol/L). After meals: below 180 mg/dL (10 mmol/L). Over the last 2-3 months: less than 7%. Follow these instructions at home: Medicines Take your diabetes medicines or insulin every day. Take medicines as told to help you prevent other problems caused by this condition. You may need: Aspirin. Medicine to lower  cholesterol. Medicine to control blood pressure. Questions to ask your doctor Should I meet with a diabetes educator? What medicines do I need, and when should I take them? What will I need to treat my condition at home? When should I check my blood sugar? Where can I find a support group? Who can I call if I have questions? When is my next doctor visit? General instructions Take over-the-counter and prescription medicines only as told by your doctor. Keep all follow-up visits. Where to find more information For help and guidance and more information about diabetes, please go to: American Diabetes Association (ADA): www.diabetes.org American Association of Diabetes Care and Education Specialists (ADCES): www.diabeteseducator.org International Diabetes Federation (IDF): dconly.dk Contact a doctor if: Your blood sugar is at or above 240 mg/dL (86.6 mmol/L) for 2 days in a row. You have been sick for 2 days or more, and you are not getting better. You have had a fever for 2 days or more, and you are not getting better. You have any of these problems for more than 6 hours: You cannot eat or drink. You feel like you may vomit. You vomit. You have watery poop (diarrhea). Get help right away if: Your blood sugar is lower than 54 mg/dL (3 mmol/L). You feel mixed up (confused). You have trouble thinking clearly. You have trouble breathing. You have medium or large ketone levels in your pee. These symptoms may be an emergency. Get help right away. Call your local emergency services (911 in the [place] Do not wait to see if the symptoms will  go away. Do not drive yourself to the hospital. Summary Type 2 diabetes is a long-term disease. Your pancreas may not make enough insulin, or your body may not react in a normal way to insulin that it makes. This condition is treated with an eating plan, lifestyle changes, and medicines. Your doctor will set treatment goals for you. These will help  you keep your blood sugar in a healthy range. Keep all follow-up visits. This information is not intended to replace advice given to you by your health care provider. Make sure you discuss any questions you have with your health care provider. Document Revised: 04/07/2020 Document Reviewed: 04/07/2020 Elsevier Patient Education  2024 [employer].

## 2024-01-30 NOTE — Assessment & Plan Note (Signed)
 Chronic, encouraged to aim for at least 150 minutes of exercise per week.

## 2024-01-30 NOTE — Assessment & Plan Note (Signed)
 Chronic, well controlled. She will continue with losartan and metoprolol.  Encouraged to follow low sodium diet.

## 2024-01-30 NOTE — Assessment & Plan Note (Signed)
 Blood glucose well-controlled with readings 105-117 mg/dL. Current medications stable. Informed about Ozempic  dosing and storage. - LDL goal is less than 70 - Continue atorvastatin  20 mg daily except Sunday. - Continue losartan  50 mg daily for renal protection. - Continue Ozempic  0.5 mg weekly as prescribed. - Ensure Ozempic  is stored in the refrigerator and used as directed.

## 2024-01-31 ENCOUNTER — Other Ambulatory Visit: Payer: Self-pay

## 2024-01-31 DIAGNOSIS — R82998 Other abnormal findings in urine: Secondary | ICD-10-CM

## 2024-01-31 LAB — BMP8+EGFR
BUN/Creatinine Ratio: 18 (ref 12–28)
BUN: 15 mg/dL (ref 8–27)
CO2: 24 mmol/L (ref 20–29)
Calcium: 9.3 mg/dL (ref 8.7–10.3)
Chloride: 104 mmol/L (ref 96–106)
Creatinine, Ser: 0.82 mg/dL (ref 0.57–1.00)
Glucose: 64 mg/dL — ABNORMAL LOW (ref 70–99)
Potassium: 4.3 mmol/L (ref 3.5–5.2)
Sodium: 142 mmol/L (ref 134–144)
eGFR: 77 mL/min/1.73

## 2024-01-31 LAB — HEMOGLOBIN A1C
Est. average glucose Bld gHb Est-mCnc: 117 mg/dL
Hgb A1c MFr Bld: 5.7 % — ABNORMAL HIGH (ref 4.8–5.6)

## 2024-02-01 ENCOUNTER — Encounter: Payer: Self-pay | Admitting: Podiatry

## 2024-02-02 LAB — URINE CULTURE

## 2024-02-06 ENCOUNTER — Other Ambulatory Visit

## 2024-02-10 ENCOUNTER — Ambulatory Visit
Admission: RE | Admit: 2024-02-10 | Discharge: 2024-02-10 | Disposition: A | Discharge status: Home / Self Care | Source: Ambulatory Visit | Attending: Internal Medicine | Admitting: Internal Medicine

## 2024-02-10 DIAGNOSIS — Z1231 Encounter for screening mammogram for malignant neoplasm of breast: Secondary | ICD-10-CM

## 2024-02-14 ENCOUNTER — Ambulatory Visit: Admitting: Podiatry

## 2024-02-14 ENCOUNTER — Ambulatory Visit
Admission: RE | Admit: 2024-02-14 | Discharge: 2024-02-14 | Disposition: A | Source: Ambulatory Visit | Attending: Podiatry

## 2024-02-14 DIAGNOSIS — M76822 Posterior tibial tendinitis, left leg: Secondary | ICD-10-CM

## 2024-02-18 ENCOUNTER — Other Ambulatory Visit

## 2024-02-22 ENCOUNTER — Ambulatory Visit: Payer: Self-pay | Admitting: Podiatry

## 2024-02-28 ENCOUNTER — Ambulatory Visit: Admitting: Podiatry

## 2024-03-06 ENCOUNTER — Ambulatory Visit: Admitting: Podiatry

## 2024-04-10 ENCOUNTER — Ambulatory Visit: Admitting: Podiatry

## 2024-05-24 ENCOUNTER — Encounter: Payer: Self-pay | Admitting: Internal Medicine

## 2024-05-24 ENCOUNTER — Other Ambulatory Visit: Payer: Self-pay

## 2024-05-29 ENCOUNTER — Encounter: Payer: Self-pay | Admitting: Internal Medicine

## 2024-07-11 ENCOUNTER — Ambulatory Visit: Admitting: Podiatry

## 2024-08-15 ENCOUNTER — Ambulatory Visit: Payer: Self-pay
